# Patient Record
Sex: Female | Born: 1960
Health system: Southern US, Community
[De-identification: ages and names within clinical notes are randomized; demographics above are authoritative.]

## PROBLEM LIST (undated history)

## (undated) DIAGNOSIS — J40 Bronchitis, not specified as acute or chronic: Secondary | ICD-10-CM

## (undated) DIAGNOSIS — K219 Gastro-esophageal reflux disease without esophagitis: Secondary | ICD-10-CM

## (undated) DIAGNOSIS — E739 Lactose intolerance, unspecified: Secondary | ICD-10-CM

## (undated) DIAGNOSIS — K209 Esophagitis, unspecified without bleeding: Secondary | ICD-10-CM

## (undated) DIAGNOSIS — I10 Essential (primary) hypertension: Secondary | ICD-10-CM

## (undated) DIAGNOSIS — Z9889 Other specified postprocedural states: Secondary | ICD-10-CM

## (undated) DIAGNOSIS — R7303 Prediabetes: Secondary | ICD-10-CM

## (undated) DIAGNOSIS — K5900 Constipation, unspecified: Secondary | ICD-10-CM

## (undated) DIAGNOSIS — R112 Nausea with vomiting, unspecified: Secondary | ICD-10-CM

## (undated) DIAGNOSIS — E669 Obesity, unspecified: Secondary | ICD-10-CM

## (undated) DIAGNOSIS — F32A Depression, unspecified: Secondary | ICD-10-CM

## (undated) DIAGNOSIS — J45909 Unspecified asthma, uncomplicated: Secondary | ICD-10-CM

## (undated) DIAGNOSIS — M1990 Unspecified osteoarthritis, unspecified site: Secondary | ICD-10-CM

## (undated) DIAGNOSIS — M2550 Pain in unspecified joint: Secondary | ICD-10-CM

## (undated) DIAGNOSIS — R0602 Shortness of breath: Secondary | ICD-10-CM

## (undated) HISTORY — DX: Lactose intolerance, unspecified: E73.9

## (undated) HISTORY — DX: Pain in unspecified joint: M25.50

## (undated) HISTORY — PX: ANKLE ARTHROSCOPY: SUR85

## (undated) HISTORY — DX: Depression, unspecified: F32.A

## (undated) HISTORY — DX: Shortness of breath: R06.02

## (undated) HISTORY — DX: Constipation, unspecified: K59.00

## (undated) HISTORY — PX: BIOPSY BREAST: PRO8

---

## 2001-06-30 ENCOUNTER — Other Ambulatory Visit: Admission: RE | Admit: 2001-06-30 | Discharge: 2001-06-30 | Payer: Self-pay | Admitting: Obstetrics and Gynecology

## 2001-10-12 ENCOUNTER — Other Ambulatory Visit: Admission: RE | Admit: 2001-10-12 | Discharge: 2001-10-12 | Payer: Self-pay | Admitting: Obstetrics and Gynecology

## 2003-04-18 ENCOUNTER — Other Ambulatory Visit: Admission: RE | Admit: 2003-04-18 | Discharge: 2003-04-18 | Payer: Self-pay | Admitting: Obstetrics and Gynecology

## 2005-05-31 ENCOUNTER — Emergency Department (HOSPITAL_COMMUNITY): Admission: EM | Admit: 2005-05-31 | Discharge: 2005-05-31 | Payer: Self-pay | Admitting: Emergency Medicine

## 2015-01-06 ENCOUNTER — Other Ambulatory Visit (HOSPITAL_COMMUNITY): Payer: Self-pay | Admitting: Orthopedic Surgery

## 2015-01-09 ENCOUNTER — Other Ambulatory Visit (HOSPITAL_COMMUNITY): Payer: Self-pay | Admitting: Orthopedic Surgery

## 2015-01-09 ENCOUNTER — Encounter (HOSPITAL_COMMUNITY)
Admission: RE | Admit: 2015-01-09 | Discharge: 2015-01-09 | Disposition: A | Discharge status: Home / Self Care | Payer: 59 | Source: Ambulatory Visit | Attending: Orthopedic Surgery | Admitting: Orthopedic Surgery

## 2015-01-09 ENCOUNTER — Encounter (HOSPITAL_COMMUNITY): Payer: Self-pay

## 2015-01-09 DIAGNOSIS — M12571 Traumatic arthropathy, right ankle and foot: Secondary | ICD-10-CM | POA: Diagnosis not present

## 2015-01-09 DIAGNOSIS — E669 Obesity, unspecified: Secondary | ICD-10-CM | POA: Diagnosis not present

## 2015-01-09 DIAGNOSIS — M24171 Other articular cartilage disorders, right ankle: Secondary | ICD-10-CM | POA: Diagnosis not present

## 2015-01-09 DIAGNOSIS — I10 Essential (primary) hypertension: Secondary | ICD-10-CM | POA: Diagnosis not present

## 2015-01-09 DIAGNOSIS — M65871 Other synovitis and tenosynovitis, right ankle and foot: Secondary | ICD-10-CM | POA: Diagnosis not present

## 2015-01-09 DIAGNOSIS — M25571 Pain in right ankle and joints of right foot: Secondary | ICD-10-CM | POA: Diagnosis present

## 2015-01-09 DIAGNOSIS — M7751 Other enthesopathy of right foot: Secondary | ICD-10-CM | POA: Diagnosis not present

## 2015-01-09 DIAGNOSIS — J45909 Unspecified asthma, uncomplicated: Secondary | ICD-10-CM | POA: Diagnosis not present

## 2015-01-09 DIAGNOSIS — M25871 Other specified joint disorders, right ankle and foot: Secondary | ICD-10-CM | POA: Diagnosis not present

## 2015-01-09 DIAGNOSIS — Z87891 Personal history of nicotine dependence: Secondary | ICD-10-CM | POA: Diagnosis not present

## 2015-01-09 DIAGNOSIS — Z6841 Body Mass Index (BMI) 40.0 and over, adult: Secondary | ICD-10-CM | POA: Diagnosis not present

## 2015-01-09 HISTORY — DX: Esophagitis, unspecified without bleeding: K20.90

## 2015-01-09 HISTORY — DX: Other specified postprocedural states: R11.2

## 2015-01-09 HISTORY — DX: Bronchitis, not specified as acute or chronic: J40

## 2015-01-09 HISTORY — DX: Obesity, unspecified: E66.9

## 2015-01-09 HISTORY — DX: Other specified postprocedural states: Z98.890

## 2015-01-09 HISTORY — DX: Esophagitis, unspecified: K20.9

## 2015-01-09 HISTORY — DX: Essential (primary) hypertension: I10

## 2015-01-09 HISTORY — DX: Unspecified osteoarthritis, unspecified site: M19.90

## 2015-01-09 HISTORY — DX: Unspecified asthma, uncomplicated: J45.909

## 2015-01-09 LAB — COMPREHENSIVE METABOLIC PANEL
ALT: 26 U/L (ref 0–35)
AST: 23 U/L (ref 0–37)
Albumin: 3.5 g/dL (ref 3.5–5.2)
Alkaline Phosphatase: 76 U/L (ref 39–117)
Anion gap: 14 (ref 5–15)
BUN: 19 mg/dL (ref 6–23)
CO2: 24 mmol/L (ref 19–32)
Calcium: 9.6 mg/dL (ref 8.4–10.5)
Chloride: 104 mmol/L (ref 96–112)
Creatinine, Ser: 0.95 mg/dL (ref 0.50–1.10)
GFR calc Af Amer: 77 mL/min — ABNORMAL LOW (ref >90)
GFR calc non Af Amer: 67 mL/min — ABNORMAL LOW (ref >90)
Glucose, Bld: 96 mg/dL (ref 70–99)
Potassium: 4.1 mmol/L (ref 3.5–5.1)
Sodium: 142 mmol/L (ref 135–145)
Total Bilirubin: 0.4 mg/dL (ref 0.3–1.2)
Total Protein: 7 g/dL (ref 6.0–8.3)

## 2015-01-09 LAB — CBC
HCT: 40.7 % (ref 36.0–46.0)
Hemoglobin: 13.3 g/dL (ref 12.0–15.0)
MCH: 28 pg (ref 26.0–34.0)
MCHC: 32.7 g/dL (ref 30.0–36.0)
MCV: 85.7 fL (ref 78.0–100.0)
Platelets: 340 10*3/uL (ref 150–400)
RBC: 4.75 MIL/uL (ref 3.87–5.11)
RDW: 14.4 % (ref 11.5–15.5)
WBC: 11.1 10*3/uL — ABNORMAL HIGH (ref 4.0–10.5)

## 2015-01-09 LAB — APTT: aPTT: 32 seconds (ref 24–37)

## 2015-01-09 LAB — PROTIME-INR
INR: 1 (ref 0.00–1.49)
Prothrombin Time: 13.3 seconds (ref 11.6–15.2)

## 2015-01-09 NOTE — Progress Notes (Signed)
   01/09/15 1425  OBSTRUCTIVE SLEEP APNEA  Have you ever been diagnosed with sleep apnea through a sleep study? No  Do you snore loudly (loud enough to be heard through closed doors)?  0  Do you often feel tired, fatigued, or sleepy during the daytime? 0  Has anyone observed you stop breathing during your sleep? 0  Do you have, or are you being treated for high blood pressure? 1  BMI more than 35 kg/m2? 1  Age over 55 years old? 1  Neck circumference greater than 40 cm/16 inches? 1  Gender: 0

## 2015-01-09 NOTE — Pre-Procedure Instructions (Signed)
Sarah Dodson  01/09/2015   Your procedure is scheduled on:  Wednesday, April 6th   Report to [facility] Admitting at 8:30 AM.  Call this number if you have problems the morning of surgery: [phone]   Remember:   Do not eat food or drink liquids after midnight Tuesday.    Take these medicines the morning of surgery with A SIP OF WATER:    Do not wear jewelry, make-up or nail polish.  Do not wear lotions, powders, or perfumes. You may NOT wear deodorant the day of surgery.  Do not shave underarms & legs 48 hours prior to surgery.    Do not bring valuables to the hospital.  [facility] is not responsible for any belongings or valuables.               Contacts, dentures or bridgework may not be worn into surgery.  Leave suitcase in the car. After surgery it may be brought to your room.                 Patients discharged the day of surgery will not be allowed to drive home.   Name and phone number of your driver:    Special Instructions: "Preparing for Surgery" instruction sheet.   Please read over the following fact sheets that you were given: Pain Booklet, Coughing and Deep Breathing, MRSA Information and Surgical Site Infection Prevention

## 2015-01-10 MED ORDER — CEFAZOLIN SODIUM-DEXTROSE 2-3 GM-% IV SOLR
2.0000 g | INTRAVENOUS | Status: AC
  Administered 2015-01-11: 2 g via INTRAVENOUS
  Filled 2015-01-10: qty 50

## 2015-01-11 ENCOUNTER — Ambulatory Visit (HOSPITAL_COMMUNITY): Payer: 59 | Admitting: Anesthesiology

## 2015-01-11 ENCOUNTER — Encounter (HOSPITAL_COMMUNITY): Payer: Self-pay | Admitting: *Deleted

## 2015-01-11 ENCOUNTER — Ambulatory Visit (HOSPITAL_COMMUNITY)
Admission: RE | Admit: 2015-01-11 | Discharge: 2015-01-11 | Disposition: A | Discharge status: Home / Self Care | Payer: 59 | Source: Ambulatory Visit | Attending: Orthopedic Surgery | Admitting: Orthopedic Surgery

## 2015-01-11 ENCOUNTER — Encounter (HOSPITAL_COMMUNITY)
Admission: RE | Disposition: A | Discharge status: Home / Self Care | Payer: Self-pay | Source: Ambulatory Visit | Attending: Orthopedic Surgery

## 2015-01-11 DIAGNOSIS — M12571 Traumatic arthropathy, right ankle and foot: Secondary | ICD-10-CM | POA: Insufficient documentation

## 2015-01-11 DIAGNOSIS — I10 Essential (primary) hypertension: Secondary | ICD-10-CM | POA: Insufficient documentation

## 2015-01-11 DIAGNOSIS — M7751 Other enthesopathy of right foot: Secondary | ICD-10-CM | POA: Insufficient documentation

## 2015-01-11 DIAGNOSIS — M19071 Primary osteoarthritis, right ankle and foot: Secondary | ICD-10-CM

## 2015-01-11 DIAGNOSIS — Z6841 Body Mass Index (BMI) 40.0 and over, adult: Secondary | ICD-10-CM | POA: Insufficient documentation

## 2015-01-11 DIAGNOSIS — M24171 Other articular cartilage disorders, right ankle: Secondary | ICD-10-CM | POA: Insufficient documentation

## 2015-01-11 DIAGNOSIS — M25871 Other specified joint disorders, right ankle and foot: Secondary | ICD-10-CM | POA: Insufficient documentation

## 2015-01-11 DIAGNOSIS — Z87891 Personal history of nicotine dependence: Secondary | ICD-10-CM | POA: Insufficient documentation

## 2015-01-11 DIAGNOSIS — J45909 Unspecified asthma, uncomplicated: Secondary | ICD-10-CM | POA: Insufficient documentation

## 2015-01-11 DIAGNOSIS — M65871 Other synovitis and tenosynovitis, right ankle and foot: Secondary | ICD-10-CM | POA: Insufficient documentation

## 2015-01-11 DIAGNOSIS — E669 Obesity, unspecified: Secondary | ICD-10-CM | POA: Insufficient documentation

## 2015-01-11 HISTORY — PX: ANKLE ARTHROSCOPY: SHX545

## 2015-01-11 LAB — SURGICAL PCR SCREEN
MRSA, PCR: NEGATIVE
Staphylococcus aureus: NEGATIVE

## 2015-01-11 SURGERY — ARTHROSCOPY, ANKLE
Anesthesia: General | Laterality: Right

## 2015-01-11 MED ORDER — PROPOFOL 10 MG/ML IV BOLUS
INTRAVENOUS | Status: AC
  Filled 2015-01-11: qty 20

## 2015-01-11 MED ORDER — FENTANYL CITRATE 0.05 MG/ML IJ SOLN
INTRAMUSCULAR | Status: DC | PRN
  Administered 2015-01-11 (×2): 50 ug via INTRAVENOUS
  Administered 2015-01-11: 100 ug via INTRAVENOUS

## 2015-01-11 MED ORDER — ONDANSETRON HCL 4 MG/2ML IJ SOLN
INTRAMUSCULAR | Status: AC
  Filled 2015-01-11: qty 2

## 2015-01-11 MED ORDER — ARTIFICIAL TEARS OP OINT
TOPICAL_OINTMENT | OPHTHALMIC | Status: AC
  Filled 2015-01-11: qty 3.5

## 2015-01-11 MED ORDER — MIDAZOLAM HCL 2 MG/2ML IJ SOLN
INTRAMUSCULAR | Status: AC
  Administered 2015-01-11: 2 mg
  Filled 2015-01-11: qty 2

## 2015-01-11 MED ORDER — FENTANYL CITRATE 0.05 MG/ML IJ SOLN: 25.0000 ug | INTRAMUSCULAR | Status: DC | PRN

## 2015-01-11 MED ORDER — BUPIVACAINE HCL (PF) 0.25 % IJ SOLN
INTRAMUSCULAR | Status: AC
  Filled 2015-01-11: qty 30

## 2015-01-11 MED ORDER — SODIUM CHLORIDE 0.9 % IJ SOLN
INTRAMUSCULAR | Status: AC
  Filled 2015-01-11: qty 10

## 2015-01-11 MED ORDER — LIDOCAINE HCL (CARDIAC) 20 MG/ML IV SOLN
INTRAVENOUS | Status: DC | PRN
  Administered 2015-01-11: 50 mg via INTRAVENOUS

## 2015-01-11 MED ORDER — KETOROLAC TROMETHAMINE 30 MG/ML IJ SOLN
30.0000 mg | Freq: Once | INTRAMUSCULAR | Status: AC | PRN
  Administered 2015-01-11: 30 mg via INTRAVENOUS

## 2015-01-11 MED ORDER — DEXAMETHASONE SODIUM PHOSPHATE 10 MG/ML IJ SOLN
INTRAMUSCULAR | Status: DC | PRN
  Administered 2015-01-11: 10 mg via INTRAVENOUS

## 2015-01-11 MED ORDER — SUCCINYLCHOLINE CHLORIDE 20 MG/ML IJ SOLN
INTRAMUSCULAR | Status: AC
  Filled 2015-01-11: qty 1

## 2015-01-11 MED ORDER — SODIUM CHLORIDE 0.9 % IR SOLN
Status: DC | PRN
  Administered 2015-01-11 (×2): 3000 mL

## 2015-01-11 MED ORDER — LACTATED RINGERS IV SOLN
INTRAVENOUS | Status: DC
  Administered 2015-01-11: 10:00:00 via INTRAVENOUS

## 2015-01-11 MED ORDER — KETOROLAC TROMETHAMINE 30 MG/ML IJ SOLN
INTRAMUSCULAR | Status: AC
  Filled 2015-01-11: qty 1

## 2015-01-11 MED ORDER — SUCCINYLCHOLINE CHLORIDE 20 MG/ML IJ SOLN
INTRAMUSCULAR | Status: DC | PRN
  Administered 2015-01-11: 120 mg via INTRAVENOUS

## 2015-01-11 MED ORDER — MIDAZOLAM HCL 2 MG/2ML IJ SOLN
INTRAMUSCULAR | Status: AC
  Filled 2015-01-11: qty 2

## 2015-01-11 MED ORDER — ONDANSETRON HCL 4 MG/2ML IJ SOLN: 4.0000 mg | Freq: Once | INTRAMUSCULAR | Status: DC | PRN

## 2015-01-11 MED ORDER — OXYCODONE HCL 5 MG/5ML PO SOLN: 5.0000 mg | Freq: Once | ORAL | Status: DC | PRN

## 2015-01-11 MED ORDER — PROPOFOL 10 MG/ML IV BOLUS
INTRAVENOUS | Status: DC | PRN
  Administered 2015-01-11: 200 mg via INTRAVENOUS
  Administered 2015-01-11: 100 mg via INTRAVENOUS

## 2015-01-11 MED ORDER — EPHEDRINE SULFATE 50 MG/ML IJ SOLN
INTRAMUSCULAR | Status: AC
  Filled 2015-01-11: qty 1

## 2015-01-11 MED ORDER — ONDANSETRON HCL 4 MG/2ML IJ SOLN
INTRAMUSCULAR | Status: DC | PRN
  Administered 2015-01-11: 4 mg via INTRAVENOUS

## 2015-01-11 MED ORDER — MUPIROCIN 2 % EX OINT
1.0000 "application " | TOPICAL_OINTMENT | Freq: Once | CUTANEOUS | Status: AC
  Administered 2015-01-11: 1 via TOPICAL
  Filled 2015-01-11: qty 22

## 2015-01-11 MED ORDER — LACTATED RINGERS IV SOLN
INTRAVENOUS | Status: DC | PRN
  Administered 2015-01-11 (×2): via INTRAVENOUS

## 2015-01-11 MED ORDER — OXYCODONE HCL 5 MG PO TABS: 5.0000 mg | ORAL_TABLET | Freq: Once | ORAL | Status: DC | PRN

## 2015-01-11 MED ORDER — FENTANYL CITRATE 0.05 MG/ML IJ SOLN
INTRAMUSCULAR | Status: AC
  Administered 2015-01-11: 100 ug
  Filled 2015-01-11: qty 2

## 2015-01-11 MED ORDER — FENTANYL CITRATE 0.05 MG/ML IJ SOLN
INTRAMUSCULAR | Status: AC
  Filled 2015-01-11: qty 2

## 2015-01-11 MED ORDER — LIDOCAINE HCL (CARDIAC) 20 MG/ML IV SOLN
INTRAVENOUS | Status: AC
  Filled 2015-01-11: qty 5

## 2015-01-11 MED ORDER — FENTANYL CITRATE 0.05 MG/ML IJ SOLN
INTRAMUSCULAR | Status: AC
  Filled 2015-01-11: qty 5

## 2015-01-11 SURGICAL SUPPLY — 44 items
BLADE CUDA 5.5 (BLADE) IMPLANT
BLADE GREAT WHITE 4.2 (BLADE) IMPLANT
BLADE INCISOR PLUS 5.5 (BLADE) IMPLANT
BNDG COHESIVE 6X5 TAN STRL LF (GAUZE/BANDAGES/DRESSINGS) ×2 IMPLANT
BNDG GAUZE ELAST 4 BULKY (GAUZE/BANDAGES/DRESSINGS) ×2 IMPLANT
BUR OVAL 6.0 (BURR) IMPLANT
DRAPE ARTHROSCOPY W/POUCH 114 (DRAPES) ×2 IMPLANT
DRAPE U-SHAPE 47X51 STRL (DRAPES) ×2 IMPLANT
DRSG ADAPTIC 3X8 NADH LF (GAUZE/BANDAGES/DRESSINGS) ×2 IMPLANT
DRSG EMULSION OIL 3X3 NADH (GAUZE/BANDAGES/DRESSINGS) ×2 IMPLANT
DRSG PAD ABDOMINAL 8X10 ST (GAUZE/BANDAGES/DRESSINGS) ×2 IMPLANT
DURAPREP 26ML APPLICATOR (WOUND CARE) ×2 IMPLANT
GAUZE SPONGE 4X4 12PLY STRL (GAUZE/BANDAGES/DRESSINGS) ×2 IMPLANT
GLOVE BIOGEL PI IND STRL 6.5 (GLOVE) ×1 IMPLANT
GLOVE BIOGEL PI IND STRL 9 (GLOVE) ×1 IMPLANT
GLOVE BIOGEL PI INDICATOR 6.5 (GLOVE) ×1
GLOVE BIOGEL PI INDICATOR 9 (GLOVE) ×1
GLOVE SURG ORTHO 9.0 STRL STRW (GLOVE) ×2 IMPLANT
GLOVE SURG SS PI 6.5 STRL IVOR (GLOVE) ×2 IMPLANT
GOWN STRL REUS W/ TWL LRG LVL3 (GOWN DISPOSABLE) ×2 IMPLANT
GOWN STRL REUS W/ TWL XL LVL3 (GOWN DISPOSABLE) ×3 IMPLANT
GOWN STRL REUS W/TWL LRG LVL3 (GOWN DISPOSABLE) ×4
GOWN STRL REUS W/TWL XL LVL3 (GOWN DISPOSABLE) ×6
KIT BASIN OR (CUSTOM PROCEDURE TRAY) ×2 IMPLANT
KIT ROOM TURNOVER OR (KITS) ×2 IMPLANT
MANIFOLD NEPTUNE II (INSTRUMENTS) ×2 IMPLANT
NEEDLE 18GX1X1/2 (RX/OR ONLY) (NEEDLE) ×2 IMPLANT
PACK ARTHROSCOPY DSU (CUSTOM PROCEDURE TRAY) ×2 IMPLANT
PAD ARMBOARD 7.5X6 YLW CONV (MISCELLANEOUS) ×4 IMPLANT
PADDING CAST COTTON 6X4 STRL (CAST SUPPLIES) ×2 IMPLANT
SET ARTHROSCOPY TUBING (MISCELLANEOUS) ×2
SET ARTHROSCOPY TUBING LN (MISCELLANEOUS) ×1 IMPLANT
SPONGE LAP 4X18 X RAY DECT (DISPOSABLE) ×2 IMPLANT
SUT ETHILON 4 0 PS 2 18 (SUTURE) ×2 IMPLANT
SUT MNCRL AB 3-0 PS2 18 (SUTURE) IMPLANT
SUT VIC AB 2-0 CT1 27 (SUTURE)
SUT VIC AB 2-0 CT1 TAPERPNT 27 (SUTURE) IMPLANT
SYR 20CC LL (SYRINGE) ×2 IMPLANT
TAPE STRIPS DRAPE STRL (GAUZE/BANDAGES/DRESSINGS) IMPLANT
TOWEL OR 17X24 6PK STRL BLUE (TOWEL DISPOSABLE) ×2 IMPLANT
TOWEL OR 17X26 10 PK STRL BLUE (TOWEL DISPOSABLE) ×2 IMPLANT
WAND 30 DEG SABER W/CORD (SURGICAL WAND) IMPLANT
WATER STERILE IRR 1000ML POUR (IV SOLUTION) ×2 IMPLANT
WRAP KNEE MAXI GEL POST OP (GAUZE/BANDAGES/DRESSINGS) ×2 IMPLANT

## 2015-01-11 NOTE — Anesthesia Postprocedure Evaluation (Signed)
  Anesthesia Post-op Note  Patient: Sarah Dodson  Procedure(s) Performed: Procedure(s): RIGHT ANKLE ARTHROSCOPY (Right)  Patient Location: PACU  Anesthesia Type:General and GA combined with regional for post-op pain  Level of Consciousness: awake, alert  and oriented  Airway and Oxygen Therapy: Patient Spontanous Breathing and Patient connected to nasal cannula oxygen  Post-op Pain: none  Post-op Assessment: Post-op Vital signs reviewed, Patient's Cardiovascular Status Stable, Respiratory Function Stable, Patent Airway and Pain level controlled  Post-op Vital Signs: stable  Last Vitals:  Filed Vitals:   01/11/15 1145  BP: 143/65  Pulse: 93  Temp:   Resp: 20    Complications: No apparent anesthesia complications

## 2015-01-11 NOTE — H&P (Signed)
Sarah Dodson is an 54 y.o. female.   Chief Complaint: Traumatic arthritic pain right ankle HPI: Patient is a 54 year old woman with traumatic arthritic pain of her right ankle she has failed conservative therapy and presents at this time for arthroscopic intervention.  Past Medical History  Diagnosis Date  . PONV (postoperative nausea and vomiting)     nausea with the LEFT ankle surgery  . Asthma   . Esophagitis   . Arthritis   . Hypertension   . Obesity   . Bronchitis     Past Surgical History  Procedure Laterality Date  . Ankle arthroscopy      2014    No family history on file. Social History:  reports that she quit smoking about 6 years ago. She does not have any smokeless tobacco history on file. She reports that she does not drink alcohol or use illicit drugs.  Allergies:  Allergies  Allergen Reactions  . Latex Itching    No prescriptions prior to admission    Results for orders placed or performed during the hospital encounter of 01/09/15 (from the past 48 hour(s))  APTT     Status: None   Collection Time: 01/09/15  2:53 PM  Result Value Ref Range   aPTT 32 24 - 37 seconds  CBC     Status: Abnormal   Collection Time: 01/09/15  2:53 PM  Result Value Ref Range   WBC 11.1 (H) 4.0 - 10.5 K/uL   RBC 4.75 3.87 - 5.11 MIL/uL   Hemoglobin 13.3 12.0 - 15.0 g/dL   HCT 40.7 36.0 - 46.0 %   MCV 85.7 78.0 - 100.0 fL   MCH 28.0 26.0 - 34.0 pg   MCHC 32.7 30.0 - 36.0 g/dL   RDW 14.4 11.5 - 15.5 %   Platelets 340 150 - 400 K/uL  Comprehensive metabolic panel     Status: Abnormal   Collection Time: 01/09/15  2:53 PM  Result Value Ref Range   Sodium 142 135 - 145 mmol/L   Potassium 4.1 3.5 - 5.1 mmol/L   Chloride 104 96 - 112 mmol/L   CO2 24 19 - 32 mmol/L   Glucose, Bld 96 70 - 99 mg/dL   BUN 19 6 - 23 mg/dL   Creatinine, Ser 0.95 0.50 - 1.10 mg/dL   Calcium 9.6 8.4 - 10.5 mg/dL   Total Protein 7.0 6.0 - 8.3 g/dL   Albumin 3.5 3.5 - 5.2 g/dL   AST 23 0 - 37 U/L    ALT 26 0 - 35 U/L   Alkaline Phosphatase 76 39 - 117 U/L   Total Bilirubin 0.4 0.3 - 1.2 mg/dL   GFR calc non Af Amer 67 (L) >90 mL/min   GFR calc Af Amer 77 (L) >90 mL/min    Comment: (NOTE) The eGFR has been calculated using the CKD EPI equation. This calculation has not been validated in all clinical situations. eGFR's persistently <90 mL/min signify possible Chronic Kidney Disease.    Anion gap 14 5 - 15  Protime-INR     Status: None   Collection Time: 01/09/15  2:53 PM  Result Value Ref Range   Prothrombin Time 13.3 11.6 - 15.2 seconds   INR 1.00 0.00 - 1.49   No results found.  Review of Systems  All other systems reviewed and are negative.   There were no vitals taken for this visit. Physical Exam  On examination patient has a palpable pulse. She has pain to palpation anteriorly  over the ankle. Maximum plantarflexion and dorsiflexion of the ankle reproduces pain. Assessment/Plan Assessment: Traumatic arthritis right ankle.  Plan: We will plan for right ankle arthroscopy. Risks and benefits were discussed including infection neurovascular injury persistent pain and need for additional surgery. Patient states she understands and wishes to proceed at this time.  Sarah Dodson V 01/11/2015, 6:49 AM

## 2015-01-11 NOTE — Anesthesia Preprocedure Evaluation (Addendum)
Anesthesia Evaluation  Patient identified by MRN, date of birth, ID band Patient awake    Reviewed: Allergy & Precautions, H&P , NPO status , Patient's Chart, lab work & pertinent test results  History of Anesthesia Complications (+) PONV  Airway Mallampati: II  TM Distance: >3 FB Neck ROM: full    Dental  (+) Teeth Intact, Dental Advidsory Given   Pulmonary asthma , former smoker,          Cardiovascular hypertension, Rhythm:Regular Rate:Normal     Neuro/Psych    GI/Hepatic   Endo/Other    Renal/GU      Musculoskeletal   Abdominal (+) + obese,   Peds  Hematology   Anesthesia Other Findings   Reproductive/Obstetrics                            Anesthesia Physical Anesthesia Plan  ASA: III  Anesthesia Plan: General   Post-op Pain Management: MAC Combined w/ Regional for Post-op pain   Induction: Intravenous  Airway Management Planned: Oral ETT  Additional Equipment:   Intra-op Plan:   Post-operative Plan:   Informed Consent: I have reviewed the patients History and Physical, chart, labs and discussed the procedure including the risks, benefits and alternatives for the proposed anesthesia with the patient or authorized representative who has indicated his/her understanding and acceptance.   Dental Advisory Given  Plan Discussed with: Anesthesiologist and Surgeon  Anesthesia Plan Comments:        Anesthesia Quick Evaluation

## 2015-01-11 NOTE — Op Note (Signed)
01/11/2015  10:55 AM  PATIENT:  Sarah Dodson    PRE-OPERATIVE DIAGNOSIS:  Impingement Right Ankle  POST-OPERATIVE DIAGNOSIS:  Same  PROCEDURE:  RIGHT ANKLE ARTHROSCOPY  SURGEON:  Newt Minion, MD  PHYSICIAN ASSISTANT:None ANESTHESIA:   General  PREOPERATIVE INDICATIONS:  Sarah Dodson is a  54 y.o. female with a diagnosis of Impingement Right Ankle who failed conservative measures and elected for surgical management.    The risks benefits and alternatives were discussed with the patient preoperatively including but not limited to the risks of infection, bleeding, nerve injury, cardiopulmonary complications, the need for revision surgery, among others, and the patient was willing to proceed.  OPERATIVE IMPLANTS: None  OPERATIVE FINDINGS: Traumatic arthritis with large osteophytic bone spurs  OPERATIVE PROCEDURE: Patient is a 54 year old woman with traumatic arthritis right ankle with impingement syndrome she has failed conservative care and presents at this time for arthroscopic intervention. Risks and benefits were discussed including infection neurovascular injury persistent pain and need for additional surgery. Patient states she understands and wishes to proceed at this time. Patient was brought to the operating room after undergoing a popliteal block she then underwent a general anesthetic. After adequate levels of anesthesia were obtained patient's right lower extremity was prepped using DuraPrep draped into a sterile field. A timeout was called. A arthroscopic portal was made anterior medially and a working portal was made anterior laterally. Blunt dissection was carried down to the capsule and a blunt trocar was used to insert into the joint. Arthroscopic findings showed large osteophytic bone spurs anteriorly with significant synovitis. Patient underwent synovectomy with both the shaver and the vapor wand. The bony spurs were resected with the shaver. Patient did have osteochondral  changes of the tibial talar joint. Survey of all compartments was performed and there are no other abnormalities. The incidence remove the portals were closed using 3-0 nylon. A sterile compressive dressing was applied. Patient was extubated taken to the PACU in stable condition. Plan for discharge to home follow-up in the office in 2 weeks

## 2015-01-11 NOTE — Transfer of Care (Signed)
Immediate Anesthesia Transfer of Care Note  Patient: Sarah Dodson  Procedure(s) Performed: Procedure(s): RIGHT ANKLE ARTHROSCOPY (Right)  Patient Location: PACU  Anesthesia Type:General  Level of Consciousness: awake, alert  and oriented  Airway & Oxygen Therapy: Patient Spontanous Breathing and Patient connected to face mask oxygen  Post-op Assessment: Report given to RN, Post -op Vital signs reviewed and stable and Patient moving all extremities X 4  Post vital signs: Reviewed and stable  Last Vitals:  Filed Vitals:   01/11/15 0918  BP: 139/68  Pulse: 85  Temp: 36.9 C  Resp: 18    Complications: No apparent anesthesia complications

## 2015-01-11 NOTE — Progress Notes (Signed)
Orthopedic Tech Progress Note Patient Details:  Sarah Dodson 1961/04/23 [id number]  Ortho Devices Type of Ortho Device: Postop shoe/boot Ortho Device/Splint Location: rle Ortho Device/Splint Interventions: Application  Viewed order from doctor's order list Hildred Priest 01/11/2015, 11:53 AM

## 2015-01-11 NOTE — Anesthesia Procedure Notes (Addendum)
Procedure Name: Intubation Date/Time: 01/11/2015 10:11 AM Performed by: Neldon Newport Pre-anesthesia Checklist: Timeout performed, Patient being monitored, Suction available, Emergency Drugs available and Patient identified Patient Re-evaluated:Patient Re-evaluated prior to inductionOxygen Delivery Method: Circle system utilized Preoxygenation: Pre-oxygenation with 100% oxygen Intubation Type: IV induction and Rapid sequence Ventilation: Mask ventilation without difficulty Laryngoscope Size: Mac and 3 Grade View: Grade I Tube type: Oral Tube size: 7.0 mm Number of attempts: 1 Secured at: 21 cm Tube secured with: Tape Dental Injury: Teeth and Oropharynx as per pre-operative assessment    Anesthesia Regional Block:  Popliteal block  Pre-Anesthetic Checklist: ,, timeout performed, Correct Patient, Correct Site, Correct Laterality, Correct Procedure, Correct Position, site marked, Risks and benefits discussed,  Surgical consent,  Pre-op evaluation,  At surgeon's request and post-op pain management  Laterality: Right  Prep: chloraprep       Needles:  Injection technique: Single-shot  Needle Type: Echogenic Stimulator Needle     Needle Length: 9cm 9 cm Needle Gauge: 22 and 22 G    Additional Needles:  Procedures: ultrasound guided (picture in chart) and nerve stimulator Popliteal block Narrative:  Start time: 01/11/2015 9:45 AM End time: 01/11/2015 9:50 AM Injection made incrementally with aspirations every 5 mL.  Performed by: Personally   Additional Notes: 30 cc 0.5% Bupivacaine with 1:200 epinephrine injected easily

## 2015-01-12 ENCOUNTER — Encounter (HOSPITAL_COMMUNITY): Payer: Self-pay | Admitting: Orthopedic Surgery

## 2016-07-22 ENCOUNTER — Ambulatory Visit (INDEPENDENT_AMBULATORY_CARE_PROVIDER_SITE_OTHER): Payer: 59 | Admitting: Orthopedic Surgery

## 2016-07-22 DIAGNOSIS — M19071 Primary osteoarthritis, right ankle and foot: Secondary | ICD-10-CM

## 2016-07-22 DIAGNOSIS — M7541 Impingement syndrome of right shoulder: Secondary | ICD-10-CM

## 2016-07-22 DIAGNOSIS — M1711 Unilateral primary osteoarthritis, right knee: Secondary | ICD-10-CM | POA: Diagnosis not present

## 2016-07-30 ENCOUNTER — Other Ambulatory Visit (INDEPENDENT_AMBULATORY_CARE_PROVIDER_SITE_OTHER): Payer: Self-pay | Admitting: Orthopedic Surgery

## 2016-08-07 ENCOUNTER — Other Ambulatory Visit: Payer: Self-pay | Admitting: Obstetrics and Gynecology

## 2016-08-07 DIAGNOSIS — R928 Other abnormal and inconclusive findings on diagnostic imaging of breast: Secondary | ICD-10-CM

## 2016-08-09 ENCOUNTER — Ambulatory Visit
Admission: RE | Admit: 2016-08-09 | Discharge: 2016-08-09 | Disposition: A | Discharge status: Home / Self Care | Payer: 59 | Source: Ambulatory Visit | Attending: Obstetrics and Gynecology | Admitting: Obstetrics and Gynecology

## 2016-08-09 ENCOUNTER — Other Ambulatory Visit: Payer: Self-pay | Admitting: Obstetrics and Gynecology

## 2016-08-09 DIAGNOSIS — R928 Other abnormal and inconclusive findings on diagnostic imaging of breast: Secondary | ICD-10-CM

## 2016-08-09 DIAGNOSIS — N6310 Unspecified lump in the right breast, unspecified quadrant: Secondary | ICD-10-CM

## 2016-08-13 NOTE — H&P (Signed)
Sarah Dodson  DICTATION # [id number] CSN# [id number]   Margarette Asal, MD 08/13/2016 11:26 AM

## 2016-08-14 NOTE — H&P (Signed)
Sarah Dodson, Sarah Dodson               ACCOUNT NO.:  [id number]  MEDICAL RECORD NO.:  [id number]  LOCATION:                                 FACILITY:  PHYSICIAN:  Ralene Bathe. Matthew Saras, M.D.DATE OF BIRTH:  [birth year]  DATE OF ADMISSION: DATE OF DISCHARGE:                             HISTORY & PHYSICAL   CHIEF COMPLAINT:  Postmenopausal bleeding.  HISTORY OF PRESENT ILLNESS:  A 55 year old, postmenopausal, G2, P0, who was seen in October of this year for her annual exam thinking that she was having some rectal bleeding from hemorrhoid, small amount of blood was noted at the cervical os.  So, further evaluation was carried out in the form of sonohysterogram.  Visualization was difficult somewhat due to her weight of 335 pounds.  It did, however, looked like there was some buildup of the endometrium, possibly a small polyp noted.  She presents at this time for outpatient D and C, hysteroscopy.  This procedure including specific risks related to bleeding, infection, adjacent organ injury, the possible need for open additional surgery reviewed with her.  PAST MEDICAL HISTORY:  ALLERGIES:  Latex.  CURRENT MEDICATIONS:  Triamterene/HCTZ, diclofenac daily, Advair daily.  SURGICAL HISTORY:  She has had arthroscopy on both ankles.  REVIEW OF SYSTEMS:  Significant for history of chlamydia treated in the past.  She has a history of positive titers for HSV 1 and 2.  FAMILY HISTORY:  Significant for asthma, arthritis, hypertension.  SOCIAL HISTORY:  She is divorced.  Denies alcohol, tobacco, or drug use.  Sarah Dodson is her PCP.  Last Pap dated October 17 was negative except for possible BV.  PHYSICAL EXAMINATION:  VITAL SIGNS:  Temp 98.2, blood pressure 130/80. Weight is 335. HEENT:  Unremarkable. NECK:  Supple.  No masses. LUNGS:  Clear. CARDIOVASCULAR:  Regular rate and rhythm without murmurs, rubs, or gallops noted. BREASTS:  Without masses. ABDOMEN:  Soft, flat,  nontender. PELVIC:  Vulva, vagina, cervix normal except for small amount of blood as noted previously.  Exam difficult due to her weight, but no obvious masses. EXTREMITIES:  Unremarkable. NEUROLOGIC:  Unremarkable.  IMPRESSION:  Postmenopausal bleeding, endometrial buildup noted on SHG.  PLAN:  D and C, hysteroscopy.  Procedure and risks reviewed as above.     Sarah Dodson M. Matthew Saras, M.D.   ______________________________ Ralene Bathe. Matthew Saras, M.D.    [facility]/MEDQ  D:  08/13/2016  T:  08/14/2016  Job:  [id number]

## 2016-08-15 ENCOUNTER — Ambulatory Visit
Admission: RE | Admit: 2016-08-15 | Discharge: 2016-08-15 | Disposition: A | Discharge status: Home / Self Care | Payer: 59 | Source: Ambulatory Visit | Attending: Obstetrics and Gynecology | Admitting: Obstetrics and Gynecology

## 2016-08-15 ENCOUNTER — Other Ambulatory Visit: Payer: Self-pay | Admitting: Obstetrics and Gynecology

## 2016-08-15 DIAGNOSIS — N6310 Unspecified lump in the right breast, unspecified quadrant: Secondary | ICD-10-CM

## 2016-08-19 ENCOUNTER — Ambulatory Visit (INDEPENDENT_AMBULATORY_CARE_PROVIDER_SITE_OTHER): Payer: 59 | Admitting: Orthopedic Surgery

## 2016-08-28 ENCOUNTER — Encounter (HOSPITAL_COMMUNITY)
Admission: RE | Admit: 2016-08-28 | Discharge: 2016-08-28 | Disposition: A | Discharge status: Home / Self Care | Payer: 59 | Source: Ambulatory Visit | Attending: Obstetrics and Gynecology | Admitting: Obstetrics and Gynecology

## 2016-08-28 ENCOUNTER — Encounter (HOSPITAL_COMMUNITY): Payer: Self-pay

## 2016-08-28 ENCOUNTER — Other Ambulatory Visit: Payer: Self-pay

## 2016-08-28 DIAGNOSIS — Z01812 Encounter for preprocedural laboratory examination: Secondary | ICD-10-CM | POA: Insufficient documentation

## 2016-08-28 DIAGNOSIS — K649 Unspecified hemorrhoids: Secondary | ICD-10-CM | POA: Insufficient documentation

## 2016-08-28 DIAGNOSIS — Z79899 Other long term (current) drug therapy: Secondary | ICD-10-CM | POA: Insufficient documentation

## 2016-08-28 DIAGNOSIS — Z888 Allergy status to other drugs, medicaments and biological substances status: Secondary | ICD-10-CM | POA: Diagnosis not present

## 2016-08-28 DIAGNOSIS — Z01810 Encounter for preprocedural cardiovascular examination: Secondary | ICD-10-CM | POA: Insufficient documentation

## 2016-08-28 DIAGNOSIS — N950 Postmenopausal bleeding: Secondary | ICD-10-CM | POA: Insufficient documentation

## 2016-08-28 LAB — CBC
HCT: 40.1 % (ref 36.0–46.0)
Hemoglobin: 13.3 g/dL (ref 12.0–15.0)
MCH: 28.7 pg (ref 26.0–34.0)
MCHC: 33.2 g/dL (ref 30.0–36.0)
MCV: 86.6 fL (ref 78.0–100.0)
Platelets: 465 10*3/uL — ABNORMAL HIGH (ref 150–400)
RBC: 4.63 MIL/uL (ref 3.87–5.11)
RDW: 14.8 % (ref 11.5–15.5)
WBC: 15.4 10*3/uL — ABNORMAL HIGH (ref 4.0–10.5)

## 2016-08-28 LAB — COMPREHENSIVE METABOLIC PANEL
ALT: 27 U/L (ref 14–54)
AST: 25 U/L (ref 15–41)
Albumin: 3.7 g/dL (ref 3.5–5.0)
Alkaline Phosphatase: 89 U/L (ref 38–126)
Anion gap: 10 (ref 5–15)
BUN: 16 mg/dL (ref 6–20)
CO2: 27 mmol/L (ref 22–32)
Calcium: 9.2 mg/dL (ref 8.9–10.3)
Chloride: 99 mmol/L — ABNORMAL LOW (ref 101–111)
Creatinine, Ser: 0.86 mg/dL (ref 0.44–1.00)
GFR calc Af Amer: >60 mL/min (ref >60)
GFR calc non Af Amer: >60 mL/min (ref >60)
Glucose, Bld: 141 mg/dL — ABNORMAL HIGH (ref 65–99)
Potassium: 4.1 mmol/L (ref 3.5–5.1)
Sodium: 136 mmol/L (ref 135–145)
Total Bilirubin: 0.5 mg/dL (ref 0.3–1.2)
Total Protein: 7.3 g/dL (ref 6.5–8.1)

## 2016-08-28 LAB — TYPE AND SCREEN
ABO/RH(D): O POS
Antibody Screen: NEGATIVE

## 2016-08-28 LAB — ABO/RH: ABO/RH(D): O POS

## 2016-08-28 NOTE — Patient Instructions (Addendum)
Your procedure is scheduled on: Thursday September 05, 2016  Enter through the [employer] [facility] at: 11:30  Pick up the phone at the desk and dial [phone].  Call this number if you have problems the morning of surgery: [phone].  Remember: Do NOT eat food: After midnight  on Wednesday November 29 Do NOT drink clear liquids (including water) after: 9AM ON Thursday November 30  Take these medicines the morning of surgery with a SIP OF WATER: Dyazide (Triamterene)  BRING ALBUTEROL INHALER WITH YOU DAY OF SURGERY  Do NOT wear jewelry (body piercing), metal hair clips/bobby pins, make-up, or nail polish. Do NOT wear lotions, powders, or perfumes.  You may wear deoderant. Do NOT shave for 48 hours prior to surgery. Do NOT bring valuables to the hospital. Contacts, dentures, or bridgework may not be worn into surgery.  Have a responsible adult drive you home and stay with you for 24 hours after your procedure

## 2016-08-30 ENCOUNTER — Other Ambulatory Visit (HOSPITAL_COMMUNITY): Payer: 59

## 2016-08-30 ENCOUNTER — Inpatient Hospital Stay (HOSPITAL_COMMUNITY): Admission: RE | Admit: 2016-08-30 | Payer: 59 | Source: Ambulatory Visit

## 2016-09-05 ENCOUNTER — Ambulatory Visit (HOSPITAL_COMMUNITY)
Admission: RE | Admit: 2016-09-05 | Discharge: 2016-09-05 | Disposition: A | Discharge status: Home / Self Care | Payer: 59 | Source: Ambulatory Visit | Attending: Obstetrics and Gynecology | Admitting: Obstetrics and Gynecology

## 2016-09-05 ENCOUNTER — Ambulatory Visit (HOSPITAL_COMMUNITY): Payer: 59 | Admitting: Anesthesiology

## 2016-09-05 ENCOUNTER — Encounter (HOSPITAL_COMMUNITY)
Admission: RE | Disposition: A | Discharge status: Home / Self Care | Payer: Self-pay | Source: Ambulatory Visit | Attending: Obstetrics and Gynecology

## 2016-09-05 ENCOUNTER — Encounter (HOSPITAL_COMMUNITY): Payer: Self-pay

## 2016-09-05 DIAGNOSIS — Z87891 Personal history of nicotine dependence: Secondary | ICD-10-CM | POA: Diagnosis not present

## 2016-09-05 DIAGNOSIS — J45909 Unspecified asthma, uncomplicated: Secondary | ICD-10-CM | POA: Diagnosis not present

## 2016-09-05 DIAGNOSIS — N939 Abnormal uterine and vaginal bleeding, unspecified: Secondary | ICD-10-CM | POA: Diagnosis present

## 2016-09-05 DIAGNOSIS — I10 Essential (primary) hypertension: Secondary | ICD-10-CM | POA: Diagnosis not present

## 2016-09-05 DIAGNOSIS — Z79899 Other long term (current) drug therapy: Secondary | ICD-10-CM | POA: Insufficient documentation

## 2016-09-05 HISTORY — PX: HYSTEROSCOPY WITH D & C: SHX1775

## 2016-09-05 SURGERY — DILATATION AND CURETTAGE /HYSTEROSCOPY
Anesthesia: General | Site: Vagina

## 2016-09-05 MED ORDER — MIDAZOLAM HCL 2 MG/2ML IJ SOLN
INTRAMUSCULAR | Status: DC | PRN
  Administered 2016-09-05: 1 mg via INTRAVENOUS

## 2016-09-05 MED ORDER — LACTATED RINGERS IV SOLN
INTRAVENOUS | Status: DC
  Administered 2016-09-05: 125 mL/h via INTRAVENOUS

## 2016-09-05 MED ORDER — LIDOCAINE HCL 1 % IJ SOLN
INTRAMUSCULAR | Status: AC
  Filled 2016-09-05: qty 20

## 2016-09-05 MED ORDER — SCOPOLAMINE 1 MG/3DAYS TD PT72
MEDICATED_PATCH | TRANSDERMAL | Status: AC
  Filled 2016-09-05: qty 1

## 2016-09-05 MED ORDER — PROPOFOL 10 MG/ML IV BOLUS
INTRAVENOUS | Status: DC | PRN
  Administered 2016-09-05: 50 mg via INTRAVENOUS
  Administered 2016-09-05: 200 mg via INTRAVENOUS
  Administered 2016-09-05: 100 mg via INTRAVENOUS
  Administered 2016-09-05: 50 mg via INTRAVENOUS

## 2016-09-05 MED ORDER — SUCCINYLCHOLINE CHLORIDE 20 MG/ML IJ SOLN
INTRAMUSCULAR | Status: DC | PRN
  Administered 2016-09-05: 140 mg via INTRAVENOUS

## 2016-09-05 MED ORDER — SODIUM CHLORIDE 0.9 % IR SOLN
Status: DC | PRN
  Administered 2016-09-05: 3000 mL

## 2016-09-05 MED ORDER — PROPOFOL 10 MG/ML IV BOLUS
INTRAVENOUS | Status: AC
  Filled 2016-09-05: qty 20

## 2016-09-05 MED ORDER — FENTANYL CITRATE (PF) 100 MCG/2ML IJ SOLN
INTRAMUSCULAR | Status: AC
  Filled 2016-09-05: qty 2

## 2016-09-05 MED ORDER — FENTANYL CITRATE (PF) 100 MCG/2ML IJ SOLN
INTRAMUSCULAR | Status: DC | PRN
  Administered 2016-09-05 (×2): 50 ug via INTRAVENOUS

## 2016-09-05 MED ORDER — DEXAMETHASONE SODIUM PHOSPHATE 4 MG/ML IJ SOLN
INTRAMUSCULAR | Status: AC
  Filled 2016-09-05: qty 1

## 2016-09-05 MED ORDER — SCOPOLAMINE 1 MG/3DAYS TD PT72
1.0000 | MEDICATED_PATCH | Freq: Once | TRANSDERMAL | Status: DC
Start: 2016-09-05 — End: 2016-09-05
  Administered 2016-09-05: 1.5 mg via TRANSDERMAL

## 2016-09-05 MED ORDER — MIDAZOLAM HCL 2 MG/2ML IJ SOLN
INTRAMUSCULAR | Status: AC
  Filled 2016-09-05: qty 2

## 2016-09-05 MED ORDER — ALBUTEROL SULFATE HFA 108 (90 BASE) MCG/ACT IN AERS
INHALATION_SPRAY | RESPIRATORY_TRACT | Status: DC | PRN
  Administered 2016-09-05 (×2): 2 via RESPIRATORY_TRACT

## 2016-09-05 MED ORDER — ONDANSETRON HCL 4 MG/2ML IJ SOLN
INTRAMUSCULAR | Status: DC | PRN
  Administered 2016-09-05: 4 mg via INTRAVENOUS

## 2016-09-05 MED ORDER — KETOROLAC TROMETHAMINE 30 MG/ML IJ SOLN
INTRAMUSCULAR | Status: AC
  Filled 2016-09-05: qty 1

## 2016-09-05 MED ORDER — OXYCODONE-ACETAMINOPHEN 2.5-325 MG PO TABS: 1.0000 | ORAL_TABLET | ORAL | 0 refills | Status: DC | PRN

## 2016-09-05 MED ORDER — ALBUTEROL SULFATE HFA 108 (90 BASE) MCG/ACT IN AERS
INHALATION_SPRAY | RESPIRATORY_TRACT | Status: AC
  Filled 2016-09-05: qty 6.7

## 2016-09-05 MED ORDER — LIDOCAINE HCL 1 % IJ SOLN
INTRAMUSCULAR | Status: DC | PRN
  Administered 2016-09-05: 3 mL

## 2016-09-05 MED ORDER — LIDOCAINE HCL (CARDIAC) 20 MG/ML IV SOLN
INTRAVENOUS | Status: DC | PRN
  Administered 2016-09-05: 30 mg via INTRAVENOUS

## 2016-09-05 MED ORDER — LIDOCAINE HCL (CARDIAC) 20 MG/ML IV SOLN
INTRAVENOUS | Status: AC
  Filled 2016-09-05: qty 5

## 2016-09-05 MED ORDER — ONDANSETRON HCL 4 MG/2ML IJ SOLN
INTRAMUSCULAR | Status: AC
  Filled 2016-09-05: qty 2

## 2016-09-05 MED ORDER — DEXAMETHASONE SODIUM PHOSPHATE 10 MG/ML IJ SOLN
INTRAMUSCULAR | Status: DC | PRN
  Administered 2016-09-05: 4 mg via INTRAVENOUS

## 2016-09-05 SURGICAL SUPPLY — 20 items
BIPOLAR CUTTING LOOP 21FR (ELECTRODE)
CANISTER SUCT 3000ML (MISCELLANEOUS) ×2 IMPLANT
CATH ROBINSON RED A/P 16FR (CATHETERS) ×2 IMPLANT
CLOTH BEACON ORANGE TIMEOUT ST (SAFETY) ×2 IMPLANT
CONTAINER PREFILL 10% NBF 60ML (FORM) ×2 IMPLANT
ELECT COAG BIPOL BALL 21FR (ELECTRODE) IMPLANT
ELECT REM PT RETURN 9FT ADLT (ELECTROSURGICAL)
ELECTRODE REM PT RTRN 9FT ADLT (ELECTROSURGICAL) IMPLANT
GLOVE BIO SURGEON STRL SZ7 (GLOVE) ×2 IMPLANT
GLOVE BIOGEL PI IND STRL 7.0 (GLOVE) ×1 IMPLANT
GLOVE BIOGEL PI INDICATOR 7.0 (GLOVE) ×1
GOWN STRL REUS W/TWL LRG LVL3 (GOWN DISPOSABLE) ×4 IMPLANT
LOOP CUTTING BIPOLAR 21FR (ELECTRODE) IMPLANT
PACK VAGINAL MINOR WOMEN LF (CUSTOM PROCEDURE TRAY) ×2 IMPLANT
PAD OB MATERNITY 4.3X12.25 (PERSONAL CARE ITEMS) ×2 IMPLANT
PIPET BIOPSY ENDOMETRIAL 3MM (SUCTIONS) ×2 IMPLANT
TOWEL OR 17X24 6PK STRL BLUE (TOWEL DISPOSABLE) ×4 IMPLANT
TUBING AQUILEX INFLOW (TUBING) ×2 IMPLANT
TUBING AQUILEX OUTFLOW (TUBING) ×2 IMPLANT
WATER STERILE IRR 1000ML POUR (IV SOLUTION) IMPLANT

## 2016-09-05 NOTE — Op Note (Signed)
Preoperative diagnosis: Abnormal uterine bleeding  Postoperative diagnosis: Same  Procedure: D&C, hysteroscopy  Surgeon: Matthew Saras  Anesthesia: Gen.  Procedure and findings:  Patient taken the operating room after an adequate level of general anesthesia was obtained the patient's legs in stirrups the perineum and vagina were prepped and draped and the bladder was drained. Appropriate timeouts were taken at that point. She had a very anteflexed cervix, with her relaxed, a speculum was positioned cervix grasped with tenaculum visualization was still difficult but could sounded to 9 cm progressively dilated to 26 Pratt, the small continuous flow hysteroscope was inserted revealing some minimal tissue buildup in the endometrium no definite polyps noted. Sharp curettage was carried out followed by Pipelle endometrial biopsy. The scope was reinserted cavity irrigated noted to be clean there was minimal tissue removed she tolerated this well went to recovery room in good condition.  Dictated with RobelineD.

## 2016-09-05 NOTE — Anesthesia Procedure Notes (Signed)
Procedure Name: Intubation Date/Time: 09/05/2016 1:07 PM Performed by: Tobin Chad Pre-anesthesia Checklist: Patient identified, Emergency Drugs available, Suction available, Patient being monitored and Timeout performed Patient Re-evaluated:Patient Re-evaluated prior to inductionOxygen Delivery Method: Circle system utilized and Simple face mask Preoxygenation: Pre-oxygenation with 100% oxygen Intubation Type: IV induction and Inhalational induction Ventilation: Mask ventilation with difficulty LMA: LMA inserted LMA Size: 4.0 Laryngoscope Size: Mac and Miller Grade View: Grade III Tube type: Oral Tube size: 7.0 mm Airway Equipment and Method: Stylet Placement Confirmation: ETT inserted through vocal cords under direct vision,  positive ETCO2 and breath sounds checked- equal and bilateral Secured at: 22 cm Tube secured with: Tape Dental Injury: Teeth and Oropharynx as per pre-operative assessment

## 2016-09-05 NOTE — OR Nursing (Signed)
Type and screen drawn 08/28/2016 is not effect on 09/05/2016. Dr. Matthew Saras notified, stated did not need a Type and Screen today for surgery.

## 2016-09-05 NOTE — Transfer of Care (Signed)
Immediate Anesthesia Transfer of Care Note  Patient: Sarah Dodson  Procedure(s) Performed: Procedure(s): DILATATION AND CURETTAGE /HYSTEROSCOPY (N/A)  Patient Location: PACU  Anesthesia Type:General  Level of Consciousness: awake, alert , oriented and patient cooperative  Airway & Oxygen Therapy: To Pacu on non-rebreather  Post-op Assessment: Report given to RN and Post -op Vital signs reviewed and stable  Post vital signs: Reviewed and stable  Last Vitals:  Vitals:   09/05/16 1151  BP: 137/63  Pulse: 87  Resp: 20  Temp: 36.8 C    Last Pain:  Vitals:   09/05/16 1151  TempSrc: Oral      Patients Stated Pain Goal: 4 ([id number])  Complications: No apparent anesthesia complications

## 2016-09-05 NOTE — Progress Notes (Signed)
The patient was re-examined with no change in statusThe patient was re-examined with no change in status

## 2016-09-05 NOTE — Anesthesia Preprocedure Evaluation (Addendum)
Anesthesia Evaluation  Patient identified by MRN, date of birth, ID band Patient awake    Reviewed: Allergy & Precautions, H&P , NPO status , Patient's Chart, lab work & pertinent test results  History of Anesthesia Complications (+) PONV and history of anesthetic complications  Airway Mallampati: III  TM Distance: >3 FB Neck ROM: full    Dental  (+) Teeth Intact, Dental Advidsory Given   Pulmonary asthma , former smoker,    Pulmonary exam normal        Cardiovascular hypertension,  Rhythm:Regular Rate:Normal     Neuro/Psych negative neurological ROS  negative psych ROS   GI/Hepatic negative GI ROS, Neg liver ROS,   Endo/Other  Morbid obesity  Renal/GU      Musculoskeletal   Abdominal (+) + obese,   Peds  Hematology   Anesthesia Other Findings   Reproductive/Obstetrics                           Anesthesia Physical  Anesthesia Plan  ASA: III  Anesthesia Plan: General   Post-op Pain Management:  Regional for Post-op pain   Induction: Intravenous  Airway Management Planned: Oral ETT and LMA  Additional Equipment:   Intra-op Plan:   Post-operative Plan: Extubation in OR  Informed Consent: I have reviewed the patients History and Physical, chart, labs and discussed the procedure including the risks, benefits and alternatives for the proposed anesthesia with the patient or authorized representative who has indicated his/her understanding and acceptance.   Dental Advisory Given  Plan Discussed with: Anesthesiologist and CRNA  Anesthesia Plan Comments:        Anesthesia Quick Evaluation

## 2016-09-05 NOTE — Discharge Instructions (Signed)

## 2016-09-06 ENCOUNTER — Encounter (HOSPITAL_COMMUNITY): Payer: Self-pay | Admitting: Obstetrics and Gynecology

## 2016-09-06 NOTE — Anesthesia Postprocedure Evaluation (Signed)
Anesthesia Post Note  Patient: Sarah Dodson  Procedure(s) Performed: Procedure(s) (LRB): DILATATION AND CURETTAGE /HYSTEROSCOPY (N/A)  Patient location during evaluation: PACU Anesthesia Type: General Level of consciousness: awake Pain management: pain level controlled Vital Signs Assessment: post-procedure vital signs reviewed and stable Respiratory status: spontaneous breathing Cardiovascular status: stable Postop Assessment: no signs of nausea or vomiting Anesthetic complications: no    Last Vitals:  Vitals:   09/05/16 1438 09/05/16 1540  BP:  134/72  Pulse: 88 86  Resp: 16 18  Temp: 36.9 C 36.8 C    Last Pain:  Vitals:   09/06/16 1013  TempSrc:   PainSc: 0-No pain                 Almas Rake

## 2016-09-26 ENCOUNTER — Ambulatory Visit (INDEPENDENT_AMBULATORY_CARE_PROVIDER_SITE_OTHER): Payer: 59 | Admitting: Orthopedic Surgery

## 2016-09-26 VITALS — Ht 61.0 in | Wt 341.0 lb

## 2016-09-26 DIAGNOSIS — M1711 Unilateral primary osteoarthritis, right knee: Secondary | ICD-10-CM | POA: Insufficient documentation

## 2016-09-26 MED ORDER — METHYLPREDNISOLONE ACETATE 40 MG/ML IJ SUSP
40.0000 mg | INTRAMUSCULAR | Status: AC | PRN
  Administered 2016-09-26: 40 mg via INTRA_ARTICULAR

## 2016-09-26 MED ORDER — LIDOCAINE HCL 1 % IJ SOLN
5.0000 mL | INTRAMUSCULAR | Status: AC | PRN
  Administered 2016-09-26: 5 mL

## 2016-09-26 NOTE — Progress Notes (Signed)
Office Visit Note   Patient: Sarah Dodson           Date of Birth: 03/15/1961           MRN: [id number] Visit Date: 09/26/2016              Requested by: Lavone Orn, MD [address] [place], [place] [address] PCP: Irven Shelling, MD   Assessment & Plan: Visit Diagnoses:  1. Unilateral primary osteoarthritis, right knee     Plan: Right knee injected without complications. Discussed diet and nutrition for weight loss for potential total knee arthroplasty.  Follow-Up Instructions: Return if symptoms worsen or fail to improve.   Orders:  No orders of the defined types were placed in this encounter.  No orders of the defined types were placed in this encounter.     Procedures: Large Joint Inj Date/Time: 09/26/2016 5:03 PM Performed by: Kaleeya Hancock V Authorized by: Newt Minion   Consent Given by:  Patient Site marked: the procedure site was marked   Timeout: prior to procedure the correct patient, procedure, and site was verified   Indications:  Pain and diagnostic evaluation Location:  Knee Site:  R knee Prep: patient was prepped and draped in usual sterile fashion   Needle Size:  22 G Needle Length:  1.5 inches Ultrasound Guidance: No   Fluoroscopic Guidance: No   Arthrogram: No   Medications:  5 mL lidocaine 1 %; 40 mg methylPREDNISolone acetate 40 MG/ML Aspiration Attempted: No   Patient tolerance:  Patient tolerated the procedure well with no immediate complications     Clinical Data: No additional findings.   Subjective: Chief Complaint  Patient presents with  . Right Knee - Pain    S/p injection 07/22/16    Pt is here with right knee pain. S/p injection 07/22/16 and would like another injection today.     Review of Systems   Objective: Vital Signs: Ht 5\' 1"  (1.549 m)   Wt (!) 341 lb (154.7 kg)   BMI 64.43 kg/m   Physical Exam Examination patient is alert oriented no adenopathy well-dressed normal affect normal  respiratory effort she does have an antalgic gait. Examination she is tender to palpation medial lateral joint line collateral cruciate are stable there is no redness no cellulitis no signs of infection. Ortho Exam  Specialty Comments:  No specialty comments available.  Imaging: No results found.   PMFS History: Patient Active Problem List   Diagnosis Date Noted  . Unilateral primary osteoarthritis, right knee 09/26/2016   Past Medical History:  Diagnosis Date  . Arthritis   . Asthma   . Bronchitis   . Esophagitis   . Hypertension   . Obesity   . PONV (postoperative nausea and vomiting)    nausea with the LEFT ankle surgery    No family history on file.  Past Surgical History:  Procedure Laterality Date  . ANKLE ARTHROSCOPY Left    left 2014/ right 2016  . ANKLE ARTHROSCOPY Right 01/11/2015   Procedure: RIGHT ANKLE ARTHROSCOPY;  Surgeon: Newt Minion, MD;  Location: [facility];  Service: Orthopedics;  Laterality: Right;  . HYSTEROSCOPY W/D&C N/A 09/05/2016   Procedure: DILATATION AND CURETTAGE /HYSTEROSCOPY;  Surgeon: Molli Posey, MD;  Location: [facility];  Service: Gynecology;  Laterality: N/A;   Social History   Occupational History  . Not on file.   Social History Main Topics  . Smoking status: Former Smoker    Packs/day: 1.00  Years: 35.00    Quit date: 01/08/2009  . Smokeless tobacco: Never Used  . Alcohol use No  . Drug use: No  . Sexual activity: Not on file

## 2016-10-12 ENCOUNTER — Encounter (HOSPITAL_COMMUNITY): Payer: Self-pay | Admitting: *Deleted

## 2016-10-12 ENCOUNTER — Emergency Department (HOSPITAL_COMMUNITY)
Admission: EM | Admit: 2016-10-12 | Discharge: 2016-10-12 | Disposition: A | Discharge status: Home / Self Care | Payer: 59 | Attending: Emergency Medicine | Admitting: Emergency Medicine

## 2016-10-12 ENCOUNTER — Emergency Department (HOSPITAL_COMMUNITY): Payer: 59

## 2016-10-12 DIAGNOSIS — J45909 Unspecified asthma, uncomplicated: Secondary | ICD-10-CM | POA: Diagnosis not present

## 2016-10-12 DIAGNOSIS — Z91040 Latex allergy status: Secondary | ICD-10-CM | POA: Diagnosis not present

## 2016-10-12 DIAGNOSIS — Z79899 Other long term (current) drug therapy: Secondary | ICD-10-CM | POA: Diagnosis not present

## 2016-10-12 DIAGNOSIS — I10 Essential (primary) hypertension: Secondary | ICD-10-CM | POA: Diagnosis not present

## 2016-10-12 DIAGNOSIS — R079 Chest pain, unspecified: Secondary | ICD-10-CM | POA: Diagnosis not present

## 2016-10-12 DIAGNOSIS — R0789 Other chest pain: Secondary | ICD-10-CM | POA: Insufficient documentation

## 2016-10-12 DIAGNOSIS — R072 Precordial pain: Secondary | ICD-10-CM | POA: Diagnosis not present

## 2016-10-12 DIAGNOSIS — Z87891 Personal history of nicotine dependence: Secondary | ICD-10-CM | POA: Insufficient documentation

## 2016-10-12 LAB — CBC
HCT: 41.3 % (ref 36.0–46.0)
Hemoglobin: 13.8 g/dL (ref 12.0–15.0)
MCH: 28.9 pg (ref 26.0–34.0)
MCHC: 33.4 g/dL (ref 30.0–36.0)
MCV: 86.4 fL (ref 78.0–100.0)
Platelets: 376 10*3/uL (ref 150–400)
RBC: 4.78 MIL/uL (ref 3.87–5.11)
RDW: 14.5 % (ref 11.5–15.5)
WBC: 13 10*3/uL — ABNORMAL HIGH (ref 4.0–10.5)

## 2016-10-12 LAB — BASIC METABOLIC PANEL
Anion gap: 14 (ref 5–15)
BUN: 19 mg/dL (ref 6–20)
CO2: 25 mmol/L (ref 22–32)
Calcium: 10.2 mg/dL (ref 8.9–10.3)
Chloride: 99 mmol/L — ABNORMAL LOW (ref 101–111)
Creatinine, Ser: 0.86 mg/dL (ref 0.44–1.00)
GFR calc Af Amer: >60 mL/min (ref >60)
GFR calc non Af Amer: >60 mL/min (ref >60)
Glucose, Bld: 97 mg/dL (ref 65–99)
Potassium: 3.5 mmol/L (ref 3.5–5.1)
Sodium: 138 mmol/L (ref 135–145)

## 2016-10-12 LAB — I-STAT TROPONIN, ED: Troponin i, poc: 0 ng/mL (ref 0.00–0.08)

## 2016-10-12 MED ORDER — GI COCKTAIL ~~LOC~~
30.0000 mL | Freq: Once | ORAL | Status: AC
  Administered 2016-10-12: 30 mL via ORAL
  Filled 2016-10-12: qty 30

## 2016-10-12 MED ORDER — ACETAMINOPHEN 500 MG PO TABS
1000.0000 mg | ORAL_TABLET | Freq: Once | ORAL | Status: AC
  Administered 2016-10-12: 1000 mg via ORAL
  Filled 2016-10-12: qty 2

## 2016-10-12 MED ORDER — FAMOTIDINE 20 MG PO TABS
20.0000 mg | ORAL_TABLET | Freq: Once | ORAL | Status: AC
  Administered 2016-10-12: 20 mg via ORAL
  Filled 2016-10-12: qty 1

## 2016-10-12 NOTE — Discharge Instructions (Signed)
It was our pleasure to provide your ER care today - we hope that you feel better.  Your lab work looks good/normal.  If reflux symptoms, try taking acid blocker medication such as pepcid or zantac as need. You may also try maalox or gas-x as need for symptom relief.  For chest discomfort, follow up with cardiologist in the next 1-2 weeks - see referral - call office to arrange appointment.  Return to ER if worse, new symptoms, trouble breathing, persistent/recurrent chest pain, other concern.

## 2016-10-12 NOTE — ED Notes (Signed)
The pt has had central chest pain since last Thursday no sob nausea or dizziness

## 2016-10-12 NOTE — ED Notes (Signed)
Feeling better.

## 2016-10-12 NOTE — ED Triage Notes (Signed)
Pt states sternal and R sided chest pain since Thurs am.  Has been taking tums with intermittent relief.

## 2016-10-12 NOTE — ED Provider Notes (Signed)
[facility] DEPT Provider Note   CSN: [id number] Arrival date & time: 10/12/16  1604     History   Chief Complaint Chief Complaint  Patient presents with  . Chest Pain    HPI Sarah Dodson is a 56 y.o. female.  Patient c/o mid chest pain for the past 2 days. Symptoms constant, persistent, moderate. States feels a bit like heartburn, hx same. Denies personal hx cad. No other recent cp or any exertional cp. No unusual doe or fatigue. No associated sob, nv or diaphoresis. No pleuritic pain. Denies abd pain. Pain does not radiate. No flank or back pain. No neck pain. 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 Denies leg pain or swelling. Non smoker. +fam hx cad/father.    The history is provided by the patient.  Chest Pain   Pertinent negatives include no abdominal pain, no back pain, no cough, no fever, no headaches, no shortness of breath and no vomiting.    Past Medical History:  Diagnosis Date  . Arthritis   . Asthma   . Bronchitis   . Esophagitis   . Hypertension   . Obesity   . PONV (postoperative nausea and vomiting)    nausea with the LEFT ankle surgery    Patient Active Problem List   Diagnosis Date Noted  . Unilateral primary osteoarthritis, right knee 09/26/2016    Past Surgical History:  Procedure Laterality Date  . ANKLE ARTHROSCOPY Left    left 2014/ right 2016  . ANKLE ARTHROSCOPY Right 01/11/2015   Procedure: RIGHT ANKLE ARTHROSCOPY;  Surgeon: Newt Minion, MD;  Location: [facility];  Service: Orthopedics;  Laterality: Right;  . HYSTEROSCOPY W/D&C N/A 09/05/2016   Procedure: DILATATION AND CURETTAGE /HYSTEROSCOPY;  Surgeon: Molli Posey, MD;  Location: [facility];  Service: Gynecology;  Laterality: N/A;    OB History    No data available       Home Medications    Prior to Admission medications   Medication Sig Start Date End Date Taking? Authorizing Provider  albuterol (PROVENTIL HFA;VENTOLIN HFA) 108 (90 BASE) MCG/ACT inhaler Inhale 2  puffs into the lungs every 6 (six) hours as needed for wheezing or shortness of breath.    Historical Provider, MD  amoxicillin-clavulanate (AUGMENTIN) 875-125 MG tablet Take 1 tablet by mouth 2 (two) times daily.    Historical Provider, MD  diclofenac (VOLTAREN) 75 MG EC tablet TAKE 1 TABLET TWICE A DAY WITH FOOD 07/31/16   Newt Minion, MD  fluticasone [facility]) 50 MCG/ACT nasal spray Place 1 spray into both nostrils daily as needed for allergies or rhinitis.    Historical Provider, MD  Fluticasone-Salmeterol (ADVAIR) 250-50 MCG/DOSE AEPB Inhale 1 puff into the lungs 2 (two) times daily.    Historical Provider, MD  hydrocortisone (ANUSOL-HC) 25 MG suppository Place 25 mg rectally 2 (two) times daily as needed for hemorrhoids.  08/09/16   Historical Provider, MD  loratadine (ALAVERT) 10 MG tablet Take 10 mg by mouth daily.    Historical Provider, MD  Multiple Vitamins-Minerals (MULTIVITAMIN WITH MINERALS) tablet Take 1 tablet by mouth daily.    Historical Provider, MD  oxycodone-acetaminophen (PERCOCET) 2.5-325 MG tablet Take 1 tablet by mouth every 4 (four) hours as needed for pain. 09/05/16   Molli Posey, MD  predniSONE (STERAPRED UNI-PAK 21 TAB) 10 MG (21) TBPK tablet Take 10 mg by mouth daily.    Historical Provider, MD  triamterene-hydrochlorothiazide (DYAZIDE) 37.5-25 MG per capsule Take 1 capsule by mouth daily.    Historical  Provider, MD    Family History No family history on file.  Social History Social History  Substance Use Topics  . Smoking status: Former Smoker    Packs/day: 1.00    Years: 35.00    Quit date: 01/08/2009  . Smokeless tobacco: Never Used  . Alcohol use No     Allergies   Latex   Review of Systems Review of Systems  Constitutional: Negative for fever.  HENT: Negative for sore throat.   Eyes: Negative for redness.  Respiratory: Negative for cough and shortness of breath.   Cardiovascular: Positive for chest pain. Negative for leg swelling.    Gastrointestinal: Negative for abdominal pain and vomiting.  Genitourinary: Negative for flank pain.  Musculoskeletal: Negative for back pain and neck pain.  Skin: Negative for rash.  Neurological: Negative for headaches.  Hematological: Does not bruise/bleed easily.  Psychiatric/Behavioral: Negative for confusion.     Physical Exam Updated Vital Signs BP 136/84 (BP Location: Right Arm)   Pulse 96   Temp 98.2 F (36.8 C) (Oral)   Resp 22   Ht 5\' 2"  (1.575 m)   Wt (!) 154.7 kg   BMI 62.37 kg/m   Physical Exam  Constitutional: She appears well-developed and well-nourished. No distress.  HENT:  Head: Atraumatic.  Eyes: Conjunctivae are normal. No scleral icterus.  Neck: Neck supple. No tracheal deviation present.  Cardiovascular: Normal rate, regular rhythm, normal heart sounds and intact distal pulses.  Exam reveals no gallop and no friction rub.   No murmur heard. Pulmonary/Chest: Effort normal and breath sounds normal. No respiratory distress. She exhibits no tenderness.  Abdominal: Soft. Normal appearance and bowel sounds are normal. She exhibits no distension. There is no tenderness.  Musculoskeletal: She exhibits no tenderness.  No asymmetric swelling. Pt notes bil, chr bil ankle edema.   Neurological: She is alert.  Skin: Skin is warm and dry. No rash noted. She is not diaphoretic.  Psychiatric: She has a normal mood and affect.  Nursing note and vitals reviewed.    ED Treatments / Results  Labs (all labs ordered are listed, but only abnormal results are displayed) Results for orders placed or performed during the hospital encounter of [id number]  Basic metabolic panel  Result Value Ref Range   Sodium 138 135 - 145 mmol/L   Potassium 3.5 3.5 - 5.1 mmol/L   Chloride 99 (L) 101 - 111 mmol/L   CO2 25 22 - 32 mmol/L   Glucose, Bld 97 65 - 99 mg/dL   BUN 19 6 - 20 mg/dL   Creatinine, Ser 0.86 0.44 - 1.00 mg/dL   Calcium 10.2 8.9 - 10.3 mg/dL   GFR calc non Af  Amer >60 >60 mL/min   GFR calc Af Amer >60 >60 mL/min   Anion gap 14 5 - 15  CBC  Result Value Ref Range   WBC 13.0 (H) 4.0 - 10.5 K/uL   RBC 4.78 3.87 - 5.11 MIL/uL   Hemoglobin 13.8 12.0 - 15.0 g/dL   HCT 41.3 36.0 - 46.0 %   MCV 86.4 78.0 - 100.0 fL   MCH 28.9 26.0 - 34.0 pg   MCHC 33.4 30.0 - 36.0 g/dL   RDW 14.5 11.5 - 15.5 %   Platelets 376 150 - 400 K/uL  I-stat troponin, ED  Result Value Ref Range   Troponin i, poc 0.00 0.00 - 0.08 ng/mL   Comment 3           Dg Chest 2 View  Result Date: 10/12/2016 CLINICAL DATA:  Chest pain. EXAM: CHEST  2 VIEW COMPARISON:  None. FINDINGS: The heart size and mediastinal contours are within normal limits. Both lungs are clear. The visualized skeletal structures are unremarkable. IMPRESSION: No active cardiopulmonary disease. Electronically Signed   By: Dorise Bullion III M.D   On: 10/12/2016 17:03    EKG  EKG Interpretation  Date/Time:  Saturday October 12 2016 16:22:41 EST Ventricular Rate:  96 PR Interval:  144 QRS Duration: 98 QT Interval:  344 QTC Calculation: 434 R Axis:   40 Text Interpretation:  Normal sinus rhythm Normal ECG Confirmed by Ashok Cordia  MD, Lennette Bihari ([id number]) on 10/12/2016 7:59:17 PM       Radiology Dg Chest 2 View  Result Date: 10/12/2016 CLINICAL DATA:  Chest pain. EXAM: CHEST  2 VIEW COMPARISON:  None. FINDINGS: The heart size and mediastinal contours are within normal limits. Both lungs are clear. The visualized skeletal structures are unremarkable. IMPRESSION: No active cardiopulmonary disease. Electronically Signed   By: Dorise Bullion III M.D   On: 10/12/2016 17:03    Procedures Procedures (including critical care time)  Medications Ordered in ED Medications  famotidine (PEPCID) tablet 20 mg (not administered)  gi cocktail (Maalox,Lidocaine,Donnatal) (not administered)  acetaminophen (TYLENOL) tablet 1,000 mg (not administered)     Initial Impression / Assessment and Plan / ED Course  I have reviewed  the triage vital signs and the nursing notes.  Pertinent labs & imaging results that were available during my care of the patient were reviewed by me and considered in my medical decision making (see chart for details).  Clinical Course   labs.   Reviewed nursing notes and prior charts for additional history.   Cxr. Ecg.    After symptoms present, constant for 2 days, at rest, trop 0/normal.  Symptoms do not appear c/w acs.  ecg not acute. cxr neg.  Tylenol, pepcid, and gi cocktail given for symptom relief.  Recheck pt asymptomatic, no cp or sob.  Patient currently appears stable for d/c.    Final Clinical Impressions(s) / ED Diagnoses   Final diagnoses:  None    New Prescriptions New Prescriptions   No medications on file     Lajean Saver, MD 10/12/16 2036

## 2016-11-21 ENCOUNTER — Ambulatory Visit (INDEPENDENT_AMBULATORY_CARE_PROVIDER_SITE_OTHER): Payer: 59 | Admitting: Orthopedic Surgery

## 2016-11-21 ENCOUNTER — Encounter (INDEPENDENT_AMBULATORY_CARE_PROVIDER_SITE_OTHER): Payer: Self-pay | Admitting: Orthopedic Surgery

## 2016-11-21 DIAGNOSIS — M1711 Unilateral primary osteoarthritis, right knee: Secondary | ICD-10-CM | POA: Diagnosis not present

## 2016-11-21 MED ORDER — LIDOCAINE HCL 1 % IJ SOLN
5.0000 mL | INTRAMUSCULAR | Status: AC | PRN
  Administered 2016-11-21: 5 mL

## 2016-11-21 MED ORDER — METHYLPREDNISOLONE ACETATE 40 MG/ML IJ SUSP
40.0000 mg | INTRAMUSCULAR | Status: AC | PRN
  Administered 2016-11-21: 40 mg via INTRA_ARTICULAR

## 2016-11-21 NOTE — Progress Notes (Signed)
Office Visit Note   Patient: Sarah Dodson           Date of Birth: [birth year]           MRN: [id number] Visit Date: 11/21/2016              Requested by: Lavone Orn, MD [address] [place], [place] [address] PCP: Irven Shelling, MD  Chief Complaint  Patient presents with  . Right Knee - Follow-up    HPI: S/p injection right knee 09/26/16.  She states that it is getting harder to do her job at [employer] on the [place]. She states that she has to have her foot placed " just right" of her knee will pop and cause pain to the medial, lateral and at times posterior knee. She states that the injection that she had in December only last about one month and that she believes that her knee is getting worse. Autumn L Forrest, RMA  Patient states that she is trying to lose some weight.  Assessment & Plan: Visit Diagnoses:  1. Unilateral primary osteoarthritis, right knee     Plan: With patient's end-stage arthritis she will need a total knee arthroplasty. Discussed the risks and benefits of surgery including increased risk of infection neurovascular injury need for additional surgery with potential complications from elevated BMI. Discussed since patient lives alone and she would need to go to skilled nursing for about 2 weeks. Patient states she would like to proceed with surgery and about 5-6 months she will call us when she is ready for right total knee arthroplasty.  Follow-Up Instructions: Return if symptoms worsen or fail to improve.   Ortho Exam Examination patient is alert oriented no adenopathy well-dressed normal affect normal respiratory effort she has an antalgic gait she has varus alignment of both knees she has difficulty getting from a sitting to a standing position. Eburnation of right knee there is crepitation with range of motion she has full extension and flexion to 100. Collateral and cruciate are stable. There is no effusion no  cellulitis.  Imaging: No results found.  Orders:  No orders of the defined types were placed in this encounter.  No orders of the defined types were placed in this encounter.    Procedures: Large Joint Inj Date/Time: 11/21/2016 2:33 PM Performed by: Aydin Cavalieri V Authorized by: Newt Minion   Consent Given by:  Patient Site marked: the procedure site was marked   Timeout: prior to procedure the correct patient, procedure, and site was verified   Indications:  Pain and diagnostic evaluation Location:  Knee Site:  R knee Prep: patient was prepped and draped in usual sterile fashion   Needle Size:  22 G Needle Length:  1.5 inches Approach:  Anteromedial Ultrasound Guidance: No   Fluoroscopic Guidance: No   Arthrogram: No   Medications:  5 mL lidocaine 1 %; 40 mg methylPREDNISolone acetate 40 MG/ML Aspiration Attempted: No   Patient tolerance:  Patient tolerated the procedure well with no immediate complications    Clinical Data: No additional findings.  Subjective: Review of Systems  Objective: Vital Signs: There were no vitals taken for this visit.  Specialty Comments:  No specialty comments available.  PMFS History: Patient Active Problem List   Diagnosis Date Noted  . Unilateral primary osteoarthritis, right knee 09/26/2016   Past Medical History:  Diagnosis Date  . Arthritis   . Asthma   . Bronchitis   . Esophagitis   .  Hypertension   . Obesity   . PONV (postoperative nausea and vomiting)    nausea with the LEFT ankle surgery    No family history on file.  Past Surgical History:  Procedure Laterality Date  . ANKLE ARTHROSCOPY Left    left 2014/ right 2016  . ANKLE ARTHROSCOPY Right 01/11/2015   Procedure: RIGHT ANKLE ARTHROSCOPY;  Surgeon: Newt Minion, MD;  Location: [facility];  Service: Orthopedics;  Laterality: Right;  . HYSTEROSCOPY W/D&C N/A 09/05/2016   Procedure: DILATATION AND CURETTAGE /HYSTEROSCOPY;  Surgeon: Molli Posey, MD;   Location: [facility];  Service: Gynecology;  Laterality: N/A;   Social History   Occupational History  . Not on file.   Social History Main Topics  . Smoking status: Former Smoker    Packs/day: 1.00    Years: 35.00    Quit date: 01/08/2009  . Smokeless tobacco: Never Used  . Alcohol use No  . Drug use: No  . Sexual activity: Not on file

## 2016-12-25 DIAGNOSIS — J4530 Mild persistent asthma, uncomplicated: Secondary | ICD-10-CM | POA: Diagnosis not present

## 2016-12-25 DIAGNOSIS — I10 Essential (primary) hypertension: Secondary | ICD-10-CM | POA: Diagnosis not present

## 2017-01-21 DIAGNOSIS — I878 Other specified disorders of veins: Secondary | ICD-10-CM | POA: Diagnosis not present

## 2017-01-21 DIAGNOSIS — I10 Essential (primary) hypertension: Secondary | ICD-10-CM | POA: Diagnosis not present

## 2017-01-28 ENCOUNTER — Other Ambulatory Visit (INDEPENDENT_AMBULATORY_CARE_PROVIDER_SITE_OTHER): Payer: Self-pay | Admitting: Orthopedic Surgery

## 2017-02-04 DIAGNOSIS — I10 Essential (primary) hypertension: Secondary | ICD-10-CM | POA: Diagnosis not present

## 2017-02-04 DIAGNOSIS — L209 Atopic dermatitis, unspecified: Secondary | ICD-10-CM | POA: Diagnosis not present

## 2017-02-14 ENCOUNTER — Telehealth (INDEPENDENT_AMBULATORY_CARE_PROVIDER_SITE_OTHER): Payer: Self-pay | Admitting: Orthopedic Surgery

## 2017-02-14 NOTE — Telephone Encounter (Signed)
Ms. Sarah Dodson called to let Dr. Sharol Given know she is ready to schedule TKA.  She would like have this done within the next month.  Will you please fill out a surgery sheet for her and I will call her back to schedule.

## 2017-02-17 NOTE — Telephone Encounter (Signed)
Blue sheet completed for right total knee arthroplasty

## 2017-02-24 NOTE — Telephone Encounter (Signed)
I called Sarah Dodson and left a message for her to return my call to discuss scheduling.

## 2017-03-17 DIAGNOSIS — J0100 Acute maxillary sinusitis, unspecified: Secondary | ICD-10-CM | POA: Diagnosis not present

## 2017-03-17 DIAGNOSIS — R05 Cough: Secondary | ICD-10-CM | POA: Diagnosis not present

## 2017-03-17 DIAGNOSIS — J988 Other specified respiratory disorders: Secondary | ICD-10-CM | POA: Diagnosis not present

## 2017-03-31 ENCOUNTER — Other Ambulatory Visit (INDEPENDENT_AMBULATORY_CARE_PROVIDER_SITE_OTHER): Payer: Self-pay | Admitting: Family

## 2017-04-04 ENCOUNTER — Encounter (HOSPITAL_COMMUNITY): Payer: Self-pay

## 2017-04-04 ENCOUNTER — Encounter (HOSPITAL_COMMUNITY)
Admission: RE | Admit: 2017-04-04 | Discharge: 2017-04-04 | Disposition: A | Discharge status: Home / Self Care | Payer: 59 | Source: Ambulatory Visit | Attending: Orthopedic Surgery | Admitting: Orthopedic Surgery

## 2017-04-04 DIAGNOSIS — Z01812 Encounter for preprocedural laboratory examination: Secondary | ICD-10-CM | POA: Diagnosis not present

## 2017-04-04 DIAGNOSIS — M1711 Unilateral primary osteoarthritis, right knee: Secondary | ICD-10-CM | POA: Diagnosis not present

## 2017-04-04 HISTORY — DX: Gastro-esophageal reflux disease without esophagitis: K21.9

## 2017-04-04 LAB — CBC
HCT: 40.5 % (ref 36.0–46.0)
Hemoglobin: 13.2 g/dL (ref 12.0–15.0)
MCH: 28.6 pg (ref 26.0–34.0)
MCHC: 32.6 g/dL (ref 30.0–36.0)
MCV: 87.9 fL (ref 78.0–100.0)
Platelets: 334 10*3/uL (ref 150–400)
RBC: 4.61 MIL/uL (ref 3.87–5.11)
RDW: 14.9 % (ref 11.5–15.5)
WBC: 8.4 10*3/uL (ref 4.0–10.5)

## 2017-04-04 LAB — BASIC METABOLIC PANEL
Anion gap: 9 (ref 5–15)
BUN: 19 mg/dL (ref 6–20)
CO2: 28 mmol/L (ref 22–32)
Calcium: 10 mg/dL (ref 8.9–10.3)
Chloride: 99 mmol/L — ABNORMAL LOW (ref 101–111)
Creatinine, Ser: 0.96 mg/dL (ref 0.44–1.00)
GFR calc Af Amer: >60 mL/min (ref >60)
GFR calc non Af Amer: >60 mL/min (ref >60)
Glucose, Bld: 112 mg/dL — ABNORMAL HIGH (ref 65–99)
Potassium: 3.9 mmol/L (ref 3.5–5.1)
Sodium: 136 mmol/L (ref 135–145)

## 2017-04-04 LAB — SURGICAL PCR SCREEN
MRSA, PCR: NEGATIVE
Staphylococcus aureus: NEGATIVE

## 2017-04-04 NOTE — Progress Notes (Signed)
PCP - Lavone Orn Pt denies cardiologist or cardiac workup  EKG - 10/13/16 CXR- 10/12/16   Patient denies shortness of breath, fever, cough and chest pain at PAT appointment   Patient verbalized understanding of instructions that were given to them at the PAT appointment. Patient was also instructed that they will need to review over the PAT instructions again at home before surgery.

## 2017-04-04 NOTE — Pre-Procedure Instructions (Signed)
Sarah Dodson  04/04/2017      [id number] [place], [place] - [address] [place] [address] Phone: [phone] Fax: [phone]    Your procedure is scheduled on Wednesday July 11.  Report to [facility] Admitting at 6:30 A.M.  Call this number if you have problems the morning of surgery:  [phone]   Remember:  Do not eat food or drink liquids after midnight.  Take these medicines the morning of surgery with A SIP OF WATER: loratadine (Alavert), flonase if needed, fluticasone-salmeterol (Advair), albuterol if needed, please bring inhaler to hospital with you  7 days prior to surgery STOP taking any diclofenac (voltaren), Aspirin, Aleve, Naproxen, Ibuprofen, Motrin, Advil, Goody's, BC's, all herbal medications, fish oil, and all vitamins    Do not wear jewelry, make-up or nail polish.  Do not wear lotions, powders, or perfumes, or deoderant.  Do not shave 48 hours prior to surgery.  Men may shave face and neck.  Do not bring valuables to the hospital.  [facility] is not responsible for any belongings or valuables.  Contacts, dentures or bridgework may not be worn into surgery.  Leave your suitcase in the car.  After surgery it may be brought to your room.  For patients admitted to the hospital, discharge time will be determined by your treatment team.  Patients discharged the day of surgery will not be allowed to drive home.    Special instructions:    - Preparing For Surgery  Before surgery, you can play an important role. Because skin is not sterile, your skin needs to be as free of germs as possible. You can reduce the number of germs on your skin by washing with CHG (chlorahexidine gluconate) Soap before surgery.  CHG is an antiseptic cleaner which kills germs and bonds with the skin to continue killing germs even after washing.  Please do not use if you have an allergy to  CHG or antibacterial soaps. If your skin becomes reddened/irritated stop using the CHG.  Do not shave (including legs and underarms) for at least 48 hours prior to first CHG shower. It is OK to shave your face.  Please follow these instructions carefully.   1. Shower the NIGHT BEFORE SURGERY and the MORNING OF SURGERY with CHG.   2. If you chose to wash your hair, wash your hair first as usual with your normal shampoo.  3. After you shampoo, rinse your hair and body thoroughly to remove the shampoo.  4. Use CHG as you would any other liquid soap. You can apply CHG directly to the skin and wash gently with a scrungie or a clean washcloth.   5. Apply the CHG Soap to your body ONLY FROM THE NECK DOWN.  Do not use on open wounds or open sores. Avoid contact with your eyes, ears, mouth and genitals (private parts). Wash genitals (private parts) with your normal soap.  6. Wash thoroughly, paying special attention to the area where your surgery will be performed.  7. Thoroughly rinse your body with warm water from the neck down.  8. DO NOT shower/wash with your normal soap after using and rinsing off the CHG Soap.  9. Pat yourself dry with a CLEAN TOWEL.   10. Wear CLEAN PAJAMAS   11. Place CLEAN SHEETS on your bed the night of your first shower and DO NOT SLEEP WITH PETS.    Day  of Surgery: Do not apply any deodorants/lotions. Please wear clean clothes to the hospital/surgery center.      Please read over the following fact sheets that you were given. Total Joint Packet and MRSA Information

## 2017-04-15 MED ORDER — CEFAZOLIN SODIUM 10 G IJ SOLR
3.0000 g | INTRAMUSCULAR | Status: AC
  Administered 2017-04-16: 3 g via INTRAVENOUS
  Filled 2017-04-15: qty 3000

## 2017-04-16 ENCOUNTER — Inpatient Hospital Stay (HOSPITAL_COMMUNITY)
Admission: RE | Admit: 2017-04-16 | Discharge: 2017-04-18 | DRG: 470 | Disposition: A | Discharge status: Skilled Nursing Facility | Payer: 59 | Source: Ambulatory Visit | Attending: Orthopedic Surgery | Admitting: Orthopedic Surgery

## 2017-04-16 ENCOUNTER — Encounter (HOSPITAL_COMMUNITY): Payer: Self-pay | Admitting: General Practice

## 2017-04-16 ENCOUNTER — Inpatient Hospital Stay (HOSPITAL_COMMUNITY): Payer: 59 | Admitting: Certified Registered"

## 2017-04-16 ENCOUNTER — Encounter (HOSPITAL_COMMUNITY)
Admission: RE | Disposition: A | Discharge status: Skilled Nursing Facility | Payer: Self-pay | Source: Ambulatory Visit | Attending: Orthopedic Surgery

## 2017-04-16 DIAGNOSIS — E669 Obesity, unspecified: Secondary | ICD-10-CM | POA: Diagnosis present

## 2017-04-16 DIAGNOSIS — G8918 Other acute postprocedural pain: Secondary | ICD-10-CM | POA: Diagnosis not present

## 2017-04-16 DIAGNOSIS — Z79899 Other long term (current) drug therapy: Secondary | ICD-10-CM

## 2017-04-16 DIAGNOSIS — K5900 Constipation, unspecified: Secondary | ICD-10-CM | POA: Diagnosis not present

## 2017-04-16 DIAGNOSIS — M1711 Unilateral primary osteoarthritis, right knee: Principal | ICD-10-CM

## 2017-04-16 DIAGNOSIS — J45909 Unspecified asthma, uncomplicated: Secondary | ICD-10-CM | POA: Diagnosis not present

## 2017-04-16 DIAGNOSIS — Z87891 Personal history of nicotine dependence: Secondary | ICD-10-CM | POA: Diagnosis not present

## 2017-04-16 DIAGNOSIS — Z96651 Presence of right artificial knee joint: Secondary | ICD-10-CM

## 2017-04-16 DIAGNOSIS — Z471 Aftercare following joint replacement surgery: Secondary | ICD-10-CM | POA: Diagnosis not present

## 2017-04-16 DIAGNOSIS — K219 Gastro-esophageal reflux disease without esophagitis: Secondary | ICD-10-CM | POA: Diagnosis present

## 2017-04-16 DIAGNOSIS — I10 Essential (primary) hypertension: Secondary | ICD-10-CM | POA: Diagnosis present

## 2017-04-16 DIAGNOSIS — Z6841 Body Mass Index (BMI) 40.0 and over, adult: Secondary | ICD-10-CM

## 2017-04-16 DIAGNOSIS — M25561 Pain in right knee: Secondary | ICD-10-CM | POA: Diagnosis not present

## 2017-04-16 HISTORY — PX: TOTAL KNEE ARTHROPLASTY: SHX125

## 2017-04-16 SURGERY — ARTHROPLASTY, KNEE, TOTAL
Anesthesia: General | Site: Knee | Laterality: Right

## 2017-04-16 MED ORDER — OXYCODONE HCL 5 MG PO TABS
5.0000 mg | ORAL_TABLET | ORAL | Status: DC | PRN
  Administered 2017-04-16: 5 mg via ORAL
  Administered 2017-04-16 – 2017-04-18 (×11): 10 mg via ORAL
  Filled 2017-04-16 (×11): qty 2

## 2017-04-16 MED ORDER — PHENOL 1.4 % MT LIQD: 1.0000 | OROMUCOSAL | Status: DC | PRN

## 2017-04-16 MED ORDER — ALBUTEROL SULFATE HFA 108 (90 BASE) MCG/ACT IN AERS
INHALATION_SPRAY | RESPIRATORY_TRACT | Status: AC
  Filled 2017-04-16: qty 6.7

## 2017-04-16 MED ORDER — PROPOFOL 1000 MG/100ML IV EMUL
INTRAVENOUS | Status: AC
  Filled 2017-04-16: qty 100

## 2017-04-16 MED ORDER — ONDANSETRON HCL 4 MG/2ML IJ SOLN
INTRAMUSCULAR | Status: AC
  Filled 2017-04-16: qty 2

## 2017-04-16 MED ORDER — OXYCODONE HCL 5 MG PO TABS: 5.0000 mg | ORAL_TABLET | Freq: Once | ORAL | Status: DC | PRN

## 2017-04-16 MED ORDER — HYDROMORPHONE HCL 1 MG/ML IJ SOLN: 1.0000 mg | INTRAMUSCULAR | Status: DC | PRN

## 2017-04-16 MED ORDER — ROCURONIUM BROMIDE 10 MG/ML (PF) SYRINGE
PREFILLED_SYRINGE | INTRAVENOUS | Status: DC | PRN
  Administered 2017-04-16: 50 mg via INTRAVENOUS

## 2017-04-16 MED ORDER — ACETAMINOPHEN 650 MG RE SUPP: 650.0000 mg | Freq: Four times a day (QID) | RECTAL | Status: DC | PRN

## 2017-04-16 MED ORDER — CHLORHEXIDINE GLUCONATE 4 % EX LIQD: 60.0000 mL | Freq: Once | CUTANEOUS | Status: DC

## 2017-04-16 MED ORDER — FENTANYL CITRATE (PF) 250 MCG/5ML IJ SOLN
INTRAMUSCULAR | Status: DC | PRN
  Administered 2017-04-16 (×2): 50 ug via INTRAVENOUS

## 2017-04-16 MED ORDER — LACTATED RINGERS IV SOLN
INTRAVENOUS | Status: DC | PRN
  Administered 2017-04-16 (×2): via INTRAVENOUS

## 2017-04-16 MED ORDER — SODIUM CHLORIDE 0.9 % IR SOLN
Status: DC | PRN
  Administered 2017-04-16: 3000 mL

## 2017-04-16 MED ORDER — METHOCARBAMOL 500 MG PO TABS
500.0000 mg | ORAL_TABLET | Freq: Four times a day (QID) | ORAL | Status: DC | PRN
  Administered 2017-04-16 – 2017-04-17 (×2): 500 mg via ORAL
  Filled 2017-04-16: qty 1

## 2017-04-16 MED ORDER — ROCURONIUM BROMIDE 50 MG/5ML IV SOLN
INTRAVENOUS | Status: AC
  Filled 2017-04-16: qty 1

## 2017-04-16 MED ORDER — EPHEDRINE 5 MG/ML INJ
INTRAVENOUS | Status: AC
  Filled 2017-04-16: qty 10

## 2017-04-16 MED ORDER — SUGAMMADEX SODIUM 500 MG/5ML IV SOLN
INTRAVENOUS | Status: AC
  Filled 2017-04-16: qty 5

## 2017-04-16 MED ORDER — ALBUTEROL SULFATE (2.5 MG/3ML) 0.083% IN NEBU: 3.0000 mL | INHALATION_SOLUTION | Freq: Four times a day (QID) | RESPIRATORY_TRACT | Status: DC | PRN

## 2017-04-16 MED ORDER — PROPOFOL 10 MG/ML IV BOLUS
INTRAVENOUS | Status: AC
  Filled 2017-04-16: qty 20

## 2017-04-16 MED ORDER — METOCLOPRAMIDE HCL 5 MG/ML IJ SOLN: 5.0000 mg | Freq: Three times a day (TID) | INTRAMUSCULAR | Status: DC | PRN

## 2017-04-16 MED ORDER — TRANEXAMIC ACID 1000 MG/10ML IV SOLN
2000.0000 mg | INTRAVENOUS | Status: AC
  Administered 2017-04-16: 2000 mg via TOPICAL
  Filled 2017-04-16: qty 20

## 2017-04-16 MED ORDER — LIDOCAINE 2% (20 MG/ML) 5 ML SYRINGE
INTRAMUSCULAR | Status: DC | PRN
  Administered 2017-04-16: 60 mg via INTRAVENOUS

## 2017-04-16 MED ORDER — ONDANSETRON HCL 4 MG/2ML IJ SOLN: 4.0000 mg | Freq: Once | INTRAMUSCULAR | Status: DC | PRN

## 2017-04-16 MED ORDER — TRIAMTERENE-HCTZ 37.5-25 MG PO CAPS
1.0000 | ORAL_CAPSULE | Freq: Every day | ORAL | Status: DC
  Administered 2017-04-17 – 2017-04-18 (×2): 1 via ORAL
  Filled 2017-04-16 (×2): qty 1

## 2017-04-16 MED ORDER — METOCLOPRAMIDE HCL 5 MG PO TABS: 5.0000 mg | ORAL_TABLET | Freq: Three times a day (TID) | ORAL | Status: DC | PRN

## 2017-04-16 MED ORDER — ALBUTEROL SULFATE HFA 108 (90 BASE) MCG/ACT IN AERS
INHALATION_SPRAY | RESPIRATORY_TRACT | Status: DC | PRN
  Administered 2017-04-16 (×2): 2 via RESPIRATORY_TRACT

## 2017-04-16 MED ORDER — LORATADINE 10 MG PO TABS
10.0000 mg | ORAL_TABLET | Freq: Every day | ORAL | Status: DC
  Administered 2017-04-17 – 2017-04-18 (×2): 10 mg via ORAL
  Filled 2017-04-16 (×2): qty 1

## 2017-04-16 MED ORDER — OXYCODONE HCL 5 MG/5ML PO SOLN
5.0000 mg | Freq: Once | ORAL | Status: DC | PRN
Start: 2017-04-16 — End: 2017-04-16

## 2017-04-16 MED ORDER — SUGAMMADEX SODIUM 200 MG/2ML IV SOLN
INTRAVENOUS | Status: DC | PRN
  Administered 2017-04-16: 301.2 mg via INTRAVENOUS

## 2017-04-16 MED ORDER — PROPOFOL 10 MG/ML IV BOLUS
INTRAVENOUS | Status: DC | PRN
Start: 2017-04-16 — End: 2017-04-16
  Administered 2017-04-16: 250 mg via INTRAVENOUS

## 2017-04-16 MED ORDER — CEFAZOLIN SODIUM-DEXTROSE 2-4 GM/100ML-% IV SOLN
2.0000 g | Freq: Four times a day (QID) | INTRAVENOUS | Status: AC
  Administered 2017-04-16 (×2): 2 g via INTRAVENOUS
  Filled 2017-04-16 (×2): qty 100

## 2017-04-16 MED ORDER — ONDANSETRON HCL 4 MG PO TABS: 4.0000 mg | ORAL_TABLET | Freq: Four times a day (QID) | ORAL | Status: DC | PRN

## 2017-04-16 MED ORDER — METHOCARBAMOL 500 MG PO TABS
ORAL_TABLET | ORAL | Status: AC
  Filled 2017-04-16: qty 1

## 2017-04-16 MED ORDER — SCOPOLAMINE 1 MG/3DAYS TD PT72
MEDICATED_PATCH | TRANSDERMAL | Status: AC
  Filled 2017-04-16: qty 1

## 2017-04-16 MED ORDER — ACETAMINOPHEN 325 MG PO TABS
650.0000 mg | ORAL_TABLET | Freq: Four times a day (QID) | ORAL | Status: DC | PRN
  Administered 2017-04-18: 650 mg via ORAL
  Filled 2017-04-16: qty 2

## 2017-04-16 MED ORDER — MIDAZOLAM HCL 5 MG/5ML IJ SOLN
INTRAMUSCULAR | Status: DC | PRN
  Administered 2017-04-16: 2 mg via INTRAVENOUS

## 2017-04-16 MED ORDER — FENTANYL CITRATE (PF) 250 MCG/5ML IJ SOLN
INTRAMUSCULAR | Status: AC
  Filled 2017-04-16: qty 5

## 2017-04-16 MED ORDER — KETOROLAC TROMETHAMINE 15 MG/ML IJ SOLN
INTRAMUSCULAR | Status: AC
  Administered 2017-04-16: 15 mg via INTRAVENOUS
  Filled 2017-04-16: qty 1

## 2017-04-16 MED ORDER — TRANEXAMIC ACID 1000 MG/10ML IV SOLN
1000.0000 mg | INTRAVENOUS | Status: AC
  Administered 2017-04-16: 1000 mg via INTRAVENOUS
  Filled 2017-04-16: qty 10

## 2017-04-16 MED ORDER — SODIUM CHLORIDE 0.9 % IV SOLN
INTRAVENOUS | Status: DC
  Administered 2017-04-16: 14:00:00 via INTRAVENOUS

## 2017-04-16 MED ORDER — HYDROMORPHONE HCL 1 MG/ML IJ SOLN
INTRAMUSCULAR | Status: AC
  Filled 2017-04-16: qty 0.5

## 2017-04-16 MED ORDER — PROPOFOL 500 MG/50ML IV EMUL
INTRAVENOUS | Status: DC | PRN
  Administered 2017-04-16: 25 ug/kg/min via INTRAVENOUS

## 2017-04-16 MED ORDER — FLUTICASONE PROPIONATE 50 MCG/ACT NA SUSP
1.0000 | Freq: Every day | NASAL | Status: DC | PRN
  Filled 2017-04-16: qty 16

## 2017-04-16 MED ORDER — SUCCINYLCHOLINE CHLORIDE 200 MG/10ML IV SOSY
PREFILLED_SYRINGE | INTRAVENOUS | Status: DC | PRN
Start: 2017-04-16 — End: 2017-04-16
  Administered 2017-04-16: 160 mg via INTRAVENOUS

## 2017-04-16 MED ORDER — 0.9 % SODIUM CHLORIDE (POUR BTL) OPTIME
TOPICAL | Status: DC | PRN
  Administered 2017-04-16: 1000 mL

## 2017-04-16 MED ORDER — SCOPOLAMINE 1 MG/3DAYS TD PT72
MEDICATED_PATCH | TRANSDERMAL | Status: DC | PRN
  Administered 2017-04-16: 1 via TRANSDERMAL

## 2017-04-16 MED ORDER — DEXTROSE 5 % IV SOLN
500.0000 mg | Freq: Four times a day (QID) | INTRAVENOUS | Status: DC | PRN
  Filled 2017-04-16: qty 5

## 2017-04-16 MED ORDER — ONDANSETRON HCL 4 MG/2ML IJ SOLN: 4.0000 mg | Freq: Four times a day (QID) | INTRAMUSCULAR | Status: DC | PRN

## 2017-04-16 MED ORDER — SODIUM CHLORIDE 0.9 % IV SOLN
1000.0000 mg | Freq: Once | INTRAVENOUS | Status: AC
  Administered 2017-04-16: 1000 mg via INTRAVENOUS
  Filled 2017-04-16: qty 10

## 2017-04-16 MED ORDER — DEXAMETHASONE SODIUM PHOSPHATE 10 MG/ML IJ SOLN
INTRAMUSCULAR | Status: AC
  Filled 2017-04-16: qty 1

## 2017-04-16 MED ORDER — KETOROLAC TROMETHAMINE 15 MG/ML IJ SOLN
15.0000 mg | Freq: Four times a day (QID) | INTRAMUSCULAR | Status: AC
  Administered 2017-04-16 – 2017-04-17 (×4): 15 mg via INTRAVENOUS
  Filled 2017-04-16 (×3): qty 1

## 2017-04-16 MED ORDER — HYDROMORPHONE HCL 1 MG/ML IJ SOLN
0.2500 mg | INTRAMUSCULAR | Status: DC | PRN
  Administered 2017-04-16 (×2): 0.5 mg via INTRAVENOUS

## 2017-04-16 MED ORDER — ASPIRIN EC 325 MG PO TBEC
325.0000 mg | DELAYED_RELEASE_TABLET | Freq: Every day | ORAL | Status: DC
  Administered 2017-04-17 – 2017-04-18 (×2): 325 mg via ORAL
  Filled 2017-04-16 (×3): qty 1

## 2017-04-16 MED ORDER — DEXAMETHASONE SODIUM PHOSPHATE 10 MG/ML IJ SOLN
INTRAMUSCULAR | Status: DC | PRN
  Administered 2017-04-16: 10 mg via INTRAVENOUS

## 2017-04-16 MED ORDER — MOMETASONE FURO-FORMOTEROL FUM 200-5 MCG/ACT IN AERO
2.0000 | INHALATION_SPRAY | Freq: Two times a day (BID) | RESPIRATORY_TRACT | Status: DC
  Administered 2017-04-16 – 2017-04-18 (×4): 2 via RESPIRATORY_TRACT
  Filled 2017-04-16 (×2): qty 8.8

## 2017-04-16 MED ORDER — OXYCODONE HCL 5 MG PO TABS
ORAL_TABLET | ORAL | Status: AC
  Filled 2017-04-16: qty 2

## 2017-04-16 MED ORDER — MENTHOL 3 MG MT LOZG: 1.0000 | LOZENGE | OROMUCOSAL | Status: DC | PRN

## 2017-04-16 MED ORDER — LIDOCAINE HCL (CARDIAC) 20 MG/ML IV SOLN
INTRAVENOUS | Status: AC
  Filled 2017-04-16: qty 5

## 2017-04-16 MED ORDER — MIDAZOLAM HCL 2 MG/2ML IJ SOLN
INTRAMUSCULAR | Status: AC
  Filled 2017-04-16: qty 2

## 2017-04-16 MED ORDER — ONDANSETRON HCL 4 MG/2ML IJ SOLN
INTRAMUSCULAR | Status: DC | PRN
  Administered 2017-04-16: 4 mg via INTRAVENOUS

## 2017-04-16 MED ORDER — HYDROMORPHONE HCL 1 MG/ML IJ SOLN
INTRAMUSCULAR | Status: AC
  Administered 2017-04-16: 0.5 mg via INTRAVENOUS
  Filled 2017-04-16: qty 0.5

## 2017-04-16 SURGICAL SUPPLY — 50 items
BLADE SAGITTAL 25.0X1.19X90 (BLADE) ×2 IMPLANT
BLADE SAW SGTL 13X75X1.27 (BLADE) ×2 IMPLANT
BLADE SURG 21 STRL SS (BLADE) ×4 IMPLANT
BNDG COHESIVE 6X5 TAN STRL LF (GAUZE/BANDAGES/DRESSINGS) ×2 IMPLANT
BNDG GAUZE ELAST 4 BULKY (GAUZE/BANDAGES/DRESSINGS) ×2 IMPLANT
BONE CEMENT PALACOSE (Cement) ×4 IMPLANT
BOWL SMART MIX CTS (DISPOSABLE) ×2 IMPLANT
CAP KNEE TOTAL 3 SIGMA ×2 IMPLANT
CEMENT BONE PALACOSE (Cement) ×2 IMPLANT
COVER SURGICAL LIGHT HANDLE (MISCELLANEOUS) ×4 IMPLANT
CUFF TOURNIQUET SINGLE 34IN LL (TOURNIQUET CUFF) IMPLANT
CUFF TOURNIQUET SINGLE 44IN (TOURNIQUET CUFF) IMPLANT
DRAPE EXTREMITY T 121X128X90 (DRAPE) ×2 IMPLANT
DRAPE HALF SHEET 40X57 (DRAPES) ×4 IMPLANT
DRAPE U-SHAPE 47X51 STRL (DRAPES) ×2 IMPLANT
DRSG ADAPTIC 3X8 NADH LF (GAUZE/BANDAGES/DRESSINGS) ×2 IMPLANT
DRSG PAD ABDOMINAL 8X10 ST (GAUZE/BANDAGES/DRESSINGS) ×2 IMPLANT
DURAPREP 26ML APPLICATOR (WOUND CARE) ×2 IMPLANT
ELECT REM PT RETURN 9FT ADLT (ELECTROSURGICAL) ×2
ELECTRODE REM PT RTRN 9FT ADLT (ELECTROSURGICAL) ×1 IMPLANT
FACESHIELD WRAPAROUND (MASK) ×2 IMPLANT
GAUZE SPONGE 4X4 12PLY STRL (GAUZE/BANDAGES/DRESSINGS) ×2 IMPLANT
GLOVE BIOGEL PI IND STRL 9 (GLOVE) ×1 IMPLANT
GLOVE BIOGEL PI INDICATOR 9 (GLOVE) ×1
GLOVE SURG ORTHO 9.0 STRL STRW (GLOVE) ×2 IMPLANT
GOWN STRL REUS W/ TWL XL LVL3 (GOWN DISPOSABLE) ×2 IMPLANT
GOWN STRL REUS W/TWL XL LVL3 (GOWN DISPOSABLE) ×2
HANDPIECE INTERPULSE COAX TIP (DISPOSABLE) ×1
KIT BASIN OR (CUSTOM PROCEDURE TRAY) ×2 IMPLANT
KIT ROOM TURNOVER OR (KITS) ×2 IMPLANT
MANIFOLD NEPTUNE II (INSTRUMENTS) ×2 IMPLANT
NEEDLE SPNL 18GX3.5 QUINCKE PK (NEEDLE) ×2 IMPLANT
NS IRRIG 1000ML POUR BTL (IV SOLUTION) ×2 IMPLANT
PACK TOTAL JOINT (CUSTOM PROCEDURE TRAY) ×2 IMPLANT
PACK UNIVERSAL I (CUSTOM PROCEDURE TRAY) ×2 IMPLANT
PAD ARMBOARD 7.5X6 YLW CONV (MISCELLANEOUS) ×2 IMPLANT
SET HNDPC FAN SPRY TIP SCT (DISPOSABLE) ×1 IMPLANT
STAPLER VISISTAT 35W (STAPLE) ×2 IMPLANT
SUCTION FRAZIER HANDLE 10FR (MISCELLANEOUS) ×1
SUCTION TUBE FRAZIER 10FR DISP (MISCELLANEOUS) ×1 IMPLANT
SUT VIC AB 0 CT1 27 (SUTURE) ×2
SUT VIC AB 0 CT1 27XBRD ANBCTR (SUTURE) ×1 IMPLANT
SUT VIC AB 1 CTX 36 (SUTURE) ×2
SUT VIC AB 1 CTX36XBRD ANBCTR (SUTURE) ×1 IMPLANT
SYR 50ML LL SCALE MARK (SYRINGE) ×2 IMPLANT
TOWEL OR 17X24 6PK STRL BLUE (TOWEL DISPOSABLE) ×2 IMPLANT
TOWEL OR 17X26 10 PK STRL BLUE (TOWEL DISPOSABLE) ×2 IMPLANT
TRAY CATH 16FR W/PLASTIC CATH (SET/KITS/TRAYS/PACK) IMPLANT
TRAY FOLEY W/METER SILVER 16FR (SET/KITS/TRAYS/PACK) IMPLANT
WRAP KNEE MAXI GEL POST OP (GAUZE/BANDAGES/DRESSINGS) ×2 IMPLANT

## 2017-04-16 NOTE — Anesthesia Procedure Notes (Signed)
Procedure Name: Intubation Date/Time: 04/16/2017 8:41 AM Performed by: Freddie Breech Pre-anesthesia Checklist: Patient identified, Emergency Drugs available, Suction available and Patient being monitored Patient Re-evaluated:Patient Re-evaluated prior to inductionOxygen Delivery Method: Circle System Utilized Preoxygenation: Pre-oxygenation with 100% oxygen Intubation Type: IV induction Ventilation: Mask ventilation without difficulty Laryngoscope Size: Mac and 4 Grade View: Grade II Tube type: Oral Tube size: 7.5 mm Number of attempts: 1 Airway Equipment and Method: Stylet and Oral airway Placement Confirmation: ETT inserted through vocal cords under direct vision,  positive ETCO2 and breath sounds checked- equal and bilateral Secured at: 21 cm Tube secured with: Tape Dental Injury: Teeth and Oropharynx as per pre-operative assessment

## 2017-04-16 NOTE — Anesthesia Procedure Notes (Addendum)
Anesthesia Regional Block: Adductor canal block   Pre-Anesthetic Checklist: ,, timeout performed, Correct Patient, Correct Site, Correct Laterality, Correct Procedure, Correct Position, site marked, Risks and benefits discussed,  Surgical consent,  Pre-op evaluation,  At surgeon's request and post-op pain management  Laterality: Right  Prep: chloraprep       Needles:   Needle Type: Echogenic Stimulator Needle          Additional Needles:   Procedures: ultrasound guided,,,,,,,,  Narrative:  Start time: 04/16/2017 8:20 AM End time: 04/16/2017 8:25 AM Injection made incrementally with aspirations every 5 mL.  Performed by: Personally  Anesthesiologist: Keyara Ent  Additional Notes: 30 cc 0.5% Bupivacaine with 1:200 epi injected easily

## 2017-04-16 NOTE — Op Note (Signed)
DATE OF SURGERY:  04/16/2017  TIME: 10:17 AM  PATIENT NAME:  Sarah Dodson    AGE: 56 y.o.    PRE-OPERATIVE DIAGNOSIS:  Osteoarthritis Right Knee  POST-OPERATIVE DIAGNOSIS:  Osteoarthritis Right Knee  PROCEDURE:  Procedure(s): RIGHT TOTAL KNEE ARTHROPLASTY  SURGEON: Meridee Score  ASSISTANT: April Green  OPERATIVE IMPLANTS: Depuy , Posterior Stabilized.  Femur size 6, Tibia size 6, Patella size 35 3-peg oval button, with a 5 mm polyethylene insert.   PREOPERATIVE INDICATIONS:   Sarah Dodson is a 56 y.o. year old female with end stage degenerative arthritis of the knee who failed conservative treatment and elected for Total Knee Arthroplasty.   The risks, benefits, and alternatives were discussed at length including but not limited to the risks of infection, bleeding, nerve injury, stiffness, blood clots, the need for revision surgery, cardiopulmonary complications, among others, and they were willing to proceed.  OPERATIVE DESCRIPTION:  The patient was brought to the operative room and placed in a supine position.  General anesthesia was administered.  IV antibiotics were given.  The lower extremity was prepped and draped in the usual sterile fashion.  Sarah Dodson was used to cover all exposed skin. Time out was performed.    Anterior quadriceps tendon splitting approach was performed.  The patella was everted and osteophytes were removed.  The anterior horn of the medial and lateral meniscus was removed.   The distal femur was opened with the drill and the intramedullary distal femoral cutting jig was utilized, set at 5 degrees valgus resecting 9 mm off the distal femur.  Care was taken to protect the collateral ligaments.  Then the extramedullary tibial cutting jig was utilized making the appropriate cut using the anterior tibial crest as a reference building in appropriate posterior slope.  Care was taken during the cut to protect the medial and collateral ligaments.  The  proximal tibia was removed along with the posterior horns of the menisci.  The PCL was sacrificed.    The extensor gap was measured and was approximately 45mm.    The distal femoral sizing jig was applied, taking care to avoid notching.  Then the 4-in-1 cutting jig was applied and the anterior and posterior femur was cut, along with the chamfer cuts.  All posterior osteophytes were removed.  The flexion gap was then measured and was symmetric with the extension gap.  The distal femoral preparation using the appropriate jig to prepare the box.  The patella was then measured, and cut with the saw.    The proximal tibia sized and prepared accordingly with the reamer and the punch, and then all components were trialed with the 66mm poly insert.  The knee was found to have stable balance and full motion.  The knee was irrigated with normal saline and the knee was soaked with TXA.  The above named components were then cemented into place and all excess cement was removed.  The final polyethylene component was in place during cementation.  The knee was  taken through a range of motion and the patella tracked well and the knee irrigated copiously and the parapatellar and subcutaneous tissue closed with vicryl, and skin closed with staples..  A sterile dressing was applied and patient  was taken to the PACU in stable  condition.  There were no complications.  Total tourniquet time was 0 minutes.

## 2017-04-16 NOTE — Discharge Instructions (Signed)

## 2017-04-16 NOTE — H&P (Signed)
TOTAL KNEE ADMISSION H&P  Patient is being admitted for right total knee arthroplasty.  Subjective:  Chief Complaint:right knee pain.  HPI: Sarah Dodson, 56 y.o. female, has a history of pain and functional disability in the right knee due to arthritis and has failed non-surgical conservative treatments for greater than 12 weeks to includeNSAID's and/or analgesics, corticosteriod injections, viscosupplementation injections, use of assistive devices and activity modification.  Onset of symptoms was gradual, starting 8 years ago with gradually worsening course since that time. The patient noted no past surgery on the right knee(s).  Patient currently rates pain in the right knee(s) at 8 out of 10 with activity. Patient has night pain, worsening of pain with activity and weight bearing, pain that interferes with activities of daily living, pain with passive range of motion, crepitus and joint swelling.  Patient has evidence of subchondral cysts, subchondral sclerosis, periarticular osteophytes, joint subluxation and joint space narrowing by imaging studies. This patient has had avascular necrosis of the knee. There is no active infection.  Patient Active Problem List   Diagnosis Date Noted  . Unilateral primary osteoarthritis, right knee 09/26/2016   Past Medical History:  Diagnosis Date  . Arthritis   . Asthma   . Bronchitis   . Esophagitis   . GERD (gastroesophageal reflux disease)   . Hypertension   . Obesity   . PONV (postoperative nausea and vomiting)    nausea with the LEFT ankle surgery    Past Surgical History:  Procedure Laterality Date  . ANKLE ARTHROSCOPY Left    left 2014/ right 2016  . ANKLE ARTHROSCOPY Right 01/11/2015   Procedure: RIGHT ANKLE ARTHROSCOPY;  Surgeon: Newt Minion, MD;  Location: [facility];  Service: Orthopedics;  Laterality: Right;  . BIOPSY BREAST Right   . HYSTEROSCOPY W/D&C N/A 09/05/2016   Procedure: DILATATION AND CURETTAGE /HYSTEROSCOPY;  Surgeon:  Molli Posey, MD;  Location: [facility];  Service: Gynecology;  Laterality: N/A;    Prescriptions Prior to Admission  Medication Sig Dispense Refill Last Dose  . albuterol (PROVENTIL HFA;VENTOLIN HFA) 108 (90 BASE) MCG/ACT inhaler Inhale 2 puffs into the lungs every 6 (six) hours as needed for wheezing or shortness of breath.   Taking  . Calcium Carbonate-Vit D-Min (CALTRATE 600+D PLUS MINERALS) 600-800 MG-UNIT CHEW Chew 2 each by mouth daily.     . diclofenac (VOLTAREN) 75 MG EC tablet Take 75 mg by mouth daily. May take a second dose if needed     . fluticasone (FLONASE) 50 MCG/ACT nasal spray Place 1 spray into both nostrils daily as needed for allergies or rhinitis.   Taking  . Fluticasone-Salmeterol (ADVAIR) 250-50 MCG/DOSE AEPB Inhale 1 puff into the lungs 2 (two) times daily.   Taking  . loratadine (ALAVERT) 10 MG tablet Take 10 mg by mouth daily.   Taking  . Multiple Vitamins-Minerals (MULTIVITAMIN WITH MINERALS) tablet Take 1 tablet by mouth daily.   Taking  . triamterene-hydrochlorothiazide (DYAZIDE) 37.5-25 MG per capsule Take 1 capsule by mouth daily.   Taking   Allergies  Allergen Reactions  . Latex Itching    Social History  Substance Use Topics  . Smoking status: Former Smoker    Packs/day: 1.00    Years: 35.00    Quit date: 01/08/2009  . Smokeless tobacco: Never Used  . Alcohol use No    No family history on file.   ROS  Objective:  Physical Exam  Vital signs in last 24 hours:    Labs:  Estimated body mass index is 60.74 kg/m as calculated from the following:   Height as of 04/04/17: 5\' 2"  (1.575 m).   Weight as of 04/04/17: 332 lb 1.6 oz (150.6 kg).   Imaging Review Plain radiographs demonstrate moderate degenerative joint disease of the right knee(s). The overall alignment ismild varus. The bone quality appears to be adequate for age and reported activity level.  Assessment/Plan:  End stage arthritis, right knee   The patient history, physical  examination, clinical judgment of the provider and imaging studies are consistent with end stage degenerative joint disease of the right knee(s) and total knee arthroplasty is deemed medically necessary. The treatment options including medical management, injection therapy arthroscopy and arthroplasty were discussed at length. The risks and benefits of total knee arthroplasty were presented and reviewed. The risks due to aseptic loosening, infection, stiffness, patella tracking problems, thromboembolic complications and other imponderables were discussed. The patient acknowledged the explanation, agreed to proceed with the plan and consent was signed. Patient is being admitted for inpatient treatment for surgery, pain control, PT, OT, prophylactic antibiotics, VTE prophylaxis, progressive ambulation and ADL's and discharge planning. The patient is planning to be discharged home with home health services

## 2017-04-16 NOTE — Anesthesia Preprocedure Evaluation (Signed)
Anesthesia Evaluation  Patient identified by MRN, date of birth, ID band Patient awake    Reviewed: Allergy & Precautions, NPO status , Patient's Chart, lab work & pertinent test results  Airway Mallampati: III  TM Distance: >3 FB Neck ROM: Full    Dental  (+) Teeth Intact, Dental Advisory Given   Pulmonary former smoker,    breath sounds clear to auscultation       Cardiovascular hypertension,  Rhythm:Regular Rate:Normal     Neuro/Psych    GI/Hepatic   Endo/Other    Renal/GU      Musculoskeletal   Abdominal (+) + obese,   Peds  Hematology   Anesthesia Other Findings   Reproductive/Obstetrics                             Anesthesia Physical Anesthesia Plan  ASA: III  Anesthesia Plan: General   Post-op Pain Management:  Regional for Post-op pain   Induction: Intravenous  PONV Risk Score and Plan: Ondansetron and Dexamethasone  Airway Management Planned: Oral ETT  Additional Equipment:   Intra-op Plan:   Post-operative Plan: Extubation in OR  Informed Consent: I have reviewed the patients History and Physical, chart, labs and discussed the procedure including the risks, benefits and alternatives for the proposed anesthesia with the patient or authorized representative who has indicated his/her understanding and acceptance.   Dental advisory given  Plan Discussed with: CRNA and Anesthesiologist  Anesthesia Plan Comments:         Anesthesia Quick Evaluation

## 2017-04-16 NOTE — Anesthesia Postprocedure Evaluation (Signed)
Anesthesia Post Note  Patient: Sarah Dodson  Procedure(s) Performed: Procedure(s) (LRB): RIGHT TOTAL KNEE ARTHROPLASTY (Right)     Patient location during evaluation: PACU Anesthesia Type: General Level of consciousness: awake, awake and alert and oriented Pain management: pain level controlled Vital Signs Assessment: post-procedure vital signs reviewed and stable Respiratory status: spontaneous breathing, nonlabored ventilation and respiratory function stable Cardiovascular status: blood pressure returned to baseline Anesthetic complications: no    Last Vitals:  Vitals:   04/16/17 1300 04/16/17 1315  BP:    Pulse: 95 100  Resp: 14 18  Temp:  (!) 36.1 C    Last Pain:  Vitals:   04/16/17 1230  PainSc: Asleep                 Bana Borgmeyer COKER

## 2017-04-16 NOTE — Evaluation (Signed)
Physical Therapy Evaluation Patient Details Name: Sarah Dodson MRN: [id number] DOB: 01-03-1961 Today's Date: 04/16/2017   History of Present Illness  Patient is a 56 y/o female admitted for R TKA.  PMH positive for HTN, GERD, obesity, asthma and arthritis.  Clinical Impression  Patient presents with decreased independence with mobility due to deficits listed in PT problem list.  She will benefit from skilled PT in the acute setting and likely will need SNF level rehab upon d/c as she lives alone and currently needing mod A for transfers and assist for toilet hygiene.     Follow Up Recommendations SNF;Supervision/Assistance - 24 hour    Equipment Recommendations  Rolling walker with 5" wheels (wide and short walker)    Recommendations for Other Services       Precautions / Restrictions Precautions Precautions: Fall;Knee      Mobility  Bed Mobility Overal bed mobility: Needs Assistance Bed Mobility: Supine to Sit     Supine to sit: HOB elevated;Min assist     General bed mobility comments: assist for R LE, pt used rail, increased time and cues for technique   Transfers Overall transfer level: Needs assistance Equipment used: Rolling walker (2 wheeled) Transfers: Sit to/from [facility] Sit to Stand: Mod assist Stand pivot transfers: Min assist       General transfer comment: lifting assist from EOB and from [facility]  Ambulation/Gait Ambulation/Gait assistance: Min assist Ambulation Distance (Feet): 14 Feet Assistive device: Rolling walker (2 wheeled) Gait Pattern/deviations: Step-to pattern;Decreased stride length;Trunk flexed;Decreased stance time - right;Antalgic     General Gait Details: walked to window in room and back to chair, cues for technique and for turns  Stairs            Wheelchair Mobility    Modified Rankin (Stroke Patients Only)       Balance Overall balance assessment: Needs assistance   Sitting balance-Leahy  Scale: Fair     Standing balance support: Bilateral upper extremity supported Standing balance-Leahy Scale: Poor Standing balance comment: UE support for balance                             Pertinent Vitals/Pain Pain Assessment: Faces Faces Pain Scale: Hurts even more Pain Location: R knee with movement, comfortable at rest Pain Descriptors / Indicators: Aching Pain Intervention(s): Monitored during session;Repositioned    Home Living Family/patient expects to be discharged to:: Skilled nursing facility Living Arrangements: Alone   Type of Home: House Home Access: Stairs to enter Entrance Stairs-Rails: None Entrance Stairs-Number of Steps: 3 Home Layout: One level Home Equipment: None      Prior Function Level of Independence: Independent         Comments: works for [employer]        Extremity/Trunk Assessment        Lower Extremity Assessment Lower Extremity Assessment: RLE deficits/detail RLE Deficits / Details: AROM ankle WFL, knee flexion seated about 50, strength lifts leg with assist 3-/5       Communication   Communication: No difficulties  Cognition Arousal/Alertness: Awake/alert Behavior During Therapy: WFL for tasks assessed/performed Overall Cognitive Status: Within Functional Limits for tasks assessed                                        General Comments General comments (  skin integrity, edema, etc.): assist for hygiene after toileting    Exercises Total Joint Exercises Ankle Circles/Pumps: AROM;Both;5 reps;Supine   Assessment/Plan    PT Assessment Patient needs continued PT services  PT Problem List Decreased strength;Decreased balance;Decreased range of motion;Decreased mobility;Decreased knowledge of precautions;Pain;Decreased knowledge of use of DME       PT Treatment Interventions DME instruction;Gait training;Balance training;Stair training;Functional mobility training;Therapeutic  exercise;Patient/family education;Therapeutic activities    PT Goals (Current goals can be found in the Care Plan section)  Acute Rehab PT Goals Patient Stated Goal: To return to independent PT Goal Formulation: With patient Time For Goal Achievement: 04/23/17 Potential to Achieve Goals: Good    Frequency 7X/week   Barriers to discharge        Co-evaluation               AM-PAC PT "6 Clicks" Daily Activity  Outcome Measure Difficulty turning over in bed (including adjusting bedclothes, sheets and blankets)?: A Little Difficulty moving from lying on back to sitting on the side of the bed? : Total Difficulty sitting down on and standing up from a chair with arms (e.g., wheelchair, bedside commode, etc,.)?: Total Help needed moving to and from a bed to chair (including a wheelchair)?: A Little Help needed walking in hospital room?: A Little Help needed climbing 3-5 steps with a railing? : A Lot 6 Click Score: 13    End of Session Equipment Utilized During Treatment: Gait belt Activity Tolerance: Patient tolerated treatment well Patient left: in chair Nurse Communication: Mobility status PT Visit Diagnosis: Pain;Difficulty in walking, not elsewhere classified (R26.2) Pain - Right/Left: Right Pain - part of body: Knee    Time: 4037-0964 PT Time Calculation (min) (ACUTE ONLY): 27 min   Charges:   PT Evaluation $PT Eval Moderate Complexity: 1 Procedure PT Treatments $Gait Training: 8-22 mins   PT G CodesMagda Kiel, [place] [phone] 04/16/2017   Reginia Naas 04/16/2017, 4:59 PM

## 2017-04-16 NOTE — Transfer of Care (Signed)
Immediate Anesthesia Transfer of Care Note  Patient: Sarah Dodson  Procedure(s) Performed: Procedure(s): RIGHT TOTAL KNEE ARTHROPLASTY (Right)  Patient Location: PACU  Anesthesia Type:General and GA combined with regional for post-op pain  Level of Consciousness: drowsy and patient cooperative  Airway & Oxygen Therapy: Patient Spontanous Breathing and Patient connected to face mask oxygen  Post-op Assessment: Report given to RN and Post -op Vital signs reviewed and stable  Post vital signs: Reviewed and stable  Last Vitals:  Vitals:   04/16/17 1023 04/16/17 1025  BP: (!) 155/68   Pulse: 92   Resp: 18   Temp: 36.9 C 36.9 C    Last Pain: There were no vitals filed for this visit.       Complications: No apparent anesthesia complications

## 2017-04-17 ENCOUNTER — Encounter (HOSPITAL_COMMUNITY): Payer: Self-pay | Admitting: Orthopedic Surgery

## 2017-04-17 MED ORDER — DOCUSATE SODIUM 100 MG PO CAPS
100.0000 mg | ORAL_CAPSULE | Freq: Two times a day (BID) | ORAL | Status: DC
Start: 2017-04-17 — End: 2017-04-18
  Administered 2017-04-17 – 2017-04-18 (×3): 100 mg via ORAL
  Filled 2017-04-17 (×3): qty 1

## 2017-04-17 MED ORDER — MAGNESIUM CITRATE PO SOLN: 1.0000 | Freq: Every evening | ORAL | Status: DC | PRN

## 2017-04-17 MED ORDER — POLYETHYLENE GLYCOL 3350 17 G PO PACK: 17.0000 g | PACK | Freq: Every day | ORAL | Status: DC | PRN

## 2017-04-17 MED ORDER — SENNOSIDES-DOCUSATE SODIUM 8.6-50 MG PO TABS: 1.0000 | ORAL_TABLET | Freq: Every evening | ORAL | Status: DC | PRN

## 2017-04-17 NOTE — Progress Notes (Signed)
Patient ID: Sarah Dodson, female   DOB: 12-Mar-1961, 56 y.o.   MRN: [id number] Postoperative day 1 total knee arthroplasty. Patient has no complaints this morning. Medications ordered for constipation. We will evaluate on Friday whether discharged to home or discharge to skilled nursing.

## 2017-04-17 NOTE — Progress Notes (Signed)
Physical Therapy Treatment Patient Details Name: Sarah Dodson MRN: [id number] DOB: 08-24-1961 Today's Date: 04/17/2017    History of Present Illness Patient is a 56 y/o female admitted for R TKA.  PMH positive for HTN, GERD, obesity, asthma and arthritis.    PT Comments    Patient progressing with ambulation this session, but still needing assist for R LE management and safety.  Continue to feel at this point she is appropriate for SNF level rehab.  PT to follow.    Follow Up Recommendations  SNF;Supervision/Assistance - 24 hour     Equipment Recommendations  Rolling walker with 5" wheels    Recommendations for Other Services       Precautions / Restrictions Precautions Precautions: Fall;Knee Restrictions RLE Weight Bearing: Weight bearing as tolerated    Mobility  Bed Mobility               General bed mobility comments: up in chair  Transfers Overall transfer level: Needs assistance Equipment used: Rolling walker (2 wheeled) Transfers: Sit to/from Stand   Stand pivot transfers: Min guard       General transfer comment: for safety, cues for technique  Ambulation/Gait Ambulation/Gait assistance: Supervision;Min guard Ambulation Distance (Feet): 120 Feet Assistive device: Rolling walker (2 wheeled) Gait Pattern/deviations: Step-through pattern;Decreased stride length;Step-to pattern;Antalgic     General Gait Details: step to initially, then step through pattern, mild antalgia on R   Stairs            Wheelchair Mobility    Modified Rankin (Stroke Patients Only)       Balance Overall balance assessment: Needs assistance   Sitting balance-Leahy Scale: Good       Standing balance-Leahy Scale: Fair Standing balance comment: can stand static without UE support                            Cognition Arousal/Alertness: Awake/alert Behavior During Therapy: WFL for tasks assessed/performed Overall Cognitive Status: Within  Functional Limits for tasks assessed                                        Exercises Total Joint Exercises Ankle Circles/Pumps: AROM;Both;10 reps;Seated Quad Sets: AROM;Right;10 reps;Seated Short Arc Quad: AROM;AAROM;Right;10 reps;Seated Heel Slides: AAROM;Right;10 reps;Seated Hip ABduction/ADduction: AROM;Right;Seated;10 reps Straight Leg Raises: AROM;AAROM;Right;10 reps;Seated Goniometric ROM: 0-50    General Comments        Pertinent Vitals/Pain Pain Score: 5  Pain Location: R knee with movement, comfortable at rest Pain Descriptors / Indicators: Aching;Sore Pain Intervention(s): Monitored during session;RN gave pain meds during session;Repositioned;Ice applied    Home Living                      Prior Function            PT Goals (current goals can now be found in the care plan section) Progress towards PT goals: Progressing toward goals    Frequency    7X/week      PT Plan      Co-evaluation              AM-PAC PT "6 Clicks" Daily Activity  Outcome Measure  Difficulty turning over in bed (including adjusting bedclothes, sheets and blankets)?: A Little Difficulty moving from lying on back to sitting on the side of the bed? : Total Difficulty sitting  down on and standing up from a chair with arms (e.g., wheelchair, bedside commode, etc,.)?: Total Help needed moving to and from a bed to chair (including a wheelchair)?: A Little Help needed walking in hospital room?: A Little Help needed climbing 3-5 steps with a railing? : A Little 6 Click Score: 14    End of Session Equipment Utilized During Treatment: Gait belt Activity Tolerance: Patient tolerated treatment well Patient left: in chair   PT Visit Diagnosis: Pain;Difficulty in walking, not elsewhere classified (R26.2) Pain - Right/Left: Right Pain - part of body: Knee     Time: 0211-1735 PT Time Calculation (min) (ACUTE ONLY): 38 min  Charges:  $Gait Training:  8-22 mins $Therapeutic Exercise: 8-22 mins                    G CodesMagda Kiel, [place] [phone] 04/17/2017    Reginia Naas 04/17/2017, 1:28 PM

## 2017-04-17 NOTE — Clinical Social Work Note (Signed)
Clinical Social Work Assessment  Patient Details  Name: Sarah Dodson MRN: [id number] Date of Birth: [birth year]  Date of referral:  04/17/17               Reason for consult:  Facility Placement                Permission sought to share information with:  Facility Art therapist granted to share information::  Yes, Verbal Permission Granted  Name::        Agency::  SNF  Relationship::     Contact Information:     Housing/Transportation Living arrangements for the past 2 months:  [place] of Information:  Patient Patient Interpreter Needed:  None Criminal Activity/Legal Involvement Pertinent to Current Situation/Hospitalization:  No - Comment as needed Significant Relationships:  Other Family Members, Other(Comment), Pets (Ex husband) Lives with:  Self, Pets Do you feel safe going back to the place where you live?  No Need for family participation in patient care:  No (Coment)  Care giving concerns:  Patient resides at home alone with 2 dogs. She was independent prior to hospitalization. She is not safe to return home at this time.  She indicated that her niece checks on her occasionally and has an ex-husband that can provide some support. Patient is not sure if she wants to go to SNF as she wants to go home.  Social Worker assessment / plan:  CSW met with patient at bedside to discuss clinical teams recommendation for short term rehab at [place]. CSW explained role and SNF options/placement. CSW obtained verbal permission to send out offers.  CSW discussed with patient the needs for insurance auth for placement. CSW will provide offers once received.   FL2 complete. Passr obtained. Offers sent.  Employment status:  Other (Comment) (Unknown) Insurance information:  Other (Comment Required) PT Recommendations:   [facility]) Information / Referral to community resources:  [place]  Patient/Family's Response to care:  Patient  appreciative of CSW assistance. No issues or concerns identified.  Patient/Family's Understanding of and Emotional Response to Diagnosis, Current Treatment, and Prognosis:  Patient has good understanding of diagnosis, current treatment, and prognosis. Pt hopeful she will improve with short term rehab, however she does want to return home. No issues or concerns identified.   Emotional Assessment Appearance:  Appears stated age Attitude/Demeanor/Rapport:   (Cooperative) Affect (typically observed):  Accepting, Appropriate Orientation:  Oriented to Self, Oriented to Place, Oriented to  Time, Oriented to Situation Alcohol / Substance use:  Not Applicable Psych involvement (Current and /or in the community):  No (Comment)  Discharge Needs  Concerns to be addressed:  Care Coordination Readmission within the last 30 days:  No Current discharge risk:  Dependent with Mobility, Physical Impairment Barriers to Discharge:  No Barriers Identified   Normajean Baxter, LCSW 04/17/2017, 4:24 PM

## 2017-04-17 NOTE — Progress Notes (Signed)
Physical Therapy Treatment Patient Details Name: Sarah Dodson MRN: [id number] DOB: Apr 13, 1961 Today's Date: 04/17/2017    History of Present Illness Patient is a 56 y/o female admitted for R TKA.  PMH positive for HTN, GERD, obesity, asthma and arthritis.    PT Comments    Pt still requiring VC's for transfers and gait for safety. Knee exercise packet filled out and given to pt. Plan next Tx stair training, 3-5 stairs w/ RW.  Follow Up Recommendations  SNF;Supervision/Assistance - 24 hour     Equipment Recommendations  Rolling walker with 5" wheels    Recommendations for Other Services       Precautions / Restrictions Precautions Precautions: Knee Precaution Booklet Issued: Yes (comment) Restrictions Weight Bearing Restrictions: Yes RLE Weight Bearing: Weight bearing as tolerated    Mobility  Bed Mobility               General bed mobility comments: up in chair  Transfers Overall transfer level: Needs assistance Equipment used: Rolling walker (2 wheeled) Transfers: Sit to/from Stand Sit to Stand: Mod assist Stand pivot transfers: Min guard       General transfer comment: cues for hand placement, RLE placement  Ambulation/Gait Ambulation/Gait assistance: Min assist Ambulation Distance (Feet): 125 Feet Assistive device: Rolling walker (2 wheeled) Gait Pattern/deviations: Step-to pattern;Decreased stance time - right;Decreased stride length;Trunk flexed Gait velocity: slow   General Gait Details: VC's for posture, stance time on RLE, looking straight ahead, heel strike/toe off for increased knee extension    Stairs            Wheelchair Mobility    Modified Rankin (Stroke Patients Only)       Balance Overall balance assessment: Needs assistance Sitting-balance support: No upper extremity supported;Feet supported Sitting balance-Leahy Scale: Good     Standing balance support: Bilateral upper extremity supported;During functional  activity Standing balance-Leahy Scale: Fair Standing balance comment: can stand static without UE support                            Cognition Arousal/Alertness: Awake/alert Behavior During Therapy: WFL for tasks assessed/performed Overall Cognitive Status: Within Functional Limits for tasks assessed                                        Exercises Total Joint Exercises Ankle Circles/Pumps: AROM;Both;20 reps;Seated Quad Sets: AROM;Right;10 reps;Seated Towel Squeeze: AROM;10 reps;Both;Seated Short Arc Quad: AAROM;Right;10 reps;Seated Heel Slides: AROM;5 reps;Right;Seated Hip ABduction/ADduction: AROM;10 reps;Right;Seated Straight Leg Raises: AROM;AAROM;Right;10 reps;Seated Goniometric ROM: 65 degrees knee flexion    General Comments        Pertinent Vitals/Pain Pain Score: 0-No pain Pain Location: No pain at rest, R knee pain with movement Pain Descriptors / Indicators: Aching;Sore Pain Intervention(s): Monitored during session    Home Living                      Prior Function            PT Goals (current goals can now be found in the care plan section) Acute Rehab PT Goals Patient Stated Goal: To return to independent PT Goal Formulation: With patient Time For Goal Achievement: 04/23/17 Potential to Achieve Goals: Good Progress towards PT goals: Progressing toward goals    Frequency    7X/week      PT Plan Current plan  remains appropriate    Co-evaluation              AM-PAC PT "6 Clicks" Daily Activity  Outcome Measure  Difficulty turning over in bed (including adjusting bedclothes, sheets and blankets)?: A Little Difficulty moving from lying on back to sitting on the side of the bed? : Total Difficulty sitting down on and standing up from a chair with arms (e.g., wheelchair, bedside commode, etc,.)?: A Lot Help needed moving to and from a bed to chair (including a wheelchair)?: A Little Help needed walking  in hospital room?: A Little Help needed climbing 3-5 steps with a railing? : A Little 6 Click Score: 15    End of Session Equipment Utilized During Treatment: Gait belt Activity Tolerance: Patient tolerated treatment well Patient left: in chair;with call bell/phone within reach   PT Visit Diagnosis: Pain;Difficulty in walking, not elsewhere classified (R26.2) Pain - Right/Left: Right Pain - part of body: Knee     Time: 1430-1505 PT Time Calculation (min) (ACUTE ONLY): 35 min  Charges:  $Gait Training: 8-22 mins $Therapeutic Exercise: 8-22 mins                    G Codes:       Lin Glazier, SPTA [id number]    Ustin Cruickshank 04/17/2017, 3:39 PM

## 2017-04-17 NOTE — NC FL2 (Signed)
[place] LEVEL OF CARE SCREENING TOOL     IDENTIFICATION  Patient Name: Sarah Dodson Birthdate: 1961/02/03 Sex: female Admission Date (Current Location): 04/16/2017  [facility] and [place] Number:  Herbalist and Address:  [facility] [place], [place] [address], [place], [place] [address]      Provider Number: [id number]  Attending Physician Name and Address:  Newt Minion, MD  Relative Name and Phone Number:       Current Level of Care: Hospital Recommended Level of Care: [place] Prior Approval Number:    Date Approved/Denied: 04/17/17 PASRR Number: [id number]  Discharge Plan: SNF    Current Diagnoses: Patient Active Problem List   Diagnosis Date Noted  . Total knee replacement status, right 04/16/2017  . Unilateral primary osteoarthritis, right knee 09/26/2016    Orientation RESPIRATION BLADDER Height & Weight     Self, Time, Situation, Place  O2 (Nasal Cannula 2L) Incontinent Weight: (!) 332 lb 14.3 oz (151 kg) Height:  5\' 2"  (157.5 cm)  BEHAVIORAL SYMPTOMS/MOOD NEUROLOGICAL BOWEL NUTRITION STATUS      Continent Diet (See DC Summary)  AMBULATORY STATUS COMMUNICATION OF NEEDS Skin   Supervision Verbally Surgical wounds (Right Knee Closed Incision, Compression Wrap)                       Personal Care Assistance Level of Assistance  Bathing, Feeding, Dressing Bathing Assistance: Limited assistance Feeding assistance: Limited assistance Dressing Assistance: Limited assistance     Functional Limitations Info             SPECIAL CARE FACTORS FREQUENCY  PT (By licensed PT), OT (By licensed OT)     PT Frequency: 7x week OT Frequency: 5x week            Contractures      Additional Factors Info  Code Status, Allergies Code Status Info: Full Code Allergies Info: Latex           Current Medications (04/17/2017):  This is the current hospital active medication list Current  Facility-Administered Medications  Medication Dose Route Frequency Provider Last Rate Last Dose  . 0.9 %  sodium chloride infusion   Intravenous Continuous Newt Minion, MD 10 mL/hr at 04/16/17 1423    . acetaminophen (TYLENOL) tablet 650 mg  650 mg Oral Q6H PRN Newt Minion, MD       Or  . acetaminophen (TYLENOL) suppository 650 mg  650 mg Rectal Q6H PRN Newt Minion, MD      . albuterol (PROVENTIL) (2.5 MG/3ML) 0.083% nebulizer solution 3 mL  3 mL Inhalation Q6H PRN Newt Minion, MD      . aspirin EC tablet 325 mg  325 mg Oral Q breakfast Newt Minion, MD   325 mg at 04/17/17 0818  . docusate sodium (COLACE) capsule 100 mg  100 mg Oral BID Newt Minion, MD   100 mg at 04/17/17 0813  . fluticasone (FLONASE) 50 MCG/ACT nasal spray 1 spray  1 spray Each Nare Daily PRN Newt Minion, MD      . HYDROmorphone (DILAUDID) injection 1 mg  1 mg Intravenous Q2H PRN Newt Minion, MD      . loratadine (CLARITIN) tablet 10 mg  10 mg Oral Daily Newt Minion, MD   10 mg at 04/17/17 1027  . magnesium citrate solution 1 Bottle  1 Bottle Oral QHS PRN Newt Minion, MD      .  menthol-cetylpyridinium (CEPACOL) lozenge 3 mg  1 lozenge Oral PRN Newt Minion, MD       Or  . phenol (CHLORASEPTIC) mouth spray 1 spray  1 spray Mouth/Throat PRN Newt Minion, MD      . methocarbamol (ROBAXIN) tablet 500 mg  500 mg Oral Q6H PRN Newt Minion, MD   500 mg at 04/16/17 1051   Or  . methocarbamol (ROBAXIN) 500 mg in dextrose 5 % 50 mL IVPB  500 mg Intravenous Q6H PRN Newt Minion, MD      . metoCLOPramide (REGLAN) tablet 5-10 mg  5-10 mg Oral Q8H PRN Newt Minion, MD       Or  . metoCLOPramide (REGLAN) injection 5-10 mg  5-10 mg Intravenous Q8H PRN Newt Minion, MD      . mometasone-formoterol [facility]) 200-5 MCG/ACT inhaler 2 puff  2 puff Inhalation BID Newt Minion, MD   2 puff at 04/17/17 0739  . ondansetron (ZOFRAN) tablet 4 mg  4 mg Oral Q6H PRN Newt Minion, MD       Or  . ondansetron  [facility]) injection 4 mg  4 mg Intravenous Q6H PRN Newt Minion, MD      . oxyCODONE (Oxy IR/ROXICODONE) immediate release tablet 5-10 mg  5-10 mg Oral Q3H PRN Newt Minion, MD   10 mg at 04/17/17 1124  . polyethylene glycol (MIRALAX / GLYCOLAX) packet 17 g  17 g Oral Daily PRN Newt Minion, MD      . senna-docusate (Senokot-S) tablet 1 tablet  1 tablet Oral QHS PRN Newt Minion, MD      . triamterene-hydrochlorothiazide (DYAZIDE) 37.5-25 MG per capsule 1 capsule  1 capsule Oral Daily Newt Minion, MD   1 capsule at 04/17/17 0818     Discharge Medications: Please see discharge summary for a list of discharge medications.  Relevant Imaging Results:  Relevant Lab Results:   Additional Information SS:[address] [place], LCSW

## 2017-04-18 DIAGNOSIS — Z471 Aftercare following joint replacement surgery: Secondary | ICD-10-CM | POA: Diagnosis not present

## 2017-04-18 DIAGNOSIS — R2689 Other abnormalities of gait and mobility: Secondary | ICD-10-CM | POA: Diagnosis not present

## 2017-04-18 DIAGNOSIS — K5900 Constipation, unspecified: Secondary | ICD-10-CM | POA: Diagnosis not present

## 2017-04-18 DIAGNOSIS — M1990 Unspecified osteoarthritis, unspecified site: Secondary | ICD-10-CM | POA: Diagnosis not present

## 2017-04-18 DIAGNOSIS — Z96651 Presence of right artificial knee joint: Secondary | ICD-10-CM | POA: Diagnosis not present

## 2017-04-18 DIAGNOSIS — M1711 Unilateral primary osteoarthritis, right knee: Secondary | ICD-10-CM | POA: Diagnosis not present

## 2017-04-18 DIAGNOSIS — M1710 Unilateral primary osteoarthritis, unspecified knee: Secondary | ICD-10-CM | POA: Diagnosis not present

## 2017-04-18 DIAGNOSIS — Z7409 Other reduced mobility: Secondary | ICD-10-CM | POA: Diagnosis not present

## 2017-04-18 DIAGNOSIS — G8918 Other acute postprocedural pain: Secondary | ICD-10-CM | POA: Diagnosis not present

## 2017-04-18 DIAGNOSIS — Z5189 Encounter for other specified aftercare: Secondary | ICD-10-CM | POA: Diagnosis not present

## 2017-04-18 LAB — GLUCOSE, CAPILLARY: Glucose-Capillary: 99 mg/dL (ref 65–99)

## 2017-04-18 MED ORDER — OXYCODONE-ACETAMINOPHEN 5-325 MG PO TABS: 1.0000 | ORAL_TABLET | ORAL | 0 refills | Status: DC | PRN

## 2017-04-18 MED ORDER — ASPIRIN EC 325 MG PO TBEC: 325.0000 mg | DELAYED_RELEASE_TABLET | Freq: Every day | ORAL | 0 refills | Status: DC

## 2017-04-18 NOTE — Progress Notes (Signed)
Patient ID: Sarah Dodson, female   DOB: March 12, 1961, 56 y.o.   MRN: [id number] Postoperative day 2 total knee arthroplasty. Orders written for discharge to skilled nursing for discharge today.

## 2017-04-18 NOTE — Progress Notes (Signed)
Pt ready for d/c to SNF today per MD. Report called to [place] at [facility], all questions answered. Belongings sent with pt at d/c. Pt transferred to facility via [facility].   [place], Jerry Caras

## 2017-04-18 NOTE — Progress Notes (Signed)
Patient will discharge to [facility] Anticipated discharge date: 7/13 Family notified: at bedside Transportation by [facility]- scheduled for 2:30pm Report #: [phone]  CSW signing off.  Jorge Ny, LCSW Clinical Social Worker [phone]

## 2017-04-18 NOTE — Progress Notes (Signed)
Pt chooses [facility] for rehab- they can accept patient today- per MD note pt ready for DC will need DC summary prior to transfer to SNF  Jorge Ny, [place] Social Worker [phone]

## 2017-04-18 NOTE — Discharge Summary (Signed)
Discharge Diagnoses:  Active Problems:   Total knee replacement status, right   Surgeries: Procedure(s): RIGHT TOTAL KNEE ARTHROPLASTY on 04/16/2017    Consultants:   Discharged Condition: Improved  Hospital Course: Sarah Dodson is an 56 y.o. female who was admitted 04/16/2017 with a chief complaint of osteoarthritis right knee , with a final diagnosis of Osteoarthritis Right Knee.  Patient was brought to the operating room on 04/16/2017 and underwent Procedure(s): RIGHT TOTAL KNEE ARTHROPLASTY.    Patient was given perioperative antibiotics: Anti-infectives    Start     Dose/Rate Route Frequency Ordered Stop   04/16/17 1500  ceFAZolin (ANCEF) IVPB 2g/100 mL premix     2 g 200 mL/hr over 30 Minutes Intravenous Every 6 hours 04/16/17 1330 04/16/17 2130   04/16/17 0800  ceFAZolin (ANCEF) 3 g in dextrose 5 % 50 mL IVPB     3 g 130 mL/hr over 30 Minutes Intravenous To [facility] 04/15/17 0855 04/16/17 1531    .  Patient was given sequential compression devices, early ambulation, and aspirin for DVT prophylaxis.  Recent vital signs: Patient Vitals for the past 24 hrs:  BP Temp Temp src Pulse Resp SpO2  04/18/17 0756 - - - - - 95 %  04/18/17 0539 117/66 99.4 F (37.4 C) Oral 88 19 91 %  04/17/17 2036 - - - - - 99 %  04/17/17 2022 134/68 99.3 F (37.4 C) Oral 85 18 98 %  04/17/17 1500 (!) 122/59 98.6 F (37 C) Oral 72 19 97 %  .  Recent laboratory studies: No results found.  Discharge Medications:   Allergies as of 04/18/2017      Reactions   Latex Itching      Medication List    STOP taking these medications   diclofenac 75 MG EC tablet Commonly known as:  VOLTAREN     TAKE these medications   ALAVERT 10 MG tablet Generic drug:  loratadine Take 10 mg by mouth daily.   albuterol 108 (90 Base) MCG/ACT inhaler Commonly known as:  PROVENTIL HFA;VENTOLIN HFA Inhale 2 puffs into the lungs every 6 (six) hours as needed for wheezing or shortness of breath.    aspirin EC 325 MG tablet Take 1 tablet (325 mg total) by mouth daily.   CALTRATE 600+D PLUS MINERALS 600-800 MG-UNIT Chew Chew 2 each by mouth daily.   fluticasone 50 MCG/ACT nasal spray Commonly known as:  FLONASE Place 1 spray into both nostrils daily as needed for allergies or rhinitis.   Fluticasone-Salmeterol 250-50 MCG/DOSE Aepb Commonly known as:  ADVAIR Inhale 1 puff into the lungs 2 (two) times daily.   multivitamin with minerals tablet Take 1 tablet by mouth daily.   oxyCODONE-acetaminophen 5-325 MG tablet Commonly known as:  ROXICET Take 1 tablet by mouth every 4 (four) hours as needed for severe pain.   triamterene-hydrochlorothiazide 37.5-25 MG capsule Commonly known as:  DYAZIDE Take 1 capsule by mouth daily.       Diagnostic Studies: No results found.  Patient benefited maximally from their hospital stay and there were no complications.     Disposition: 01-Home or Self Care Discharge Instructions    Call MD / Call 911    Complete by:  As directed    If you experience chest pain or shortness of breath, CALL 911 and be transported to the hospital emergency room.  If you develope a fever above 101 F, pus (white drainage) or increased drainage or redness at the wound, or  calf pain, call your surgeon's office.   Constipation Prevention    Complete by:  As directed    Drink plenty of fluids.  Prune juice may be helpful.  You may use a stool softener, such as Colace (over the counter) 100 mg twice a day.  Use MiraLax (over the counter) for constipation as needed.   Diet - low sodium heart healthy    Complete by:  As directed    Increase activity slowly as tolerated    Complete by:  As directed    Weight bearing as tolerated    Complete by:  As directed    Laterality:  right   Extremity:  Lower     Follow-up Information    Newt Minion, MD Follow up in 1 week(s).   Specialty:  Orthopedic Surgery Contact information: [place] [address] [phone]            Signed: Newt Minion 04/18/2017, 12:19 PM

## 2017-04-18 NOTE — Plan of Care (Signed)
Problem: Safety: Goal: Ability to remain free from injury will improve Outcome: Progressing No safety issues noted, safety precautions maintained  Problem: Pain Managment: Goal: General experience of comfort will improve Outcome: Progressing Medicated once for pain with full relief  Problem: Physical Regulation: Goal: Will remain free from infection Outcome: Progressing No signs of infection noted  Problem: Skin Integrity: Goal: Risk for impaired skin integrity will decrease Outcome: Progressing No skin issues noted, dressing clean, dry and intact to right knee      Problem: Tissue Perfusion: Goal: Risk factors for ineffective tissue perfusion will decrease Outcome: Progressing No S/S of DVT noted  Problem: Activity: Goal: Risk for activity intolerance will decrease Outcome: Progressing Up to [facility] frequently with assistance, she tolerates well  Problem: Bowel/Gastric: Goal: Will not experience complications related to bowel motility Outcome: Progressing No gastric or bowel issues reported

## 2017-04-18 NOTE — Progress Notes (Signed)
OT  Note  Patient Details Name: Sarah Dodson MRN: [id number] DOB: 1961-05-02   Cancelled Treatment:    Reason Eval/Treat Not Completed: OT screened, defer to snf at this time. Pt with pending d/c to [facility] today.   Vonita Moss   OTR/L Pager: [phone] Office: [phone] .  04/18/2017, 12:01 PM

## 2017-04-18 NOTE — Progress Notes (Signed)
Physical Therapy Treatment Patient Details Name: Sarah Dodson MRN: [id number] DOB: 04-25-1961 Today's Date: 04/18/2017    History of Present Illness Patient is a 56 y/o female admitted for R TKA.  PMH positive for HTN, GERD, obesity, asthma and arthritis.    PT Comments    Pt still requiring VC's and assistance with transfers and gait training. Pt requested to only ambulate prior to D/C to SNF following tx.    Follow Up Recommendations  SNF;Supervision/Assistance - 24 hour     Equipment Recommendations  Rolling walker with 5" wheels    Recommendations for Other Services       Precautions / Restrictions Precautions Precautions: Knee Precaution Booklet Issued: Yes (comment) Restrictions Weight Bearing Restrictions: Yes RLE Weight Bearing: Weight bearing as tolerated    Mobility  Bed Mobility                  Transfers Overall transfer level: Needs assistance Equipment used: Rolling walker (2 wheeled) Transfers: Sit to/from Stand Sit to Stand: Min assist         General transfer comment: cues for hand placement, RLE advancement   Ambulation/Gait Ambulation/Gait assistance: Min assist Ambulation Distance (Feet): 70 Feet Assistive device: Rolling walker (2 wheeled) Gait Pattern/deviations: Step-through pattern;Decreased stance time - right;Decreased step length - right;Decreased step length - left;Decreased weight shift to right;Trunk flexed Gait velocity: slow   General Gait Details: VC's for posture, stance time on RLE, heel strike/toe off for increased knee extension    Stairs            Wheelchair Mobility    Modified Rankin (Stroke Patients Only)       Balance Overall balance assessment: Needs assistance Sitting-balance support: No upper extremity supported;Feet supported Sitting balance-Leahy Scale: Good     Standing balance support: Bilateral upper extremity supported;During functional activity Standing balance-Leahy Scale:  Fair Standing balance comment: static standing with no UE support.                            Cognition Arousal/Alertness: Awake/alert Behavior During Therapy: WFL for tasks assessed/performed Overall Cognitive Status: Within Functional Limits for tasks assessed                                        Exercises    General Comments        Pertinent Vitals/Pain Pain Assessment: 0-10 Pain Score: 0-No pain Pain Location: No pain at rest, R knee pain with movement Pain Descriptors / Indicators: Sore Pain Intervention(s): Monitored during session    Home Living                      Prior Function            PT Goals (current goals can now be found in the care plan section) Acute Rehab PT Goals Patient Stated Goal: to D/C and go to the SNF PT Goal Formulation: With patient Time For Goal Achievement: 04/18/17 Potential to Achieve Goals: Good Progress towards PT goals: Progressing toward goals    Frequency    7X/week      PT Plan Current plan remains appropriate    Co-evaluation              AM-PAC PT "6 Clicks" Daily Activity  Outcome Measure  Difficulty turning over in bed (including adjusting  bedclothes, sheets and blankets)?: A Little Difficulty moving from lying on back to sitting on the side of the bed? : Total Difficulty sitting down on and standing up from a chair with arms (e.g., wheelchair, bedside commode, etc,.)?: A Lot Help needed moving to and from a bed to chair (including a wheelchair)?: A Little Help needed walking in hospital room?: A Little Help needed climbing 3-5 steps with a railing? : A Little 6 Click Score: 15    End of Session Equipment Utilized During Treatment: Gait belt Activity Tolerance: Patient tolerated treatment well Patient left: in chair;with call bell/phone within reach   PT Visit Diagnosis: Pain;Difficulty in walking, not elsewhere classified (R26.2) Pain - Right/Left: Right Pain -  part of body: Knee     Time: 1410-1427 PT Time Calculation (min) (ACUTE ONLY): 17 min  Charges:  $Gait Training: 8-22 mins                   G Codes:       Stamatia Masri, SPTA [id number]    Leeona Mccardle 04/18/2017, 2:51 PM

## 2017-04-18 NOTE — Progress Notes (Signed)
Physical Therapy Treatment Patient Details Name: Sarah Dodson MRN: [id number] DOB: 03-Feb-1961 Today's Date: 04/18/2017    History of Present Illness Patient is a 56 y/o female admitted for R TKA.  PMH positive for HTN, GERD, obesity, asthma and arthritis.    PT Comments    Pt still requiring cues for transfers and gait. Increased distance with gait.  Pt remains to require +1 external assistance with mobility and will continue to benefit for short term placement at SNF to improve functional mobility before return home.   Pt to D/C tday to a SNF per SW.   Follow Up Recommendations  SNF;Supervision/Assistance - 24 hour     Equipment Recommendations  Rolling walker with 5" wheels    Recommendations for Other Services       Precautions / Restrictions Precautions Precautions: Knee Precaution Booklet Issued: Yes (comment) Restrictions Weight Bearing Restrictions: Yes RLE Weight Bearing: Weight bearing as tolerated    Mobility  Bed Mobility                  Transfers Overall transfer level: Needs assistance Equipment used: Rolling walker (2 wheeled) Transfers: Sit to/from Stand Sit to Stand: Min assist         General transfer comment: cues for hand placement, RLE advancement   Ambulation/Gait Ambulation/Gait assistance: Min assist Ambulation Distance (Feet): 150 Feet Assistive device: Rolling walker (2 wheeled) Gait Pattern/deviations: Step-through pattern;Decreased stance time - right;Decreased step length - right;Decreased step length - left;Decreased weight shift to right;Trunk flexed Gait velocity: slow   General Gait Details: VC's for posture, stance time on RLE, looking straight ahead, heel strike/toe off for increased knee extension    Stairs            Wheelchair Mobility    Modified Rankin (Stroke Patients Only)       Balance Overall balance assessment: Needs assistance Sitting-balance support: No upper extremity supported;Feet  supported Sitting balance-Leahy Scale: Good     Standing balance support: Bilateral upper extremity supported;During functional activity Standing balance-Leahy Scale: Fair Standing balance comment: requires UE support from AD                             Cognition Arousal/Alertness: Awake/alert Behavior During Therapy: WFL for tasks assessed/performed Overall Cognitive Status: Within Functional Limits for tasks assessed                                        Exercises Total Joint Exercises Ankle Circles/Pumps: AROM;Both;20 reps;Seated Quad Sets: AROM;Right;10 reps;Seated Towel Squeeze: AROM;10 reps;Both;Seated Short Arc Quad: AAROM;Right;10 reps;Seated Heel Slides: AROM;Right;Seated;10 reps Hip ABduction/ADduction: AROM;10 reps;Right;Seated Goniometric ROM: 10 - 75 degress R knee    General Comments        Pertinent Vitals/Pain Pain Score: 0-No pain Pain Location: No pain at rest, R knee pain with movement Pain Descriptors / Indicators: Sore Pain Intervention(s): Ice applied;Monitored during session    Home Living                      Prior Function            PT Goals (current goals can now be found in the care plan section) Acute Rehab PT Goals Patient Stated Goal: To get better at the rehab facility so she can go home and get back to aquatic therapy.  PT Goal Formulation: With patient Time For Goal Achievement: 04/23/17 Potential to Achieve Goals: Good Progress towards PT goals: Progressing toward goals    Frequency    7X/week      PT Plan Current plan remains appropriate    Co-evaluation              AM-PAC PT "6 Clicks" Daily Activity  Outcome Measure  Difficulty turning over in bed (including adjusting bedclothes, sheets and blankets)?: A Little Difficulty moving from lying on back to sitting on the side of the bed? : Total Difficulty sitting down on and standing up from a chair with arms (e.g.,  wheelchair, bedside commode, etc,.)?: A Lot Help needed moving to and from a bed to chair (including a wheelchair)?: A Little Help needed walking in hospital room?: A Little Help needed climbing 3-5 steps with a railing? : A Little 6 Click Score: 15    End of Session Equipment Utilized During Treatment: Gait belt Activity Tolerance: Patient tolerated treatment well Patient left: in chair;with call bell/phone within reach   PT Visit Diagnosis: Pain;Difficulty in walking, not elsewhere classified (R26.2) Pain - Right/Left: Right Pain - part of body: Knee     Time: 1017-1050 PT Time Calculation (min) (ACUTE ONLY): 33 min  Charges:  $Gait Training: 8-22 mins $Therapeutic Exercise: 8-22 mins                    G Codes:       Majid Mccravy, SPTA [id number]    Tydarius Yawn 04/18/2017, 11:17 AM

## 2017-04-18 NOTE — Clinical Social Work Placement (Signed)
   CLINICAL SOCIAL WORK PLACEMENT  NOTE  Date:  04/18/2017  Patient Details  Name: Sarah Dodson MRN: [id number] Date of Birth: [birth year]  Clinical Social Work is seeking post-discharge placement for this patient at the [place] level of care (*CSW will initial, date and re-position this form in  chart as items are completed):  Yes   Patient/family provided with [facility] Work Department's list of facilities offering this level of care within the geographic area requested by the patient (or if unable, by the patient's family).  Yes   Patient/family informed of their freedom to choose among providers that offer the needed level of care, that participate in Medicare, Medicaid or managed care program needed by the patient, have an available bed and are willing to accept the patient.  Yes   Patient/family informed of [facility]'s ownership interest in [facility] and [facility], as well as of the fact that they are under no obligation to receive care at these facilities.  PASRR submitted to [facility] on 04/17/17     PASRR number received on 04/17/17     Existing PASRR number confirmed on       FL2 transmitted to all facilities in geographic area requested by pt/family on 04/17/17     FL2 transmitted to all facilities within larger geographic area on       Patient informed that his/her managed care company has contracts with or will negotiate with certain facilities, including the following:        Yes   Patient/family informed of bed offers received.  Patient [facility] bed at [facility]     Physician recommends and patient chooses bed at      Patient to be transferred to [facility] on 04/18/17.  Patient to be transferred to facility by ptar     Patient family notified on 04/18/17 of transfer.  Name of family member notified:  pt notified family     PHYSICIAN Please sign FL2     Additional Comment:     _______________________________________________ Jorge Ny, LCSW 04/18/2017, 12:25 PM

## 2017-04-23 DIAGNOSIS — R2689 Other abnormalities of gait and mobility: Secondary | ICD-10-CM | POA: Diagnosis not present

## 2017-04-24 DIAGNOSIS — Z7409 Other reduced mobility: Secondary | ICD-10-CM | POA: Diagnosis not present

## 2017-04-24 DIAGNOSIS — M1990 Unspecified osteoarthritis, unspecified site: Secondary | ICD-10-CM | POA: Diagnosis not present

## 2017-04-24 DIAGNOSIS — Z5189 Encounter for other specified aftercare: Secondary | ICD-10-CM | POA: Diagnosis not present

## 2017-04-25 ENCOUNTER — Ambulatory Visit (INDEPENDENT_AMBULATORY_CARE_PROVIDER_SITE_OTHER): Payer: 59 | Admitting: Family

## 2017-04-25 ENCOUNTER — Ambulatory Visit (INDEPENDENT_AMBULATORY_CARE_PROVIDER_SITE_OTHER): Payer: 59

## 2017-04-25 ENCOUNTER — Encounter (INDEPENDENT_AMBULATORY_CARE_PROVIDER_SITE_OTHER): Payer: Self-pay | Admitting: Family

## 2017-04-25 VITALS — Ht 62.0 in | Wt 332.0 lb

## 2017-04-25 DIAGNOSIS — Z96651 Presence of right artificial knee joint: Secondary | ICD-10-CM | POA: Diagnosis not present

## 2017-04-25 NOTE — Progress Notes (Signed)
   Post-Op Visit Note   Patient: Sarah Dodson           Date of Birth: 1960-12-07           MRN: [id number] Visit Date: 04/25/2017 PCP: Lavone Orn, MD  Chief Complaint:  Chief Complaint  Patient presents with  . Right Knee - Routine Post Op    HPI:  The patient is a 8 56-year-old woman who is seen today one week status post right total knee arthroplasty. She is residing in skilled nursing for her rehabilitation.    Ortho Exam Incision well proximally with staples there is no erythema drainage or gaping no sign of infection. Has full extension does have flexion to about 80.  Visit Diagnoses:  1. Total knee replacement status, right     Plan: He'll follow-up in about a week for staple removal. Encouraged her to continue working with physical therapy. Clean incision daily and apply dry dressing.  Follow-Up Instructions: Return in about 1 week (around 05/02/2017).   Imaging: Xr Knee 1-2 Views Right  Result Date: 04/25/2017 Radiographs of the right knee show stable alignment of arthroplasty. No complicating features.    Orders:  Orders Placed This Encounter  Procedures  . XR Knee 1-2 Views Right   No orders of the defined types were placed in this encounter.    PMFS History: Patient Active Problem List   Diagnosis Date Noted  . Total knee replacement status, right 04/16/2017  . Unilateral primary osteoarthritis, right knee 09/26/2016   Past Medical History:  Diagnosis Date  . Arthritis   . Asthma   . Bronchitis   . Esophagitis   . GERD (gastroesophageal reflux disease)   . Hypertension   . Obesity   . PONV (postoperative nausea and vomiting)    nausea with the LEFT ankle surgery    No family history on file.  Past Surgical History:  Procedure Laterality Date  . ANKLE ARTHROSCOPY Left    left 2014/ right 2016  . ANKLE ARTHROSCOPY Right 01/11/2015   Procedure: RIGHT ANKLE ARTHROSCOPY;  Surgeon: Newt Minion, MD;  Location: [place];  Service:  Orthopedics;  Laterality: Right;  . BIOPSY BREAST Right   . HYSTEROSCOPY W/D&C N/A 09/05/2016   Procedure: DILATATION AND CURETTAGE /HYSTEROSCOPY;  Surgeon: Molli Posey, MD;  Location: [facility];  Service: Gynecology;  Laterality: N/A;  . TOTAL KNEE ARTHROPLASTY Right 04/16/2017  . TOTAL KNEE ARTHROPLASTY Right 04/16/2017   Procedure: RIGHT TOTAL KNEE ARTHROPLASTY;  Surgeon: Newt Minion, MD;  Location: [place];  Service: Orthopedics;  Laterality: Right;   Social History   Occupational History  . Not on file.   Social History Main Topics  . Smoking status: Former Smoker    Packs/day: 1.00    Years: 35.00    Quit date: 01/08/2009  . Smokeless tobacco: Never Used  . Alcohol use No  . Drug use: No  . Sexual activity: Not on file

## 2017-04-28 DIAGNOSIS — R2689 Other abnormalities of gait and mobility: Secondary | ICD-10-CM | POA: Diagnosis not present

## 2017-04-30 ENCOUNTER — Encounter (INDEPENDENT_AMBULATORY_CARE_PROVIDER_SITE_OTHER): Payer: Self-pay | Admitting: Family

## 2017-04-30 ENCOUNTER — Ambulatory Visit (INDEPENDENT_AMBULATORY_CARE_PROVIDER_SITE_OTHER): Payer: 59 | Admitting: Family

## 2017-04-30 VITALS — Ht 62.0 in | Wt 332.0 lb

## 2017-04-30 DIAGNOSIS — Z96651 Presence of right artificial knee joint: Secondary | ICD-10-CM

## 2017-04-30 NOTE — Progress Notes (Signed)
   Post-Op Visit Note   Patient: Sarah Dodson           Date of Birth: [birth year]           MRN: [id number] Visit Date: 04/30/2017 PCP: Lavone Orn, MD  Chief Complaint:  Chief Complaint  Patient presents with  . Right Knee - Routine Post Op    04/16/17 right total knee replacement     HPI:  Patient is a 56 year old woman seen 2 weeks status post right total knee arthroplasty. Has been progressing well with therapy. Residing at [facility] for rehab. Using straight cane today.     Ortho Exam Incision is healing well. Is clean dry and intact. Staples harvested today. No erythema. Full extension. Flexion to 90.  Visit Diagnoses:  1. Total knee replacement status, right     Plan: continue aggressive PT. Follow up in office in 4 weeks with repeat radiographs.  Follow-Up Instructions: Return in about 4 weeks (around 05/28/2017).   Imaging: No results found.  Orders:  No orders of the defined types were placed in this encounter.  No orders of the defined types were placed in this encounter.    PMFS History: Patient Active Problem List   Diagnosis Date Noted  . Total knee replacement status, right 04/16/2017  . Unilateral primary osteoarthritis, right knee 09/26/2016   Past Medical History:  Diagnosis Date  . Arthritis   . Asthma   . Bronchitis   . Esophagitis   . GERD (gastroesophageal reflux disease)   . Hypertension   . Obesity   . PONV (postoperative nausea and vomiting)    nausea with the LEFT ankle surgery    No family history on file.  Past Surgical History:  Procedure Laterality Date  . ANKLE ARTHROSCOPY Left    left 2014/ right 2016  . ANKLE ARTHROSCOPY Right 01/11/2015   Procedure: RIGHT ANKLE ARTHROSCOPY;  Surgeon: Newt Minion, MD;  Location: [place];  Service: Orthopedics;  Laterality: Right;  . BIOPSY BREAST Right   . HYSTEROSCOPY W/D&C N/A 09/05/2016   Procedure: DILATATION AND CURETTAGE /HYSTEROSCOPY;  Surgeon: Molli Posey, MD;   Location: [facility];  Service: Gynecology;  Laterality: N/A;  . TOTAL KNEE ARTHROPLASTY Right 04/16/2017  . TOTAL KNEE ARTHROPLASTY Right 04/16/2017   Procedure: RIGHT TOTAL KNEE ARTHROPLASTY;  Surgeon: Newt Minion, MD;  Location: [facility];  Service: Orthopedics;  Laterality: Right;   Social History   Occupational History  . Not on file.   Social History Main Topics  . Smoking status: Former Smoker    Packs/day: 1.00    Years: 35.00    Quit date: 01/08/2009  . Smokeless tobacco: Never Used  . Alcohol use No  . Drug use: No  . Sexual activity: Not on file

## 2017-05-01 DIAGNOSIS — K5900 Constipation, unspecified: Secondary | ICD-10-CM | POA: Diagnosis not present

## 2017-05-01 DIAGNOSIS — Z96651 Presence of right artificial knee joint: Secondary | ICD-10-CM | POA: Diagnosis not present

## 2017-05-01 DIAGNOSIS — G8918 Other acute postprocedural pain: Secondary | ICD-10-CM | POA: Diagnosis not present

## 2017-05-02 ENCOUNTER — Ambulatory Visit (INDEPENDENT_AMBULATORY_CARE_PROVIDER_SITE_OTHER): Payer: 59 | Admitting: Family

## 2017-05-05 ENCOUNTER — Telehealth (INDEPENDENT_AMBULATORY_CARE_PROVIDER_SITE_OTHER): Payer: Self-pay | Admitting: Orthopedic Surgery

## 2017-05-05 DIAGNOSIS — Z471 Aftercare following joint replacement surgery: Secondary | ICD-10-CM | POA: Diagnosis not present

## 2017-05-05 DIAGNOSIS — M6281 Muscle weakness (generalized): Secondary | ICD-10-CM | POA: Diagnosis not present

## 2017-05-05 DIAGNOSIS — Z96651 Presence of right artificial knee joint: Secondary | ICD-10-CM | POA: Diagnosis not present

## 2017-05-05 NOTE — Telephone Encounter (Signed)
Walk-in Pt is requesting rf on pain meds-percocets

## 2017-05-05 NOTE — Telephone Encounter (Signed)
Pt is s/p a total knee replacement on 04/16/17 percocet given 04/18/17 #60 and 05/02/17 #10 is here now and requesting refill

## 2017-05-06 ENCOUNTER — Other Ambulatory Visit (INDEPENDENT_AMBULATORY_CARE_PROVIDER_SITE_OTHER): Payer: Self-pay | Admitting: Orthopedic Surgery

## 2017-05-06 MED ORDER — OXYCODONE-ACETAMINOPHEN 5-325 MG PO TABS: 1.0000 | ORAL_TABLET | Freq: Three times a day (TID) | ORAL | 0 refills | Status: DC | PRN

## 2017-05-06 NOTE — Telephone Encounter (Signed)
rx written

## 2017-05-06 NOTE — Telephone Encounter (Signed)
Called pt to advise that rx is at front desk for pick up.

## 2017-05-07 ENCOUNTER — Telehealth (INDEPENDENT_AMBULATORY_CARE_PROVIDER_SITE_OTHER): Payer: Self-pay | Admitting: Orthopedic Surgery

## 2017-05-07 DIAGNOSIS — M25519 Pain in unspecified shoulder: Secondary | ICD-10-CM | POA: Diagnosis not present

## 2017-05-07 NOTE — Telephone Encounter (Signed)
Received a request for records from [facility]. However, it could not be processed as we do not have authorization. I faxed back to [facility] advising to fax [place] pts signed authorization so their request can to processed.

## 2017-05-12 ENCOUNTER — Other Ambulatory Visit (INDEPENDENT_AMBULATORY_CARE_PROVIDER_SITE_OTHER): Payer: Self-pay | Admitting: Orthopedic Surgery

## 2017-05-28 ENCOUNTER — Encounter (INDEPENDENT_AMBULATORY_CARE_PROVIDER_SITE_OTHER): Payer: Self-pay | Admitting: Family

## 2017-05-28 ENCOUNTER — Ambulatory Visit (INDEPENDENT_AMBULATORY_CARE_PROVIDER_SITE_OTHER): Payer: 59 | Admitting: Family

## 2017-05-28 ENCOUNTER — Ambulatory Visit (INDEPENDENT_AMBULATORY_CARE_PROVIDER_SITE_OTHER): Payer: 59

## 2017-05-28 VITALS — Ht 62.0 in | Wt 332.0 lb

## 2017-05-28 DIAGNOSIS — G8929 Other chronic pain: Secondary | ICD-10-CM

## 2017-05-28 DIAGNOSIS — M25561 Pain in right knee: Secondary | ICD-10-CM

## 2017-05-28 DIAGNOSIS — Z96651 Presence of right artificial knee joint: Secondary | ICD-10-CM

## 2017-05-28 MED ORDER — OXYCODONE-ACETAMINOPHEN 5-325 MG PO TABS: 1.0000 | ORAL_TABLET | Freq: Two times a day (BID) | ORAL | 0 refills | Status: DC | PRN

## 2017-05-28 NOTE — Progress Notes (Signed)
   Post-Op Visit Note   Patient: Sarah Dodson           Date of Birth: 13-Jun-1961           MRN: [id number] Visit Date: 05/28/2017 PCP: Lavone Orn, MD  Chief Complaint:  Chief Complaint  Patient presents with  . Right Knee - Routine Post Op    04/16/17 right total knee replacement     HPI:  The patient is a 56 year old woman who is about 6 weeks status post right total knee arthroplasty she's been doing well working on home therapy using stretch bands. Today wonders if she may return to water aerobics for therapy. She also wonders if she may resume her diclofenac. States is completed her aspirin course postop.    Ortho Exam Him on examination of her right knee the incision is well-healed there is no erythema or minimal swelling has full extension and flexion to 120.  Visit Diagnoses:  1. Total knee replacement status, right   2. Chronic pain of right knee     Plan: Provided a note for return to work. She may return to work on September 4 without restrictions. Continue working on her physical therapy will follow-up in office as needed.  Follow-Up Instructions: Return if symptoms worsen or fail to improve.   Imaging: No results found.  Orders:  Orders Placed This Encounter  Procedures  . XR Knee 1-2 Views Right   Meds ordered this encounter  Medications  . oxyCODONE-acetaminophen (ROXICET) 5-325 MG tablet    Sig: Take 1 tablet by mouth 2 (two) times daily as needed for severe pain.    Dispense:  30 tablet    Refill:  0     PMFS History: Patient Active Problem List   Diagnosis Date Noted  . Total knee replacement status, right 04/16/2017  . Unilateral primary osteoarthritis, right knee 09/26/2016   Past Medical History:  Diagnosis Date  . Arthritis   . Asthma   . Bronchitis   . Esophagitis   . GERD (gastroesophageal reflux disease)   . Hypertension   . Obesity   . PONV (postoperative nausea and vomiting)    nausea with the LEFT ankle surgery    No  family history on file.  Past Surgical History:  Procedure Laterality Date  . ANKLE ARTHROSCOPY Left    left 2014/ right 2016  . ANKLE ARTHROSCOPY Right 01/11/2015   Procedure: RIGHT ANKLE ARTHROSCOPY;  Surgeon: Newt Minion, MD;  Location: [place];  Service: Orthopedics;  Laterality: Right;  . BIOPSY BREAST Right   . HYSTEROSCOPY W/D&C N/A 09/05/2016   Procedure: DILATATION AND CURETTAGE /HYSTEROSCOPY;  Surgeon: Molli Posey, MD;  Location: [facility];  Service: Gynecology;  Laterality: N/A;  . TOTAL KNEE ARTHROPLASTY Right 04/16/2017  . TOTAL KNEE ARTHROPLASTY Right 04/16/2017   Procedure: RIGHT TOTAL KNEE ARTHROPLASTY;  Surgeon: Newt Minion, MD;  Location: [place];  Service: Orthopedics;  Laterality: Right;   Social History   Occupational History  . Not on file.   Social History Main Topics  . Smoking status: Former Smoker    Packs/day: 1.00    Years: 35.00    Quit date: 01/08/2009  . Smokeless tobacco: Never Used  . Alcohol use No  . Drug use: No  . Sexual activity: Not on file

## 2017-05-29 ENCOUNTER — Other Ambulatory Visit (INDEPENDENT_AMBULATORY_CARE_PROVIDER_SITE_OTHER): Payer: Self-pay | Admitting: Orthopedic Surgery

## 2017-06-16 ENCOUNTER — Telehealth (INDEPENDENT_AMBULATORY_CARE_PROVIDER_SITE_OTHER): Payer: Self-pay | Admitting: Orthopedic Surgery

## 2017-06-16 MED ORDER — CEPHALEXIN 500 MG PO CAPS: ORAL_CAPSULE | ORAL | 2 refills | Status: DC

## 2017-06-16 NOTE — Telephone Encounter (Signed)
Patient called left voicemail message needing an antibiotic when she go to her dental appointment. The number to contact patient is   [phone]

## 2017-06-16 NOTE — Telephone Encounter (Signed)
I called and spoke with patient advised antibiotic not required for dental prophylaxis. But we will definitely send her in one if her dentist office requires. This was sent in just in case. Directions explained to patient over the phone. She will call with any questions.

## 2017-07-28 DIAGNOSIS — M19079 Primary osteoarthritis, unspecified ankle and foot: Secondary | ICD-10-CM | POA: Diagnosis not present

## 2017-07-28 DIAGNOSIS — J4530 Mild persistent asthma, uncomplicated: Secondary | ICD-10-CM | POA: Diagnosis not present

## 2017-07-28 DIAGNOSIS — I10 Essential (primary) hypertension: Secondary | ICD-10-CM | POA: Diagnosis not present

## 2017-08-07 DIAGNOSIS — Z01419 Encounter for gynecological examination (general) (routine) without abnormal findings: Secondary | ICD-10-CM | POA: Diagnosis not present

## 2017-08-23 IMAGING — DX DG CHEST 2V
2 series · 2 of 2 positions shown · non-contrast
Comparison: None.

CLINICAL DATA: Chest pain.

EXAM:
CHEST  2 VIEW

[chest pa]
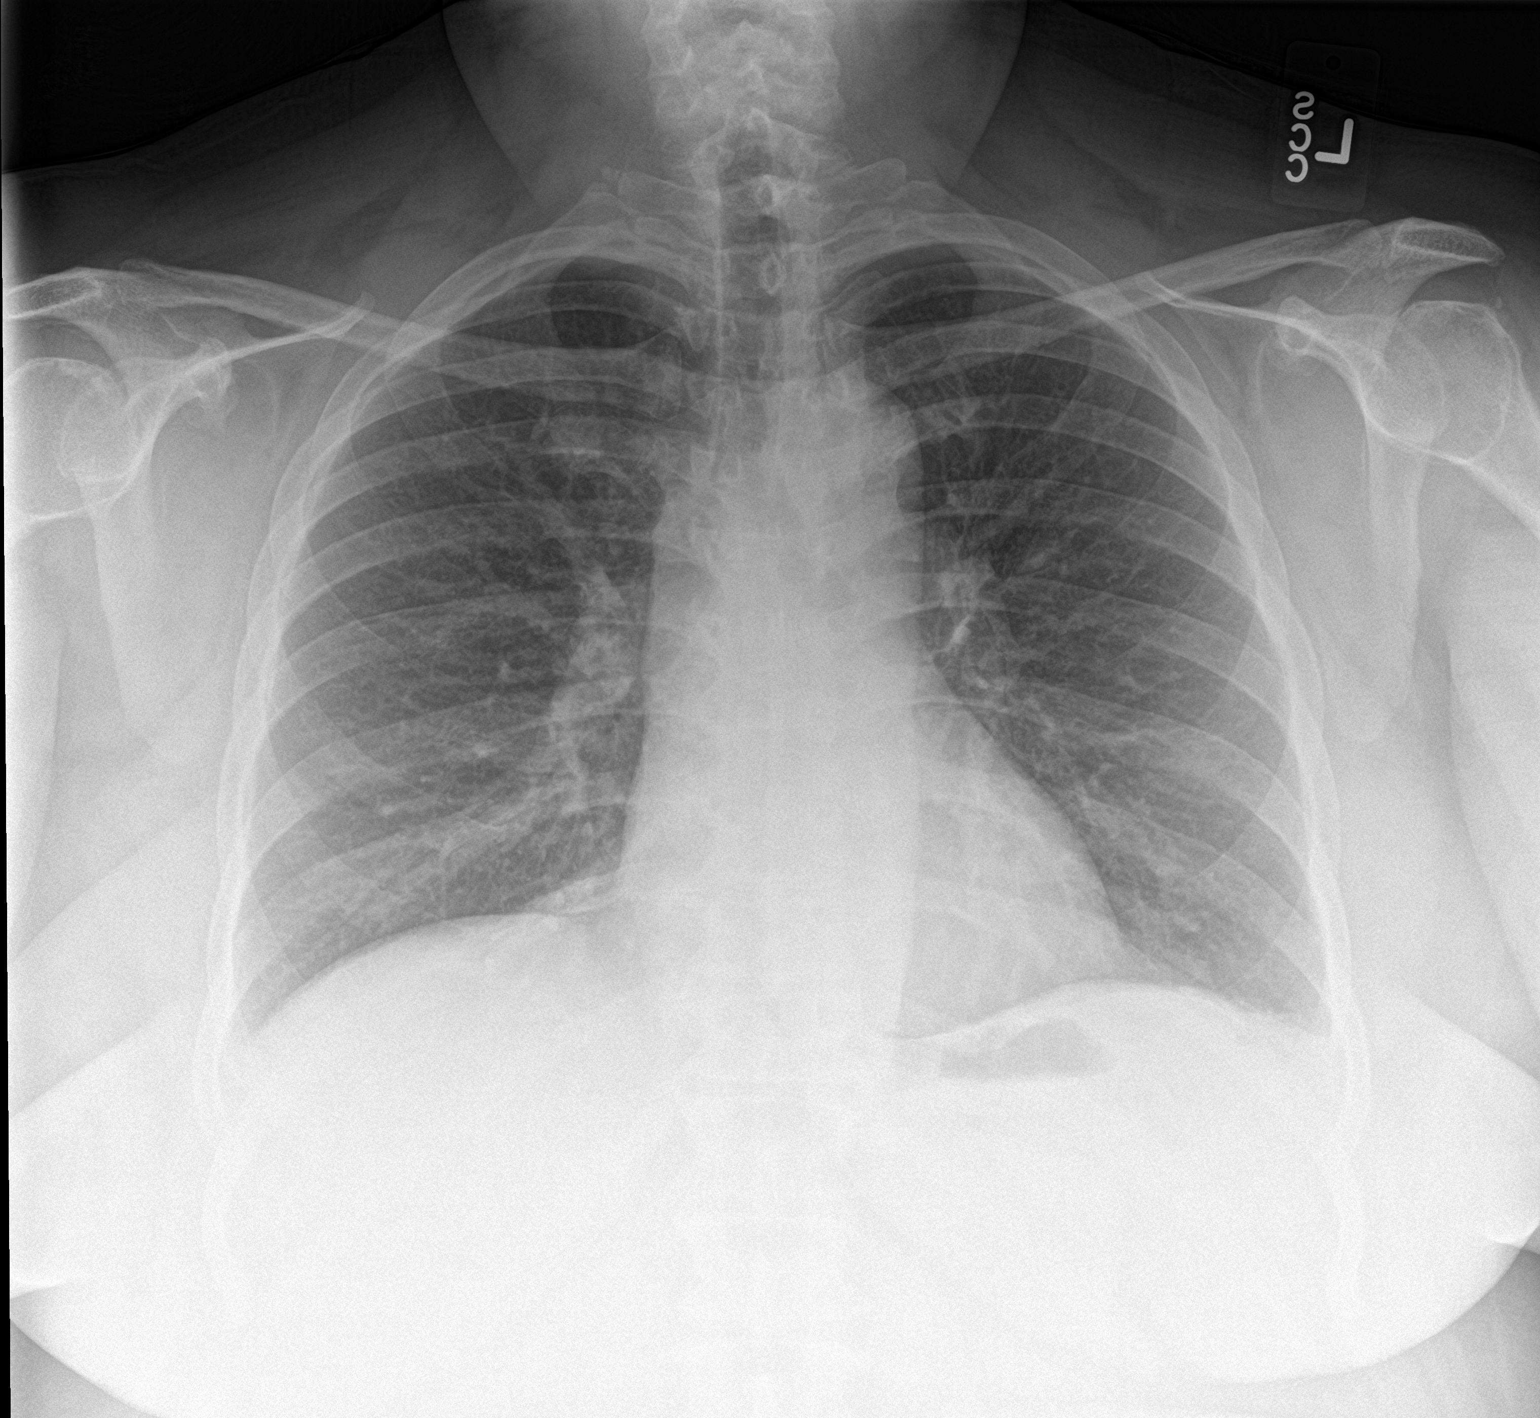

[chest lat]
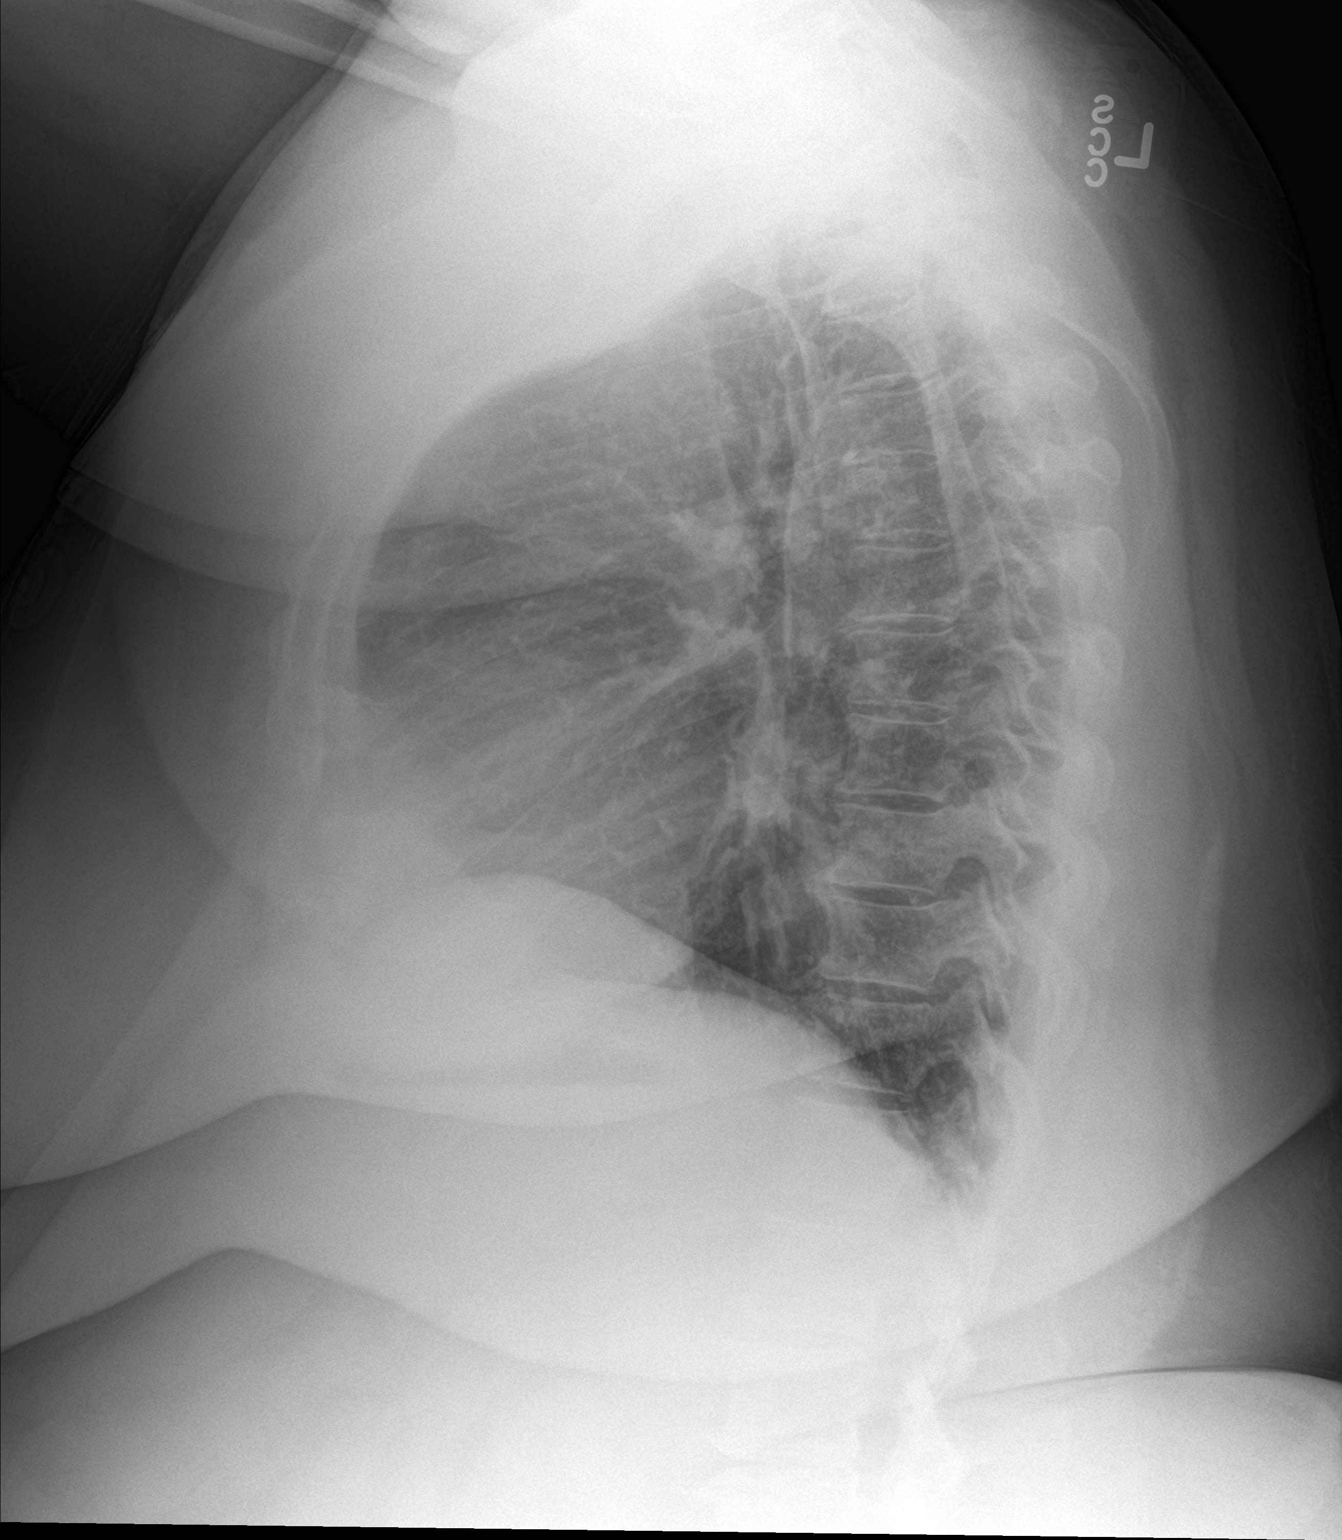

[2 of 2 positions shown; findings below may reference images not displayed]

FINDINGS: The heart size and mediastinal contours are within normal limits.
Both lungs are clear. The visualized skeletal structures are
unremarkable.
IMPRESSION: No active cardiopulmonary disease.

## 2017-09-16 DIAGNOSIS — J019 Acute sinusitis, unspecified: Secondary | ICD-10-CM | POA: Diagnosis not present

## 2017-09-16 DIAGNOSIS — R05 Cough: Secondary | ICD-10-CM | POA: Diagnosis not present

## 2017-12-15 ENCOUNTER — Telehealth (INDEPENDENT_AMBULATORY_CARE_PROVIDER_SITE_OTHER): Payer: Self-pay | Admitting: Orthopedic Surgery

## 2017-12-15 DIAGNOSIS — R05 Cough: Secondary | ICD-10-CM | POA: Diagnosis not present

## 2017-12-15 DIAGNOSIS — J0100 Acute maxillary sinusitis, unspecified: Secondary | ICD-10-CM | POA: Diagnosis not present

## 2017-12-15 DIAGNOSIS — I10 Essential (primary) hypertension: Secondary | ICD-10-CM | POA: Diagnosis not present

## 2017-12-15 NOTE — Telephone Encounter (Signed)
Pt is s/p a total knee from 04/2017. Dr. Sharol Given does not pre treat with ABX prior to dental visit. If there is something that is needed in witting I will be happy to send that to her. To call with any questions.

## 2017-12-15 NOTE — Telephone Encounter (Signed)
Dentist appt next week pt will need antibiotic for her visit   Send prescription  [facility]  [address] [place] [address] [phone]

## 2017-12-22 ENCOUNTER — Ambulatory Visit (INDEPENDENT_AMBULATORY_CARE_PROVIDER_SITE_OTHER): Payer: 59

## 2017-12-22 ENCOUNTER — Encounter (INDEPENDENT_AMBULATORY_CARE_PROVIDER_SITE_OTHER): Payer: Self-pay | Admitting: Orthopedic Surgery

## 2017-12-22 ENCOUNTER — Ambulatory Visit (INDEPENDENT_AMBULATORY_CARE_PROVIDER_SITE_OTHER): Payer: 59 | Admitting: Orthopedic Surgery

## 2017-12-22 VITALS — Ht 62.0 in | Wt 332.0 lb

## 2017-12-22 DIAGNOSIS — M4316 Spondylolisthesis, lumbar region: Secondary | ICD-10-CM | POA: Diagnosis not present

## 2017-12-22 DIAGNOSIS — M545 Low back pain, unspecified: Secondary | ICD-10-CM

## 2017-12-22 NOTE — Progress Notes (Signed)
Office Visit Note   Patient: Sarah Dodson           Date of Birth: 09/03/1961           MRN: [id number] Visit Date: 12/22/2017              Requested by: Lavone Orn, MD [address] [place], [place] [address] PCP: Lavone Orn, MD  Chief Complaint  Patient presents with  . Left Leg - Pain      HPI: Patient is a 57 year old woman who states that she was sitting on the commode when she went to get up she had severe radicular pain down the left side which radiated from her lumbar spine all the way down her leg and her foot.  Patient states the pain was greater than 10/10 and  describes this as a burning pain.  Assessment & Plan: Visit Diagnoses:  1. Lumbar spine pain   2. Spondylolisthesis, lumbar region     Plan: Recommended heat when this flares up recommended resuming her water aerobics.  Recommended that she increase her anti-inflammatory as needed during acute flareups.  Follow-Up Instructions: Return if symptoms worsen or fail to improve.   Ortho Exam  Patient is alert, oriented, no adenopathy, well-dressed, normal affect, normal respiratory effort. Examination patient has difficulty getting from a sitting to a standing position.  She has no pain with range of motion of the hip knee or ankle she has a negative straight leg raise she has no focal motor weakness.  No knee or hip symptoms.  Imaging: Xr Lumbar Spine 2-3 Views  Result Date: 12/22/2017 2 view radiographs of the lumbar spine shows a spondylolisthesis at L4-5 with osteophytic bone spurs and joint space narrowing.  No images are attached to the encounter.  Labs: No results found for: HGBA1C, ESRSEDRATE, CRP, LABURIC, REPTSTATUS, GRAMSTAIN, CULT, LABORGA  @LABSALLVALUES (HGBA1)@  Body mass index is 60.72 kg/m.  Orders:  Orders Placed This Encounter  Procedures  . XR Lumbar Spine 2-3 Views   No orders of the defined types were placed in this encounter.    Procedures: No  procedures performed  Clinical Data: No additional findings.  ROS:  All other systems negative, except as noted in the HPI. Review of Systems  Objective: Vital Signs: Ht 5\' 2"  (1.575 m)   Wt (!) 332 lb (150.6 kg)   BMI 60.72 kg/m   Specialty Comments:  No specialty comments available.  PMFS History: Patient Active Problem List   Diagnosis Date Noted  . Spondylolisthesis, lumbar region 12/22/2017  . Total knee replacement status, right 04/16/2017  . Unilateral primary osteoarthritis, right knee 09/26/2016   Past Medical History:  Diagnosis Date  . Arthritis   . Asthma   . Bronchitis   . Esophagitis   . GERD (gastroesophageal reflux disease)   . Hypertension   . Obesity   . PONV (postoperative nausea and vomiting)    nausea with the LEFT ankle surgery    No family history on file.  Past Surgical History:  Procedure Laterality Date  . ANKLE ARTHROSCOPY Left    left 2014/ right 2016  . ANKLE ARTHROSCOPY Right 01/11/2015   Procedure: RIGHT ANKLE ARTHROSCOPY;  Surgeon: Newt Minion, MD;  Location: [place];  Service: Orthopedics;  Laterality: Right;  . BIOPSY BREAST Right   . HYSTEROSCOPY W/D&C N/A 09/05/2016   Procedure: DILATATION AND CURETTAGE /HYSTEROSCOPY;  Surgeon: Molli Posey, MD;  Location: [facility];  Service: Gynecology;  Laterality: N/A;  .  TOTAL KNEE ARTHROPLASTY Right 04/16/2017  . TOTAL KNEE ARTHROPLASTY Right 04/16/2017   Procedure: RIGHT TOTAL KNEE ARTHROPLASTY;  Surgeon: Newt Minion, MD;  Location: [place];  Service: Orthopedics;  Laterality: Right;   Social History   Occupational History  . Not on file  Tobacco Use  . Smoking status: Former Smoker    Packs/day: 1.00    Years: 35.00    Pack years: 35.00    Last attempt to quit: 01/08/2009    Years since quitting: 8.9  . Smokeless tobacco: Never Used  Substance and Sexual Activity  . Alcohol use: No  . Drug use: No  . Sexual activity: Not on file

## 2018-01-30 DIAGNOSIS — I10 Essential (primary) hypertension: Secondary | ICD-10-CM | POA: Diagnosis not present

## 2018-01-30 DIAGNOSIS — Z0000 Encounter for general adult medical examination without abnormal findings: Secondary | ICD-10-CM | POA: Diagnosis not present

## 2018-01-30 DIAGNOSIS — Z13220 Encounter for screening for lipoid disorders: Secondary | ICD-10-CM | POA: Diagnosis not present

## 2018-01-30 DIAGNOSIS — J302 Other seasonal allergic rhinitis: Secondary | ICD-10-CM | POA: Diagnosis not present

## 2018-01-30 DIAGNOSIS — J4530 Mild persistent asthma, uncomplicated: Secondary | ICD-10-CM | POA: Diagnosis not present

## 2018-01-30 DIAGNOSIS — Z23 Encounter for immunization: Secondary | ICD-10-CM | POA: Diagnosis not present

## 2018-02-17 DIAGNOSIS — Z6841 Body Mass Index (BMI) 40.0 and over, adult: Secondary | ICD-10-CM | POA: Diagnosis not present

## 2018-02-17 DIAGNOSIS — I10 Essential (primary) hypertension: Secondary | ICD-10-CM | POA: Diagnosis not present

## 2018-03-03 ENCOUNTER — Other Ambulatory Visit (HOSPITAL_COMMUNITY): Payer: Self-pay | Admitting: General Surgery

## 2018-03-09 ENCOUNTER — Ambulatory Visit (HOSPITAL_COMMUNITY)
Admission: RE | Admit: 2018-03-09 | Discharge: 2018-03-09 | Disposition: A | Discharge status: Home / Self Care | Payer: 59 | Source: Ambulatory Visit | Attending: General Surgery | Admitting: General Surgery

## 2018-03-09 DIAGNOSIS — Z87891 Personal history of nicotine dependence: Secondary | ICD-10-CM | POA: Diagnosis not present

## 2018-03-09 DIAGNOSIS — K7689 Other specified diseases of liver: Secondary | ICD-10-CM | POA: Diagnosis not present

## 2018-03-09 DIAGNOSIS — K760 Fatty (change of) liver, not elsewhere classified: Secondary | ICD-10-CM | POA: Insufficient documentation

## 2018-03-09 DIAGNOSIS — Z01818 Encounter for other preprocedural examination: Secondary | ICD-10-CM | POA: Diagnosis not present

## 2018-03-25 ENCOUNTER — Encounter: Payer: 59 | Attending: General Surgery | Admitting: Skilled Nursing Facility1

## 2018-03-25 ENCOUNTER — Encounter: Payer: Self-pay | Admitting: Skilled Nursing Facility1

## 2018-03-25 DIAGNOSIS — Z6841 Body Mass Index (BMI) 40.0 and over, adult: Secondary | ICD-10-CM | POA: Insufficient documentation

## 2018-03-25 DIAGNOSIS — E669 Obesity, unspecified: Secondary | ICD-10-CM

## 2018-03-25 DIAGNOSIS — Z713 Dietary counseling and surveillance: Secondary | ICD-10-CM | POA: Insufficient documentation

## 2018-03-25 NOTE — Progress Notes (Signed)
Pre-Op Assessment Visit:  Pre-Operative Sleeve Gastrectomy Surgery  Medical Nutrition Therapy:  Appt start time: 4:24  End time:  5:29  Patient was seen on 03/25/2018 for Pre-Operative Nutrition Assessment. Assessment and letter of approval faxed to [facility] Bariatric Surgery Program coordinator on 03/25/2018   Pt states she is going t have the sleeve because she cannot stop taking the diflunac stating she only takes half of what she is prescribed.  Pts referral states she needs 6 months SWL.  Pt expectation of surgery: for no arthritis pain (dietitian educated the pt on this) and to be 180 pounds  Pt expectation of Dietitian: none  Start weight at [facility]: 335.6 BMI: 64.43  24 hr Dietary Recall: First Meal:  skipped Snack 9am: banana  Second Meal 11:30: sandwich or fried chicken Snack: candy Third Meal:  Snack: chips Beverages: water, unsweet tea  Encouraged to engage in 150 minutes of moderate physical activity including cardiovascular and weight baring weekly  Handouts given during visit include:  . Pre-Op Goals . Bariatric Surgery Protein Shakes During the appointment today the following Pre-Op Goals were reviewed with the patient: . Maintain or lose weight as instructed by your surgeon . Make healthy food choices . Begin to limit portion sizes . Limited concentrated sugars and fried foods . Keep fat/sugar in the single digits per serving on             food labels . Practice CHEWING your food  (aim for 30 chews per bite or until applesauce consistency) . Practice not drinking 15 minutes before, during, and 30 minutes after each meal/snack . Avoid all carbonated beverages  . Avoid/limit caffeinated beverages  . Avoid all sugar-sweetened beverages . Consume 3 meals per day; eat every 3-5 hours . Make a list of non-food related activities . Aim for 64-100 ounces of FLUID daily  . Aim for at least 60-80 grams of PROTEIN daily . Look for a liquid protein  source that contain ?15 g protein and ?5 g carbohydrate  (ex: shakes, drinks, shots)  -Follow diet recommendations listed below   Energy and Macronutrient Recomendations: Calories: 1400 Carbohydrate: 158 Protein: 105 Fat: 39  Demonstrated degree of understanding via:  Teach Back  Teaching Method Utilized:  Visual Auditory Hands on  Barriers to learning/adherence to lifestyle change: none identified  Patient to call the Nutrition and [facility] Education Services to enroll in Pre-Op and Post-Op Nutrition Education when surgery date is scheduled.

## 2018-03-26 DIAGNOSIS — L255 Unspecified contact dermatitis due to plants, except food: Secondary | ICD-10-CM | POA: Diagnosis not present

## 2018-03-29 DIAGNOSIS — L259 Unspecified contact dermatitis, unspecified cause: Secondary | ICD-10-CM | POA: Diagnosis not present

## 2018-04-23 ENCOUNTER — Encounter: Payer: Self-pay | Admitting: Skilled Nursing Facility1

## 2018-04-23 ENCOUNTER — Encounter: Payer: 59 | Attending: General Surgery | Admitting: Skilled Nursing Facility1

## 2018-04-23 DIAGNOSIS — Z713 Dietary counseling and surveillance: Secondary | ICD-10-CM | POA: Insufficient documentation

## 2018-04-23 DIAGNOSIS — E669 Obesity, unspecified: Secondary | ICD-10-CM

## 2018-04-23 DIAGNOSIS — Z6841 Body Mass Index (BMI) 40.0 and over, adult: Secondary | ICD-10-CM | POA: Diagnosis not present

## 2018-04-23 NOTE — Progress Notes (Signed)
Sleeve Assessment:   1st SWL Appointment.  6 months SWL  Pt states she has been practicing chewing well and eating without drinking, not drinking with meals has been difficult. Pt states she has been overeating foods thinking she would not be able to eat then after surgery so she is eating them now.  Pt states she has made some changes at work having them bring in fruit instead of doughnuts. Pt states she is going to start cooking healthier with her indoor grill and eating healthier To work on: eating breakfast and balanced meals and mindfull eating   Start weight at [facility]: 335.6 BMI: 64.43   MEDICATIONS: SEE LIst   DIETARY INTAKE:  24 hr Dietary Recall: First Meal:  skipped Snack 9am: banana  Second Meal 11:30: sandwich or fried chicken Snack: candy Third Meal:  Snack: chips Beverages: water, unsweet tea  Usual physical activity: ADL's  Diet to Follow: 1400 calories 158 g carbohydrates 105 g protein 39 g fat   Nutritional Diagnosis:  [place]-3.3 Overweight/obesity related to past poor dietary habits and physical inactivity as evidenced by patient w/ planned sleeve surgery following dietary guidelines for continued weight loss.    Intervention:  Nutrition counseling for upcoming Bariatric Surgery. Goals: -Encouraged to engage in 150 minutes of moderate physical activity including cardiovascular and weight baring weekly  Teaching Method Utilized:  Visual Auditory Hands on  Barriers to learning/adherence to lifestyle change: none identified   Demonstrated degree of understanding via:  Teach Back   Monitoring/Evaluation:  Dietary intake, exercise,and body weight prn.

## 2018-04-27 ENCOUNTER — Ambulatory Visit: Payer: 59 | Admitting: Skilled Nursing Facility1

## 2018-05-28 ENCOUNTER — Encounter: Payer: Self-pay | Admitting: Skilled Nursing Facility1

## 2018-05-28 ENCOUNTER — Encounter: Payer: 59 | Attending: General Surgery | Admitting: Skilled Nursing Facility1

## 2018-05-28 DIAGNOSIS — E669 Obesity, unspecified: Secondary | ICD-10-CM

## 2018-05-28 DIAGNOSIS — Z6841 Body Mass Index (BMI) 40.0 and over, adult: Secondary | ICD-10-CM | POA: Insufficient documentation

## 2018-05-28 DIAGNOSIS — Z713 Dietary counseling and surveillance: Secondary | ICD-10-CM | POA: Diagnosis present

## 2018-05-28 NOTE — Progress Notes (Signed)
Sleeve Assessment: 2nd SWL Appointment.  6 months SWL  To work on: log everything you eat and drink  Pt states she is off prednisone now. Pt states being on the prednisone caused weight gain. Pt states she has been working on chewing and not drinking meals which has been going well which has helped her slow down in eating. Pt states she has been having more good bowel movements since eating more fruit. Pt states for mindful eating she has been thinking more about what she is going to eat and pre planning.   Start weight at [facility]: 335.6 Weight: 342 BMI: 65.69  MEDICATIONS: SEE LIst   DIETARY INTAKE:  24 hr Dietary Recall: fruit during the day at work First Meal:  skipped Snack 9am: banana  Second Meal 11:30: sandwich or fried chicken or chips or hamburger from home or chicken and rice Snack: fruit Third Meal: 2 hot dog wieners and beans or hamburger and chips or fried fish  Snack: chips Beverages: water, unsweet tea, aloe vera jucie  Usual physical activity: ADL's  Diet to Follow: 1400 calories 158 g carbohydrates 105 g protein 39 g fat   Nutritional Diagnosis:  [place]-3.3 Overweight/obesity related to past poor dietary habits and physical inactivity as evidenced by patient w/ planned sleeve surgery following dietary guidelines for continued weight loss.    Intervention:  Nutrition counseling for upcoming Bariatric Surgery. Goals: -Encouraged to engage in 150 minutes of moderate physical activity including cardiovascular and weight baring weekly  Teaching Method Utilized:  Visual Auditory Hands on  Barriers to learning/adherence to lifestyle change: none identified   Demonstrated degree of understanding via:  Teach Back   Monitoring/Evaluation:  Dietary intake, exercise,and body weight prn.

## 2018-06-25 ENCOUNTER — Encounter: Payer: 59 | Attending: General Surgery | Admitting: Skilled Nursing Facility1

## 2018-06-25 ENCOUNTER — Encounter: Payer: Self-pay | Admitting: Skilled Nursing Facility1

## 2018-06-25 DIAGNOSIS — Z6841 Body Mass Index (BMI) 40.0 and over, adult: Secondary | ICD-10-CM | POA: Insufficient documentation

## 2018-06-25 DIAGNOSIS — Z713 Dietary counseling and surveillance: Secondary | ICD-10-CM | POA: Diagnosis not present

## 2018-06-25 DIAGNOSIS — E669 Obesity, unspecified: Secondary | ICD-10-CM

## 2018-06-25 NOTE — Progress Notes (Signed)
Sleeve Assessment: 3rd SWL Appointment.  6 months SWL  Pt arrives having lost about 3 pounds. Pt states she tries not to eat after 7pm. 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 Pt states she was logging her food for 2 weeks but then lost her book she bought. Pt states she slacked off after 2 weeks of logging. Pt states she want sot log her food every day. Pt states she was honest with herself in writing what she ate. Pt states her surgeon asked her to lose 25 pounds prior to surgery.   Start weight at [facility]: 335.6 Weight: 339 BMI: 65.21  MEDICATIONS: SEE LIst   DIETARY INTAKE:  24 hr Dietary Recall: fruit during the day at work First Meal:  skipped Snack 9am: banana  Second Meal 11:30: sandwich or fried chicken or chips or hamburger from home or chicken and rice or bag of fruity snacks with white chocolate peanut m and m's Snack: fruit Third Meal: 2 hot dog wieners and beans or hamburger and chips or fried fish or pork and beans  Snack: chips Beverages: water, unsweet tea, aloe vera jucie  Usual physical activity: ADL's  Diet to Follow: 1400 calories 158 g carbohydrates 105 g protein 39 g fat   Nutritional Diagnosis:  -3.3 Overweight/obesity related to past poor dietary habits and physical inactivity as evidenced by patient w/ planned sleeve surgery following dietary guidelines for continued weight loss.    Intervention:  Nutrition counseling for upcoming Bariatric Surgery. Goals: -Encouraged to engage in 150 minutes of moderate physical activity including cardiovascular and weight baring weekly -Work on not eating any fast food throughout the week aiming to include non-starchy veggies with each lunch and dinner Teaching Method Utilized:  Visual Auditory Hands on  Barriers to learning/adherence to lifestyle change: none identified   Demonstrated degree of understanding via:  Teach Back   Monitoring/Evaluation:  Dietary intake, exercise,and body weight prn.

## 2018-06-25 NOTE — Patient Instructions (Signed)
-  Work on not eating any fast food throughout the week aiming to include non-starchy veggies with each lunch and dinner

## 2018-07-27 ENCOUNTER — Encounter: Payer: Self-pay | Admitting: Skilled Nursing Facility1

## 2018-07-27 ENCOUNTER — Encounter: Payer: 59 | Attending: General Surgery | Admitting: Skilled Nursing Facility1

## 2018-07-27 DIAGNOSIS — Z713 Dietary counseling and surveillance: Secondary | ICD-10-CM | POA: Insufficient documentation

## 2018-07-27 DIAGNOSIS — Z6841 Body Mass Index (BMI) 40.0 and over, adult: Secondary | ICD-10-CM | POA: Diagnosis not present

## 2018-07-27 DIAGNOSIS — E669 Obesity, unspecified: Secondary | ICD-10-CM

## 2018-07-27 NOTE — Progress Notes (Signed)
Sleeve Assessment: 4th SWL Appointment.  6 months SWL  Pt arrives having lost about 3 pounds. Pt states she tries not to eat after 7pm. 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 Pt states she was logging her food for 2 weeks but then lost her book she bought. Pt states she slacked off after 2 weeks of logging. Pt states she want sot log her food every day. Pt states she was honest with herself in writing what she ate. Pt states her surgeon asked her to lose 25 pounds prior to surgery.   Pt arrives having maintained her weight. Pt states she had been overworked lately. Pt states she is realizing she has to say no. Pt states she has been still working on eating more vegetables every day.    Start weight at [facility]: 335.6 Weight: 340 BMI: 65.31  MEDICATIONS: SEE LIst   DIETARY INTAKE:  24 hr Dietary Recall: fruit during the day at work First Meal:  skipped Snack 9am: banana  Second Meal 11:30: sandwich or fried chicken or chips or hamburger from home or chicken and rice or bag of fruity snacks with white chocolate peanut m and m's Snack: fruit Third Meal: 2 hot dog wieners and beans or hamburger and chips or fried fish or pork and beans  Snack: chips Beverages: water, unsweet tea, aloe vera jucie  Usual physical activity: ADL's  Diet to Follow: 1400 calories 158 g carbohydrates 105 g protein 39 g fat   Nutritional Diagnosis:  [place]-3.3 Overweight/obesity related to past poor dietary habits and physical inactivity as evidenced by patient w/ planned sleeve surgery following dietary guidelines for continued weight loss.    Intervention:  Nutrition counseling for upcoming Bariatric Surgery. Goals: -Encouraged to engage in 150 minutes of moderate physical activity including cardiovascular and weight baring weekly -Use your snack ideas sheet to choose healthier snacks  -Keep working on saying no to give you time to eat and when you do eat it will involve non starchy vegetables   Teaching Method Utilized:  Visual Auditory Hands on  Barriers to learning/adherence to lifestyle change: none identified   Demonstrated degree of understanding via:  Teach Back   Monitoring/Evaluation:  Dietary intake, exercise,and body weight prn.

## 2018-07-31 DIAGNOSIS — I10 Essential (primary) hypertension: Secondary | ICD-10-CM | POA: Diagnosis not present

## 2018-08-10 ENCOUNTER — Other Ambulatory Visit (INDEPENDENT_AMBULATORY_CARE_PROVIDER_SITE_OTHER): Payer: Self-pay | Admitting: Family

## 2018-09-01 ENCOUNTER — Encounter: Payer: Self-pay | Admitting: Skilled Nursing Facility1

## 2018-09-01 ENCOUNTER — Encounter: Payer: 59 | Attending: General Surgery | Admitting: Skilled Nursing Facility1

## 2018-09-01 DIAGNOSIS — Z6841 Body Mass Index (BMI) 40.0 and over, adult: Secondary | ICD-10-CM | POA: Insufficient documentation

## 2018-09-01 DIAGNOSIS — Z713 Dietary counseling and surveillance: Secondary | ICD-10-CM | POA: Diagnosis not present

## 2018-09-01 DIAGNOSIS — E669 Obesity, unspecified: Secondary | ICD-10-CM

## 2018-09-01 NOTE — Progress Notes (Signed)
Sleeve Assessment: 5th SWL Appointment.  6 months SWL  Pt states her surgeon asked her to lose 25 pounds prior to surgery.   Pt arrives having lost about 3 pounds. Pt states she got frustrated with herself and her dietary habits and felt nothing was working so she had to get it in her head she has to eat if she wants to lose weight. Pt states she has joined [employer] which has helped her to track her food and as seen when she eats she loses weight. Pt states she has changed her snacks to be celery and carrots. Pt states she will get dried edamame. Pt states on the weekends she does better with having a more substantial breakfast. Pt states since making some better food choices she has been feeling better. Pt states she got mad at herself which resulted in her being able to make the lifestyle changes. Pt states she wants to do water aerobics but has not had the chance. 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   Start weight at [facility]: 335.6 Weight: 337 BMI: 64.73  MEDICATIONS: SEE LIst   DIETARY INTAKE:  24 hr Dietary Recall: fruit during the day at work First Meal:  Toast and egg or bacon and tater tots  Snack 9am: banana or kiwi Second Meal 11:30: sandwich or spaghetti and meatballs and broccoli  Snack: celery or carrots or apple or banana Third Meal: 2 hot dog wieners and beans or hamburger and chips or fried fish or pork and beans  Snack: chips Beverages: water, unsweet tea, aloe vera jucie  Usual physical activity: ADL's  Diet to Follow: 1400 calories 158 g carbohydrates 105 g protein 39 g fat   Nutritional Diagnosis:  [place]-3.3 Overweight/obesity related to past poor dietary habits and physical inactivity as evidenced by patient w/ planned sleeve surgery following dietary guidelines for continued weight loss.    Intervention:  Nutrition counseling for upcoming Bariatric Surgery. Goals: -Encouraged to engage in 150 minutes of moderate physical activity including cardiovascular  and weight baring weekly -Use your snack ideas sheet to choose healthier snacks  -Keep making great choices for your meals! -Keep working on saying no to give you time to eat and when you do eat it will involve non starchy vegetables  Teaching Method Utilized:  Visual Auditory Hands on  Barriers to learning/adherence to lifestyle change: none identified   Demonstrated degree of understanding via:  Teach Back   Monitoring/Evaluation:  Dietary intake, exercise,and body weight prn.

## 2018-09-07 ENCOUNTER — Telehealth: Payer: Self-pay | Admitting: Skilled Nursing Facility1

## 2018-09-07 NOTE — Telephone Encounter (Signed)
Dietitian called pt to update her on a few questions she had.  Dietitian LVM

## 2018-09-09 ENCOUNTER — Telehealth: Payer: Self-pay | Admitting: Skilled Nursing Facility1

## 2018-09-09 NOTE — Telephone Encounter (Signed)
Pt returned pts call.   Dietitian let pt know the answers the dietitian got for her questions from [place] and her surgeon.

## 2018-09-21 ENCOUNTER — Encounter: Payer: 59 | Attending: General Surgery | Admitting: Skilled Nursing Facility1

## 2018-09-21 ENCOUNTER — Encounter: Payer: Self-pay | Admitting: Skilled Nursing Facility1

## 2018-09-21 DIAGNOSIS — Z713 Dietary counseling and surveillance: Secondary | ICD-10-CM | POA: Diagnosis not present

## 2018-09-21 DIAGNOSIS — E669 Obesity, unspecified: Secondary | ICD-10-CM

## 2018-09-21 DIAGNOSIS — Z6841 Body Mass Index (BMI) 40.0 and over, adult: Secondary | ICD-10-CM | POA: Diagnosis not present

## 2018-09-21 NOTE — Progress Notes (Signed)
Sleeve Assessment: 6th SWL Appointment.  6 months SWL  Pt states her surgeon asked her to lose 25 pounds prior to surgery.   Pt is a Curator.   Pt arrives having maintained weight. Pt states she has been finding out she does better when she eats more rather than skipping meals and getting better at eating and not skipping.  Pt states she wakes up 5:30am eating her first food at 8-9am.    Start weight at [facility]: 335.6 Weight: 337 BMI: 64.73  MEDICATIONS: SEE LIst   DIETARY INTAKE:  24 hr Dietary Recall: fruit during the day at work First Meal 8-9:  Toast with butter ad jelly and bacon and tater tots  Snack 9am: banana or kiwi or bag of chips Second Meal 11:30: chicken and cabbage and cupcake  Third Meal: chicken  Snack: butter popcorn Beverages: water, diet tea  Usual physical activity: ADL's  Diet to Follow: 1400 calories 158 g carbohydrates 105 g protein 39 g fat   Nutritional Diagnosis:  [place]-3.3 Overweight/obesity related to past poor dietary habits and physical inactivity as evidenced by patient w/ planned sleeve surgery following dietary guidelines for continued weight loss.    Intervention:  Nutrition counseling for upcoming Bariatric Surgery. Goals: -Encouraged to engage in 150 minutes of moderate physical activity including cardiovascular and weight baring weekly -Do not eat any simple sugars by not buying it  -Keep working on walking more often at work -Do not just grab and eat; stop an think about what you are about to eat and why you are about to eat it Teaching Method Utilized:  Visual Auditory Hands on  Barriers to learning/adherence to lifestyle change: none identified   Demonstrated degree of understanding via:  Teach Back   Monitoring/Evaluation:  Dietary intake, exercise,and body weight prn.

## 2018-09-24 DIAGNOSIS — Z01419 Encounter for gynecological examination (general) (routine) without abnormal findings: Secondary | ICD-10-CM | POA: Diagnosis not present

## 2018-09-24 DIAGNOSIS — Z6841 Body Mass Index (BMI) 40.0 and over, adult: Secondary | ICD-10-CM | POA: Diagnosis not present

## 2018-10-19 ENCOUNTER — Encounter: Payer: BLUE CROSS/BLUE SHIELD | Attending: General Surgery | Admitting: Skilled Nursing Facility1

## 2018-10-19 ENCOUNTER — Encounter: Payer: Self-pay | Admitting: Skilled Nursing Facility1

## 2018-10-19 DIAGNOSIS — Z6841 Body Mass Index (BMI) 40.0 and over, adult: Secondary | ICD-10-CM | POA: Insufficient documentation

## 2018-10-19 DIAGNOSIS — E669 Obesity, unspecified: Secondary | ICD-10-CM

## 2018-10-19 DIAGNOSIS — Z713 Dietary counseling and surveillance: Secondary | ICD-10-CM | POA: Diagnosis not present

## 2018-10-19 NOTE — Progress Notes (Signed)
Sleeve Assessment: SWL Appointment.   Pt states her surgeon asked her to lose 25 pounds prior to surgery.   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 Pt states she is starting to see where eating more of the right foods results in weight loss. Pt states he has not been eating sweets.  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   Start weight at [facility]: 335.6 Weight: 335 BMI: 64.35  MEDICATIONS: SEE LIst   DIETARY INTAKE:  24 hr Dietary Recall: fruit during the day at work First Meal 8-9:  Toast with butter and jelly and bacon and tater tots and apple Snack 9am: banana or kiwi or bag of chips or nuts  Second Meal 11:30: chicken and cabbage and cupcake or spagetti with cheese and meat sauce Third Meal: fried fish and salad or green beans or lima beans and corn Snack: butter popcorn or apple or pecans  Beverages: water: 90 ounces, diet tea  Usual physical activity: ADL's  Diet to Follow: 1400 calories 158 g carbohydrates 105 g protein 39 g fat   Nutritional Diagnosis:  [place]-3.3 Overweight/obesity related to past poor dietary habits and physical inactivity as evidenced by patient w/ planned sleeve surgery following dietary guidelines for continued weight loss.    Intervention:  Nutrition counseling for upcoming Bariatric Surgery. Goals: -Encouraged to engage in 150 minutes of moderate physical activity including cardiovascular and weight baring weekly -Get set up with water aerobics  -Limit red meat: using ground [place] or ground chicken instead  -Have non starchy vegetables with every lunch and every dinner 7 days a week Teaching Method Utilized:  Visual Auditory Hands on  Barriers to learning/adherence to lifestyle change: none identified   Demonstrated degree of understanding via:  Teach Back   Monitoring/Evaluation:  Dietary intake, exercise,and body weight prn.

## 2018-10-19 NOTE — Patient Instructions (Addendum)
-  Get set up with water aerobics   -Limit red meat: using ground [place] or ground chicken instead   -Have non starchy vegetables with every lunch and every dinner 7 days a week

## 2018-11-09 ENCOUNTER — Telehealth (INDEPENDENT_AMBULATORY_CARE_PROVIDER_SITE_OTHER): Payer: Self-pay | Admitting: Orthopedic Surgery

## 2018-11-09 NOTE — Telephone Encounter (Signed)
I called and lm on vm to advise that we fill out the handicap parking paperwork for new post operative patients or an acute injury. We have not seen the pt in a year. If she has any questions she can call the office.

## 2018-11-09 NOTE — Telephone Encounter (Signed)
Patient called requesting an handicap sticker,  Please call patient to advise.  [phone]

## 2018-11-19 ENCOUNTER — Encounter: Payer: BLUE CROSS/BLUE SHIELD | Attending: General Surgery | Admitting: Skilled Nursing Facility1

## 2018-11-19 ENCOUNTER — Encounter: Payer: Self-pay | Admitting: Skilled Nursing Facility1

## 2018-11-19 DIAGNOSIS — Z713 Dietary counseling and surveillance: Secondary | ICD-10-CM | POA: Diagnosis not present

## 2018-11-19 DIAGNOSIS — Z6841 Body Mass Index (BMI) 40.0 and over, adult: Secondary | ICD-10-CM | POA: Diagnosis not present

## 2018-11-19 DIAGNOSIS — E669 Obesity, unspecified: Secondary | ICD-10-CM

## 2018-11-19 NOTE — Progress Notes (Signed)
Sleeve Assessment: SWL Appointment.   Pt states her surgeon asked her to lose 25 pounds prior to surgery.   Pt is a Curator.   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   Pt arrives having gained about 6 pounds. Pt states she logged her food for 2 weeks but then got side tracked. Pt states the coworker that got surgery with arthritis advised the pt not get it because of her arthritis. Pt states she has not bought any [employer] cookies. Pt states he has gotten back into water aerobics.  Pt states she knows she has to do better with eating healthier and portions.    Start weight at [facility]: 335.6 Weight: 341 BMI: 65.50  MEDICATIONS: SEE LIst   DIETARY INTAKE:  24 hr Dietary Recall: fruit during the day at work First Meal 8-9:  Toast with butter and jelly and bacon and tater tots and apple Snack 9am: banana or kiwi or bag of chips or nuts  Second Meal 11:30: chicken and cabbage and cupcake or spagetti with cheese and meat sauce Third Meal: fried fish and salad or green beans or lima beans and corn Snack: butter popcorn or apple or pecans  Beverages: water: 90 ounces, diet tea  Usual physical activity: water aerobics   Diet to Follow: 1400 calories 158 g carbohydrates 105 g protein 39 g fat   Nutritional Diagnosis:  [place]-3.3 Overweight/obesity related to past poor dietary habits and physical inactivity as evidenced by patient w/ planned sleeve surgery following dietary guidelines for continued weight loss.    Intervention:  Nutrition counseling for upcoming Bariatric Surgery. Goals: -Encouraged to engage in 150 minutes of moderate physical activity including cardiovascular and weight baring weekly -Get set up with water aerobics  -Limit red meat: using ground [place] or ground chicken instead  -Have non starchy vegetables with every lunch and every dinner 7 days a week Teaching Method Utilized:   Visual Auditory Hands on  Barriers to learning/adherence to lifestyle change: none identified   Demonstrated degree of understanding via:  Teach Back   Monitoring/Evaluation:  Dietary intake, exercise,and body weight prn.

## 2018-12-17 ENCOUNTER — Encounter: Payer: BLUE CROSS/BLUE SHIELD | Attending: General Surgery | Admitting: Skilled Nursing Facility1

## 2018-12-17 ENCOUNTER — Other Ambulatory Visit: Payer: Self-pay

## 2018-12-17 DIAGNOSIS — Z6841 Body Mass Index (BMI) 40.0 and over, adult: Secondary | ICD-10-CM | POA: Diagnosis not present

## 2018-12-17 DIAGNOSIS — E669 Obesity, unspecified: Secondary | ICD-10-CM

## 2018-12-17 DIAGNOSIS — Z713 Dietary counseling and surveillance: Secondary | ICD-10-CM | POA: Diagnosis not present

## 2018-12-17 NOTE — Progress Notes (Signed)
Sleeve Assessment: SWL Appointment.   Pt states her surgeon asked her to lose 25 pounds prior to surgery.   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 Pt states she has been frustrated with [facility] due to not contacting her so she will longer work with the dietitian.  Pt states she is currently not eating beef and will work on not eating fried foods. Pt states that currently the only fried food she eats is fried fish. Pt states she is willing to begin working on increasing her fluid intake and wants to continue to go to her water aerobics class. Pt states that she has trouble eating enough vegetables daily and often gets busy throughout the day which prevents her from eating every 3-5 hours. Pt states that she has continued to use a smaller plate to control her portion sizes. Pt states that she will try to limit desserts/goodies to once a week. Pt states that she will begin to bring non-starchy vegetables as snacks. Pt states she will try to continue to use her 32oz water bottle.  Start weight at [facility]: 335.6 Weight: 337 BMI: 64.73  MEDICATIONS: SEE List   Usual physical activity: water aerobics 2-3x week  Diet to Follow: 1400 calories 158 g carbohydrates 105 g protein 39 g fat   Nutritional Diagnosis:  [place]-3.3 Overweight/obesity related to past poor dietary habits and physical inactivity as evidenced by patient w/ planned sleeve surgery following dietary guidelines for continued weight loss.    Intervention:  Nutrition counseling for upcoming Bariatric Surgery.  Goals: -Drink a minimum of 64 fluid ounces a day -Eat non starchy vegetables at least 2 times a day 7 days a week -Choose whole grain/complex carbohydrate when you do eat energy sources such as seed cracker, whole wheat bread, brown rice, any fruit, quinoa, starchy vegetables (potato, corn, peas, beans) -Consistency is important  -Limit dessert/goodies to once a week -Stay with water aerobics 2-3 days a week  aiming for 150 minutes of activity a week (minimum 30 minutes at a time) -Eat every 3-5 hours  -Eat until satisfaction not fullness; using smaller plates is helpful   Teaching Method Utilized:  Visual Auditory Hands on  Barriers to learning/adherence to lifestyle change: busy lifestyle, motivation   Demonstrated degree of understanding via:  Teach Back   Monitoring/Evaluation:  Dietary intake, exercise,and body weight prn.

## 2018-12-17 NOTE — Patient Instructions (Addendum)
-  Drink a minimum of 64 fluid ounces a day  -Eat non starchy vegetables at least 2 times a day 7 days a week  -Choose whole grain/complex carbohydrate when you do eat energy sources such as seed cracker, whole wheat bread, brown rice, any fruit, quinoa, starchy vegetables (potato, corn, peas, beans)  -Consistency is important   -Limit dessert/goodies to once a week  -Stay with water aerobics 2-3 days a week aiming for 150 minutes of activity a week (minimum 30 minutes at a time)  -Eat every 3-5 hours   -Eat until satisfaction not fullness; using smaller plates is helpful   -Identify your barriers: what is going to stop you from eating vegetables 2 times a day?

## 2019-01-25 DIAGNOSIS — L255 Unspecified contact dermatitis due to plants, except food: Secondary | ICD-10-CM | POA: Diagnosis not present

## 2019-02-01 DIAGNOSIS — L255 Unspecified contact dermatitis due to plants, except food: Secondary | ICD-10-CM | POA: Diagnosis not present

## 2019-02-05 DIAGNOSIS — J4530 Mild persistent asthma, uncomplicated: Secondary | ICD-10-CM | POA: Diagnosis not present

## 2019-02-05 DIAGNOSIS — I10 Essential (primary) hypertension: Secondary | ICD-10-CM | POA: Diagnosis not present

## 2019-04-13 ENCOUNTER — Other Ambulatory Visit: Payer: Self-pay

## 2019-04-13 ENCOUNTER — Encounter: Payer: Self-pay | Admitting: Family

## 2019-04-13 ENCOUNTER — Ambulatory Visit: Payer: Self-pay

## 2019-04-13 ENCOUNTER — Ambulatory Visit (INDEPENDENT_AMBULATORY_CARE_PROVIDER_SITE_OTHER): Payer: BC Managed Care – PPO | Admitting: Orthopedic Surgery

## 2019-04-13 VITALS — Ht 60.5 in | Wt 337.0 lb

## 2019-04-13 DIAGNOSIS — M25571 Pain in right ankle and joints of right foot: Secondary | ICD-10-CM | POA: Diagnosis not present

## 2019-04-13 DIAGNOSIS — M79671 Pain in right foot: Secondary | ICD-10-CM

## 2019-04-15 ENCOUNTER — Encounter: Payer: Self-pay | Admitting: Orthopedic Surgery

## 2019-04-15 DIAGNOSIS — M25571 Pain in right ankle and joints of right foot: Secondary | ICD-10-CM

## 2019-04-15 MED ORDER — LIDOCAINE HCL 1 % IJ SOLN
2.0000 mL | INTRAMUSCULAR | Status: AC | PRN
  Administered 2019-04-15: 2 mL

## 2019-04-15 MED ORDER — METHYLPREDNISOLONE ACETATE 40 MG/ML IJ SUSP
40.0000 mg | INTRAMUSCULAR | Status: AC | PRN
  Administered 2019-04-15: 40 mg via INTRA_ARTICULAR

## 2019-04-15 NOTE — Progress Notes (Signed)
Office Visit Note   Patient: Sarah Dodson           Date of Birth: 1961-08-02           MRN: [id number] Visit Date: 04/13/2019              Requested by: Lavone Orn, MD [facility] [employer] [address] [place],   [address] PCP: Lavone Orn, MD  Chief Complaint  Patient presents with  . Right Ankle - Pain      HPI: Patient is a 58 year old woman who presents with 2 to 38-month history of right ankle pain she feels pain with weightbearing she had arthroscopic surgery in 2016 as well as knee replacement.  Patient feels like she has a bone spur with catching in the ankle.  Assessment & Plan: Visit Diagnoses:  1. Right foot pain   2. Pain in right ankle and joints of right foot     Plan: Right ankle was injected she tolerated this well will follow-up if symptoms recur.  Follow-Up Instructions: Return if symptoms worsen or fail to improve.   Ortho Exam  Patient is alert, oriented, no adenopathy, well-dressed, normal affect, normal respiratory effort. Examination patient has a good pulse she has decreased subtalar motion she has good ankle range of motion she is tender to palpation anteriorly over the ankle joint as well as dorsally over the talonavicular joint.  Imaging: No results found. No images are attached to the encounter.  Labs: No results found for: HGBA1C, ESRSEDRATE, CRP, LABURIC, REPTSTATUS, GRAMSTAIN, CULT, LABORGA   Lab Results  Component Value Date   ALBUMIN 3.7 08/28/2016   ALBUMIN 3.5 01/09/2015    No results found for: MG No results found for: VD25OH  No results found for: PREALBUMIN CBC EXTENDED Latest Ref Rng & Units 04/04/2017 10/12/2016 08/28/2016  WBC 4.0 - 10.5 K/uL 8.4 13.0(H) 15.4(H)  RBC 3.87 - 5.11 MIL/uL 4.61 4.78 4.63  HGB 12.0 - 15.0 g/dL 13.2 13.8 13.3  HCT 36.0 - 46.0 % 40.5 41.3 40.1  PLT 150 - 400 K/uL 334 376 465(H)     Body mass index is 64.73 kg/m.  Orders:  Orders Placed This Encounter  Procedures  . XR  Ankle 2 Views Right   No orders of the defined types were placed in this encounter.    Procedures: Medium Joint Inj: R ankle on 04/15/2019 9:43 AM Indications: pain and diagnostic evaluation Details: 22 G 1.5 in needle, anteromedial approach Medications: 2 mL lidocaine 1 %; 40 mg methylPREDNISolone acetate 40 MG/ML Outcome: tolerated well, no immediate complications Procedure, treatment alternatives, risks and benefits explained, specific risks discussed. Consent was given by the patient. Immediately prior to procedure a time out was called to verify the correct patient, procedure, equipment, support staff and site/side marked as required. Patient was prepped and draped in the usual sterile fashion.      Clinical Data: No additional findings.  ROS:  All other systems negative, except as noted in the HPI. Review of Systems  Objective: Vital Signs: Ht 5' 0.5" (1.537 m)   Wt (!) 337 lb (152.9 kg)   BMI 64.73 kg/m   Specialty Comments:  No specialty comments available.  PMFS History: Patient Active Problem List   Diagnosis Date Noted  . Spondylolisthesis, lumbar region 12/22/2017  . Total knee replacement status, right 04/16/2017  . Unilateral primary osteoarthritis, right knee 09/26/2016   Past Medical History:  Diagnosis Date  . Arthritis   . Asthma   .  Bronchitis   . Esophagitis   . GERD (gastroesophageal reflux disease)   . Hypertension   . Obesity   . PONV (postoperative nausea and vomiting)    nausea with the LEFT ankle surgery    History reviewed. No pertinent family history.  Past Surgical History:  Procedure Laterality Date  . ANKLE ARTHROSCOPY Left    left 2014/ right 2016  . ANKLE ARTHROSCOPY Right 01/11/2015   Procedure: RIGHT ANKLE ARTHROSCOPY;  Surgeon: Newt Minion, MD;  Location: [facility];  Service: Orthopedics;  Laterality: Right;  . BIOPSY BREAST Right   . HYSTEROSCOPY W/D&C N/A 09/05/2016   Procedure: DILATATION AND CURETTAGE /HYSTEROSCOPY;   Surgeon: Molli Posey, MD;  Location: [facility];  Service: Gynecology;  Laterality: N/A;  . TOTAL KNEE ARTHROPLASTY Right 04/16/2017  . TOTAL KNEE ARTHROPLASTY Right 04/16/2017   Procedure: RIGHT TOTAL KNEE ARTHROPLASTY;  Surgeon: Newt Minion, MD;  Location: [facility];  Service: Orthopedics;  Laterality: Right;   Social History   Occupational History  . Not on file  Tobacco Use  . Smoking status: Former Smoker    Packs/day: 1.00    Years: 35.00    Pack years: 35.00    Quit date: 01/08/2009    Years since quitting: 10.2  . Smokeless tobacco: Never Used  Substance and Sexual Activity  . Alcohol use: No  . Drug use: No  . Sexual activity: Not on file

## 2019-05-18 DIAGNOSIS — L255 Unspecified contact dermatitis due to plants, except food: Secondary | ICD-10-CM | POA: Diagnosis not present

## 2019-07-15 ENCOUNTER — Other Ambulatory Visit: Payer: Self-pay

## 2019-07-15 ENCOUNTER — Encounter: Payer: Self-pay | Admitting: Orthopedic Surgery

## 2019-07-15 ENCOUNTER — Ambulatory Visit (INDEPENDENT_AMBULATORY_CARE_PROVIDER_SITE_OTHER): Payer: BC Managed Care – PPO | Admitting: Orthopedic Surgery

## 2019-07-15 VITALS — Ht 60.5 in | Wt 337.0 lb

## 2019-07-15 DIAGNOSIS — M25571 Pain in right ankle and joints of right foot: Secondary | ICD-10-CM

## 2019-07-16 ENCOUNTER — Encounter: Payer: Self-pay | Admitting: Orthopedic Surgery

## 2019-07-16 DIAGNOSIS — M25571 Pain in right ankle and joints of right foot: Secondary | ICD-10-CM

## 2019-07-16 MED ORDER — METHYLPREDNISOLONE ACETATE 40 MG/ML IJ SUSP
40.0000 mg | INTRAMUSCULAR | Status: AC | PRN
  Administered 2019-07-16: 40 mg via INTRA_ARTICULAR

## 2019-07-16 MED ORDER — LIDOCAINE HCL 1 % IJ SOLN
2.0000 mL | INTRAMUSCULAR | Status: AC | PRN
  Administered 2019-07-16: 2 mL

## 2019-07-16 NOTE — Progress Notes (Signed)
Office Visit Note   Patient: Sarah Dodson           Date of Birth: [birth year]           MRN: [id number] Visit Date: 07/15/2019              Requested by: Lavone Orn, MD [facility] [address] [place],  [place] [address] PCP: Lavone Orn, MD  Chief Complaint  Patient presents with  . Right Ankle - Follow-up    04/13/2019 last Cortisone inj      HPI: Patient is a 58 year old woman who presents with recurrent impingement symptoms of her right ankle.  Patient also also complains of pronation and valgus of the hindfoot.  Patient states the previous injection did provide good interval relief.  Patient states she is taking diclofenac for her pain.  Patient feels like her ankle is rolling with her ambulation.  Patient states she is still undergoing evaluation for bariatric surgery she will now follow-up with Dr. Lucia Gaskins.  Patient is given a double extra-large sock to help with the venous stasis swelling.  Assessment & Plan: Visit Diagnoses:  1. 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  With progressive pronation and valgus of the hindfoot.  Discussed ankle bracing.  Her ankle was injected.  We will evaluate further as patient proceeds with bariatric surgery to see if she would require a subtalar and talonavicular fusion.  Follow-Up Instructions: Return if symptoms worsen or fail to improve.   Ortho Exam  Patient is alert, oriented, no adenopathy, well-dressed, normal affect, normal respiratory effort. Examination with standing patient has complete pes planus with pronation and valgus to the hindfoot with complete insufficiency of the posterior tibial tendon.  She is tender to palpation anteriorly of the ankle and has impingement laterally.  Patient's ankle was injected without complications this is providing good relief in the past she does have venous stasis swelling with pitting edema but no open ulcers  and she is given a medical compression stocking to help with the swelling.  Imaging: No results found. No images are attached to the encounter.  Labs: No results found for: HGBA1C, ESRSEDRATE, CRP, LABURIC, REPTSTATUS, GRAMSTAIN, CULT, LABORGA   Lab Results  Component Value Date   ALBUMIN 3.7 08/28/2016   ALBUMIN 3.5 01/09/2015    No results found for: MG No results found for: VD25OH  No results found for: PREALBUMIN CBC EXTENDED Latest Ref Rng & Units 04/04/2017 10/12/2016 08/28/2016  WBC 4.0 - 10.5 K/uL 8.4 13.0(H) 15.4(H)  RBC 3.87 - 5.11 MIL/uL 4.61 4.78 4.63  HGB 12.0 - 15.0 g/dL 13.2 13.8 13.3  HCT 36.0 - 46.0 % 40.5 41.3 40.1  PLT 150 - 400 K/uL 334 376 465(H)     Body mass index is 64.73 kg/m.  Orders:  No orders of the defined types were placed in this encounter.  No orders of the defined types were placed in this encounter.    Procedures: Medium Joint Inj: R ankle on 07/16/2019 2:20 PM Indications: pain and diagnostic evaluation Details: 22 G 1.5 in needle, anteromedial approach Medications: 2 mL lidocaine 1 %; 40 mg methylPREDNISolone acetate 40 MG/ML Outcome: tolerated well, no immediate complications Procedure, treatment alternatives, risks and benefits explained, specific risks discussed. Consent was given by the patient. Immediately prior to procedure a time out was called to verify the correct patient, procedure, equipment, support staff and site/side marked  as required. Patient was prepped and draped in the usual sterile fashion.      Clinical Data: No additional findings.  ROS:  All other systems negative, except as noted in the HPI. Review of Systems  Objective: Vital Signs: Ht 5' 0.5" (1.537 m)   Wt (!) 337 lb (152.9 kg)   BMI 64.73 kg/m   Specialty Comments:  No specialty comments available.  PMFS History: Patient Active Problem List   Diagnosis Date Noted  . Spondylolisthesis, lumbar region 12/22/2017  . Total knee replacement  status, right 04/16/2017  . Unilateral primary osteoarthritis, right knee 09/26/2016   Past Medical History:  Diagnosis Date  . Arthritis   . Asthma   . Bronchitis   . Esophagitis   . GERD (gastroesophageal reflux disease)   . Hypertension   . Obesity   . PONV (postoperative nausea and vomiting)    nausea with the LEFT ankle surgery    History reviewed. No pertinent family history.  Past Surgical History:  Procedure Laterality Date  . ANKLE ARTHROSCOPY Left    left 2014/ right 2016  . ANKLE ARTHROSCOPY Right 01/11/2015   Procedure: RIGHT ANKLE ARTHROSCOPY;  Surgeon: Newt Minion, MD;  Location: [facility];  Service: Orthopedics;  Laterality: Right;  . BIOPSY BREAST Right   . HYSTEROSCOPY W/D&C N/A 09/05/2016   Procedure: DILATATION AND CURETTAGE /HYSTEROSCOPY;  Surgeon: Molli Posey, MD;  Location: [facility];  Service: Gynecology;  Laterality: N/A;  . TOTAL KNEE ARTHROPLASTY Right 04/16/2017  . TOTAL KNEE ARTHROPLASTY Right 04/16/2017   Procedure: RIGHT TOTAL KNEE ARTHROPLASTY;  Surgeon: Newt Minion, MD;  Location: [facility];  Service: Orthopedics;  Laterality: Right;   Social History   Occupational History  . Not on file  Tobacco Use  . Smoking status: Former Smoker    Packs/day: 1.00    Years: 35.00    Pack years: 35.00    Quit date: 01/08/2009    Years since quitting: 10.5  . Smokeless tobacco: Never Used  Substance and Sexual Activity  . Alcohol use: No  . Drug use: No  . Sexual activity: Not on file

## 2019-07-23 DIAGNOSIS — M1710 Unilateral primary osteoarthritis, unspecified knee: Secondary | ICD-10-CM | POA: Diagnosis not present

## 2019-07-23 DIAGNOSIS — I10 Essential (primary) hypertension: Secondary | ICD-10-CM | POA: Diagnosis not present

## 2019-07-23 DIAGNOSIS — Z6841 Body Mass Index (BMI) 40.0 and over, adult: Secondary | ICD-10-CM | POA: Diagnosis not present

## 2019-07-26 ENCOUNTER — Other Ambulatory Visit: Payer: Self-pay | Admitting: Surgery

## 2019-07-26 DIAGNOSIS — K219 Gastro-esophageal reflux disease without esophagitis: Secondary | ICD-10-CM

## 2019-08-03 ENCOUNTER — Other Ambulatory Visit: Payer: Self-pay | Admitting: Surgery

## 2019-08-03 ENCOUNTER — Ambulatory Visit
Admission: RE | Admit: 2019-08-03 | Discharge: 2019-08-03 | Disposition: A | Discharge status: Home / Self Care | Payer: BC Managed Care – PPO | Source: Ambulatory Visit | Attending: Surgery | Admitting: Surgery

## 2019-08-03 DIAGNOSIS — K219 Gastro-esophageal reflux disease without esophagitis: Secondary | ICD-10-CM

## 2019-08-03 DIAGNOSIS — Z01818 Encounter for other preprocedural examination: Secondary | ICD-10-CM | POA: Diagnosis not present

## 2019-08-09 DIAGNOSIS — J4530 Mild persistent asthma, uncomplicated: Secondary | ICD-10-CM | POA: Diagnosis not present

## 2019-08-09 DIAGNOSIS — Z0000 Encounter for general adult medical examination without abnormal findings: Secondary | ICD-10-CM | POA: Diagnosis not present

## 2019-08-09 DIAGNOSIS — M19071 Primary osteoarthritis, right ankle and foot: Secondary | ICD-10-CM | POA: Diagnosis not present

## 2019-08-09 DIAGNOSIS — I10 Essential (primary) hypertension: Secondary | ICD-10-CM | POA: Diagnosis not present

## 2019-08-13 ENCOUNTER — Telehealth: Payer: Self-pay | Admitting: Orthopedic Surgery

## 2019-08-13 NOTE — Telephone Encounter (Signed)
Duda patient °

## 2019-08-13 NOTE — Telephone Encounter (Signed)
Patient left a voicemail wanting to know if she needs to pre med before her dentist appointment on November 16.  CB#[phone].  Thank you.

## 2019-08-16 NOTE — Telephone Encounter (Signed)
I called and lm on vm to advise that Dr. Sharol Given does not require premed with ABX prior to any dental procedure. But if the dentist requests this there is a protocol that he has. To call back and let us know if this is the case otherwise no premed prior to dental work.

## 2019-08-18 ENCOUNTER — Other Ambulatory Visit (INDEPENDENT_AMBULATORY_CARE_PROVIDER_SITE_OTHER): Payer: Self-pay | Admitting: Orthopedic Surgery

## 2019-10-14 DIAGNOSIS — Z6841 Body Mass Index (BMI) 40.0 and over, adult: Secondary | ICD-10-CM | POA: Diagnosis not present

## 2019-10-14 DIAGNOSIS — Z01419 Encounter for gynecological examination (general) (routine) without abnormal findings: Secondary | ICD-10-CM | POA: Diagnosis not present

## 2019-10-14 DIAGNOSIS — Z1231 Encounter for screening mammogram for malignant neoplasm of breast: Secondary | ICD-10-CM | POA: Diagnosis not present

## 2019-10-18 ENCOUNTER — Other Ambulatory Visit: Payer: Self-pay | Admitting: Obstetrics and Gynecology

## 2019-10-18 DIAGNOSIS — R928 Other abnormal and inconclusive findings on diagnostic imaging of breast: Secondary | ICD-10-CM

## 2019-10-26 ENCOUNTER — Other Ambulatory Visit: Payer: Self-pay

## 2019-10-26 ENCOUNTER — Ambulatory Visit
Admission: RE | Admit: 2019-10-26 | Discharge: 2019-10-26 | Disposition: A | Discharge status: Home / Self Care | Payer: BC Managed Care – PPO | Source: Ambulatory Visit | Attending: Obstetrics and Gynecology | Admitting: Obstetrics and Gynecology

## 2019-10-26 DIAGNOSIS — R928 Other abnormal and inconclusive findings on diagnostic imaging of breast: Secondary | ICD-10-CM | POA: Diagnosis not present

## 2019-10-26 DIAGNOSIS — N6489 Other specified disorders of breast: Secondary | ICD-10-CM | POA: Diagnosis not present

## 2019-10-28 DIAGNOSIS — M1710 Unilateral primary osteoarthritis, unspecified knee: Secondary | ICD-10-CM | POA: Diagnosis not present

## 2019-10-28 DIAGNOSIS — Z6841 Body Mass Index (BMI) 40.0 and over, adult: Secondary | ICD-10-CM | POA: Diagnosis not present

## 2019-10-28 DIAGNOSIS — I10 Essential (primary) hypertension: Secondary | ICD-10-CM | POA: Diagnosis not present

## 2020-01-05 DIAGNOSIS — I10 Essential (primary) hypertension: Secondary | ICD-10-CM | POA: Diagnosis not present

## 2020-01-05 DIAGNOSIS — Z6841 Body Mass Index (BMI) 40.0 and over, adult: Secondary | ICD-10-CM | POA: Diagnosis not present

## 2020-01-05 DIAGNOSIS — M1710 Unilateral primary osteoarthritis, unspecified knee: Secondary | ICD-10-CM | POA: Diagnosis not present

## 2020-03-03 DIAGNOSIS — I10 Essential (primary) hypertension: Secondary | ICD-10-CM | POA: Diagnosis not present

## 2020-03-03 DIAGNOSIS — J4530 Mild persistent asthma, uncomplicated: Secondary | ICD-10-CM | POA: Diagnosis not present

## 2020-03-21 ENCOUNTER — Encounter: Payer: Self-pay | Admitting: Orthopedic Surgery

## 2020-03-21 ENCOUNTER — Other Ambulatory Visit: Payer: Self-pay

## 2020-03-21 ENCOUNTER — Ambulatory Visit: Payer: BC Managed Care – PPO | Admitting: Physician Assistant

## 2020-03-21 VITALS — Ht 60.5 in | Wt 337.0 lb

## 2020-03-21 DIAGNOSIS — M25571 Pain in right ankle and joints of right foot: Secondary | ICD-10-CM | POA: Diagnosis not present

## 2020-03-21 MED ORDER — LIDOCAINE HCL 1 % IJ SOLN
2.0000 mL | INTRAMUSCULAR | Status: AC | PRN
  Administered 2020-03-21: 2 mL

## 2020-03-21 MED ORDER — METHYLPREDNISOLONE ACETATE 40 MG/ML IJ SUSP
40.0000 mg | INTRAMUSCULAR | Status: AC | PRN
  Administered 2020-03-21: 40 mg via INTRA_ARTICULAR

## 2020-03-21 NOTE — Progress Notes (Signed)
Office Visit Note   Patient: Sarah Dodson           Date of Birth: 16-Nov-1960           MRN: [id number] Visit Date: 03/21/2020              Requested by: Lavone Orn, MD [address] [place],  [place] [address] PCP: Lavone Orn, MD  Chief Complaint  Patient presents with  . Right Ankle - Follow-up    Cortisone injection- right ankle last inj 07/2019      HPI: This is a pleasant 59 year old woman with a history of right ankle impingement status post ankle arthroscopy she has done quite well with steroid injections into her right ankle.  Her last injection was 8 months ago and she recently had the return of pain.  She is requesting another injection  Assessment & Plan: Visit Diagnoses: No diagnosis found.  Plan: Patient will follow up as needed  Follow-Up Instructions: No follow-ups on file.   Ortho Exam  Patient is alert, oriented, no adenopathy, well-dressed, normal affect, normal respiratory effort. Focused examination of her right ankle tender around the joint line.  No cellulitis distal pulses are intact.  She does have some planus collapse previous arthroscopy portals  Imaging: No results found. No images are attached to the encounter.  Labs: No results found for: HGBA1C, ESRSEDRATE, CRP, LABURIC, REPTSTATUS, GRAMSTAIN, CULT, LABORGA   Lab Results  Component Value Date   ALBUMIN 3.7 08/28/2016   ALBUMIN 3.5 01/09/2015    No results found for: MG No results found for: VD25OH  No results found for: PREALBUMIN CBC EXTENDED Latest Ref Rng & Units 04/04/2017 10/12/2016 08/28/2016  WBC 4.0 - 10.5 K/uL 8.4 13.0(H) 15.4(H)  RBC 3.87 - 5.11 MIL/uL 4.61 4.78 4.63  HGB 12.0 - 15.0 g/dL 13.2 13.8 13.3  HCT 36 - 46 % 40.5 41.3 40.1  PLT 150 - 400 K/uL 334 376 465(H)     Body mass index is 64.73 kg/m.  Orders:  No orders of the defined types were placed in this encounter.  No orders of the defined types were placed in this  encounter.    Procedures: Medium Joint Inj on 03/21/2020 1:04 PM Indications: pain and diagnostic evaluation Details: 22 G 1.5 in needle, anteromedial approach Medications: 2 mL lidocaine 1 %; 40 mg methylPREDNISolone acetate 40 MG/ML Outcome: tolerated well, no immediate complications Procedure, treatment alternatives, risks and benefits explained, specific risks discussed. Consent was given by the patient. Immediately prior to procedure a time out was called to verify the correct patient, procedure, equipment, support staff and site/side marked as required. Patient was prepped and draped in the usual sterile fashion.      Clinical Data: No additional findings.  ROS:  All other systems negative, except as noted in the HPI. Review of Systems  Objective: Vital Signs: Ht 5' 0.5" (1.537 m)   Wt (!) 337 lb (152.9 kg)   BMI 64.73 kg/m   Specialty Comments:  No specialty comments available.  PMFS History: Patient Active Problem List   Diagnosis Date Noted  . Spondylolisthesis, lumbar region 12/22/2017  . Total knee replacement status, right 04/16/2017  . Unilateral primary osteoarthritis, right knee 09/26/2016   Past Medical History:  Diagnosis Date  . Arthritis   . Asthma   . Bronchitis   . Esophagitis   . GERD (gastroesophageal reflux disease)   . Hypertension   . Obesity   . PONV (postoperative  nausea and vomiting)    nausea with the LEFT ankle surgery    No family history on file.  Past Surgical History:  Procedure Laterality Date  . ANKLE ARTHROSCOPY Left    left 2014/ right 2016  . ANKLE ARTHROSCOPY Right 01/11/2015   Procedure: RIGHT ANKLE ARTHROSCOPY;  Surgeon: Newt Minion, MD;  Location: [facility];  Service: Orthopedics;  Laterality: Right;  . BIOPSY BREAST Right   . HYSTEROSCOPY WITH D & C N/A 09/05/2016   Procedure: DILATATION AND CURETTAGE /HYSTEROSCOPY;  Surgeon: Molli Posey, MD;  Location: [facility];  Service: Gynecology;  Laterality: N/A;  . TOTAL KNEE  ARTHROPLASTY Right 04/16/2017  . TOTAL KNEE ARTHROPLASTY Right 04/16/2017   Procedure: RIGHT TOTAL KNEE ARTHROPLASTY;  Surgeon: Newt Minion, MD;  Location: [facility];  Service: Orthopedics;  Laterality: Right;   Social History   Occupational History  . Not on file  Tobacco Use  . Smoking status: Former Smoker    Packs/day: 1.00    Years: 35.00    Pack years: 35.00    Quit date: 01/08/2009    Years since quitting: 11.2  . Smokeless tobacco: Never Used  Vaping Use  . Vaping Use: Never used  Substance and Sexual Activity  . Alcohol use: No  . Drug use: No  . Sexual activity: Not on file

## 2020-04-05 DIAGNOSIS — M1710 Unilateral primary osteoarthritis, unspecified knee: Secondary | ICD-10-CM | POA: Diagnosis not present

## 2020-04-05 DIAGNOSIS — Z6841 Body Mass Index (BMI) 40.0 and over, adult: Secondary | ICD-10-CM | POA: Diagnosis not present

## 2020-04-05 DIAGNOSIS — I10 Essential (primary) hypertension: Secondary | ICD-10-CM | POA: Diagnosis not present

## 2020-05-12 DIAGNOSIS — F509 Eating disorder, unspecified: Secondary | ICD-10-CM | POA: Diagnosis not present

## 2020-05-15 ENCOUNTER — Encounter: Payer: BC Managed Care – PPO | Attending: Surgery | Admitting: Skilled Nursing Facility1

## 2020-05-15 ENCOUNTER — Encounter: Payer: Self-pay | Admitting: Skilled Nursing Facility1

## 2020-05-15 ENCOUNTER — Other Ambulatory Visit: Payer: Self-pay

## 2020-05-15 DIAGNOSIS — E669 Obesity, unspecified: Secondary | ICD-10-CM | POA: Diagnosis not present

## 2020-05-15 NOTE — Progress Notes (Signed)
Nutrition Assessment for Bariatric Surgery Medical Nutrition Therapy  Patient was seen on 05/15/2020 for Pre-Operative Nutrition Assessment. Letter of approval faxed to [facility] bariatric surgery program coordinator on 05/15/2020  Referral stated Supervised Weight Loss (SWL) visits needed: 0  Planned surgery: sleeve gastrectomy  Pt expectation of surgery:  Pt expectation of dietitian:     NUTRITION ASSESSMENT   Anthropometrics  Start weight at [facility]: 335.6 lbs (date: 05/28/2018)  Weight today: 327 Height: 60.5 in BMI: 62.81 kg/m2     Clinical  Medical hx:  Medications:  Labs:  Notable signs/symptoms: ankle arthritis  Any previous deficiencies? No  Micronutrient Nutrition Focused Physical Exam: Hair: No issues observed Eyes: No issues observed Mouth: No issues observed Neck: No issues observed Nails: No issues observed Skin: No issues observed  Lifestyle & Dietary Hx  Pt states she plans to continue to see her dietitian elsewhere.  Pt states Gaylan Gerold MS, RD has communicated with her dietitian.  Pt states she is trying to do better with vegetables.  Pt states she has been doing her water aerobics.   24-Hr Dietary Recall First Meal: protein shake Snack:  Second Meal: lunch meat Snack: banana + crackers Third Meal: chicken, beef or pork or fried + cabbage or greens Snack:  Beverages: water, unsweet tea   Estimated Energy Needs Calories: 1500  NUTRITION DIAGNOSIS  Overweight/obesity (Rake-3.3) related to past poor dietary habits and physical inactivity as evidenced by patient w/ planned sleeve gastrectomy surgery following dietary guidelines for continued weight loss.    NUTRITION INTERVENTION  Nutrition counseling (C-1) and education (E-2) to facilitate bariatric surgery goals.   Pre-Op Goals Reviewed with the Patient . Track food and beverage intake (pen and paper, MyFitness Pal, Baritastic app, etc.) . Make healthy food choices while  monitoring portion sizes . Consume 3 meals per day or try to eat every 3-5 hours . Avoid concentrated sugars and fried foods . Keep sugar & fat in the single digits per serving on food labels . Practice CHEWING your food (aim for applesauce consistency) . Practice not drinking 15 minutes before, during, and 30 minutes after each meal and snack . Avoid all carbonated beverages (ex: soda, sparkling beverages)  . Limit caffeinated beverages (ex: coffee, tea, energy drinks) . Avoid all sugar-sweetened beverages (ex: regular soda, sports drinks)  . Avoid alcohol  . Aim for 64-100 ounces of FLUID daily (with at least half of fluid intake being plain water)  . Aim for at least 60-80 grams of PROTEIN daily . Look for a liquid protein source that contains ?15 g protein and ?5 g carbohydrate (ex: shakes, drinks, shots) . Make a list of non-food related activities . Physical activity is an important part of a healthy lifestyle so keep it moving! The goal is to reach 150 minutes of exercise per week, including cardiovascular and weight baring activity.  *Goals that are bolded indicate the pt would like to start working towards these  Handouts Provided Include  . Bariatric Surgery handouts (Nutrition Visits, Pre-Op Goals, Protein Shakes, Vitamins & Minerals)  Learning Style & Readiness for Change Teaching method utilized: Visual & Auditory  Demonstrated degree of understanding via: Teach Back  Barriers to learning/adherence to lifestyle change: none stated     MONITORING & EVALUATION Dietary intake, weekly physical activity, body weight, and pre-op goals reached at next nutrition visit.    Next Steps  Patient is to follow up at [facility] for Pre-Op Class >2 weeks before surgery for further  nutrition education.

## 2020-05-24 DIAGNOSIS — I10 Essential (primary) hypertension: Secondary | ICD-10-CM | POA: Diagnosis not present

## 2020-05-25 ENCOUNTER — Other Ambulatory Visit: Payer: Self-pay | Admitting: Surgery

## 2020-05-25 ENCOUNTER — Other Ambulatory Visit (HOSPITAL_COMMUNITY): Payer: Self-pay | Admitting: Surgery

## 2020-05-26 ENCOUNTER — Other Ambulatory Visit (HOSPITAL_COMMUNITY): Payer: Self-pay | Admitting: Surgery

## 2020-05-26 DIAGNOSIS — Z9884 Bariatric surgery status: Secondary | ICD-10-CM

## 2020-05-29 ENCOUNTER — Other Ambulatory Visit: Payer: Self-pay

## 2020-05-29 ENCOUNTER — Ambulatory Visit (HOSPITAL_COMMUNITY)
Admission: RE | Admit: 2020-05-29 | Discharge: 2020-05-29 | Disposition: A | Discharge status: Home / Self Care | Payer: BC Managed Care – PPO | Source: Ambulatory Visit | Attending: Internal Medicine | Admitting: Internal Medicine

## 2020-05-29 ENCOUNTER — Other Ambulatory Visit (HOSPITAL_COMMUNITY): Payer: BC Managed Care – PPO

## 2020-05-29 ENCOUNTER — Ambulatory Visit (HOSPITAL_COMMUNITY)
Admission: RE | Admit: 2020-05-29 | Discharge: 2020-05-29 | Disposition: A | Discharge status: Home / Self Care | Payer: BC Managed Care – PPO | Source: Ambulatory Visit | Attending: Surgery | Admitting: Surgery

## 2020-05-29 DIAGNOSIS — Z01818 Encounter for other preprocedural examination: Secondary | ICD-10-CM | POA: Diagnosis not present

## 2020-06-13 IMAGING — RF DG UGI W SINGLE CM
12 of 14 series · 14 of 24 positions shown · non-contrast
Comparison: None.

CLINICAL DATA: Preoperative evaluation for bariatric surgery. Acid
reflux.

EXAM:
UPPER GI SERIES WITH KUB
TECHNIQUE: After obtaining a scout radiograph a routine upper GI series was
performed using thin and high density barium.
FLUOROSCOPY TIME:  Fluoroscopy Time:  2 minutes and 48 seconds.
Radiation Exposure Index (if provided by the fluoroscopic device):
579 mGy
Number of Acquired Spot Images:

[Series 1: one shot · 0.14mm/px · 1 of 1 slices shown (1 of 2)]
[im 1/1]
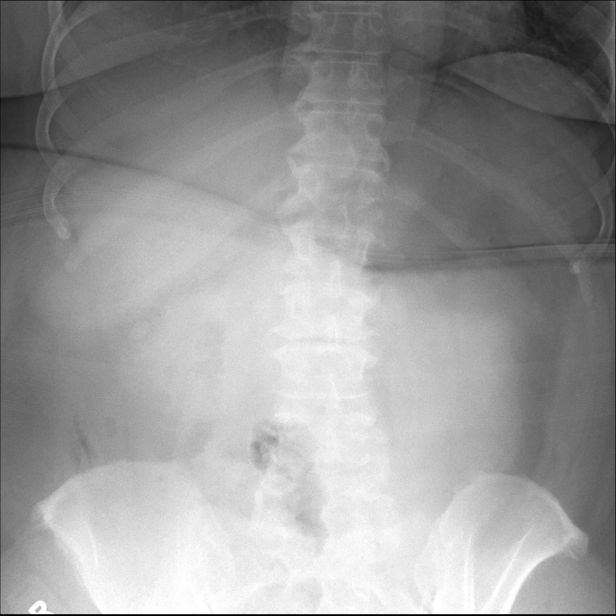

[Series 2: sequence · 0.29mm/px · 1 of 10 frames shown (1 of 10)]
[frame 9/10]
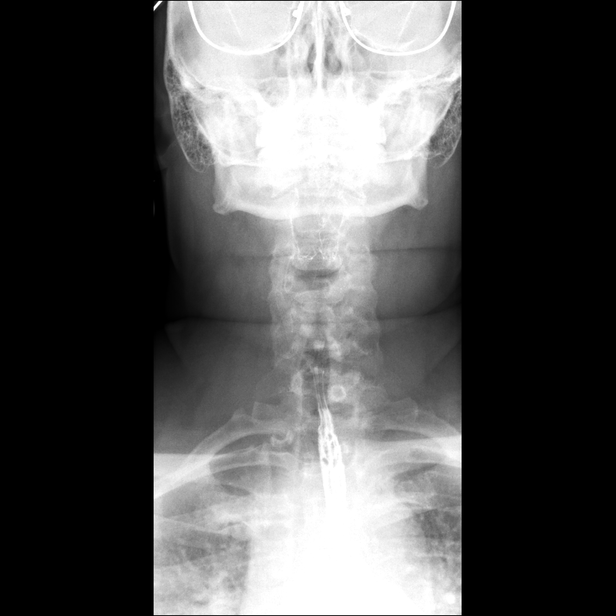

[Series 3: sequence · 0.29mm/px · 1 of 8 frames shown (2 of 10)]
[frame 5/8]
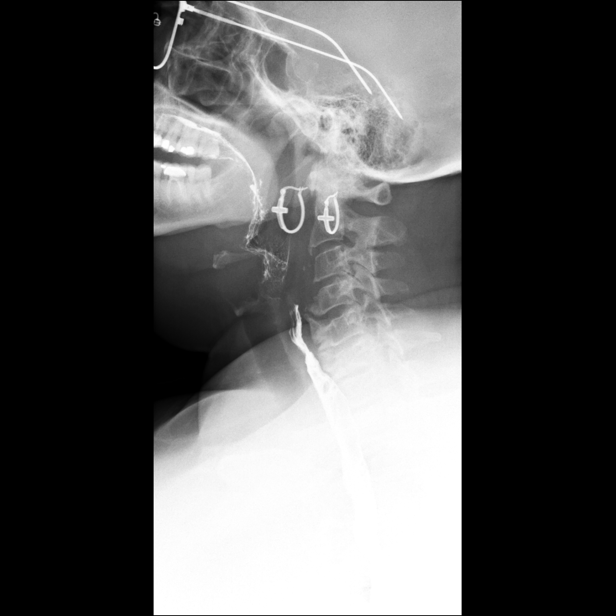

[Series 4: sequence · 0.29mm/px · 2 of 17 frames shown (3 of 10)]
[frame 3/17]
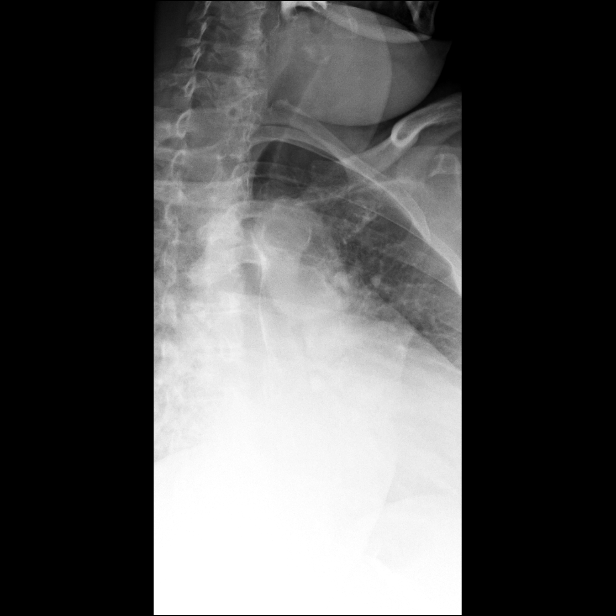
[frame 15/17]
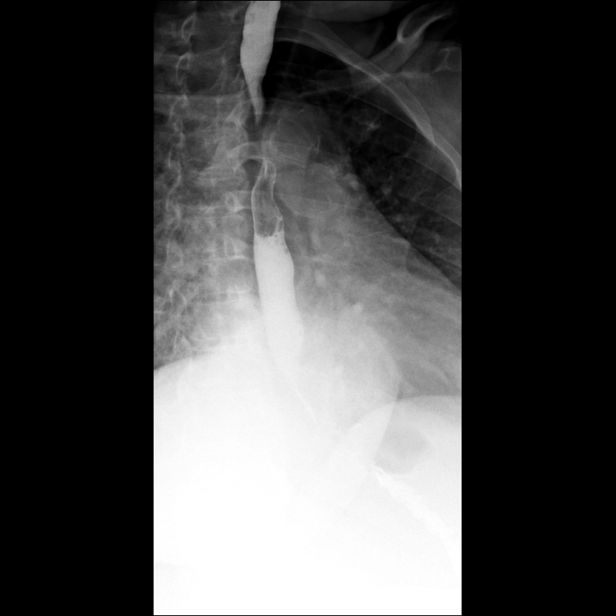

[Series 5: sequence · 0.29mm/px · 1 of 5 frames shown (4 of 10)]
[frame 3/5]
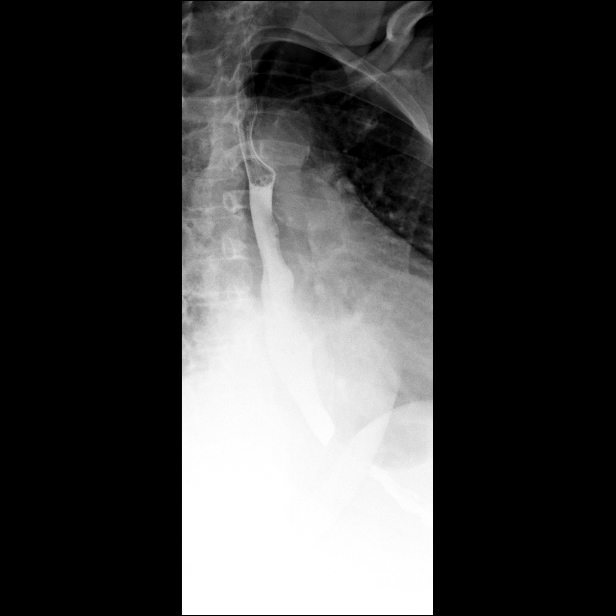

[Series 6: sequence · 0.29mm/px · 1 of 1 slices shown (5 of 10)]
[im 1/1]
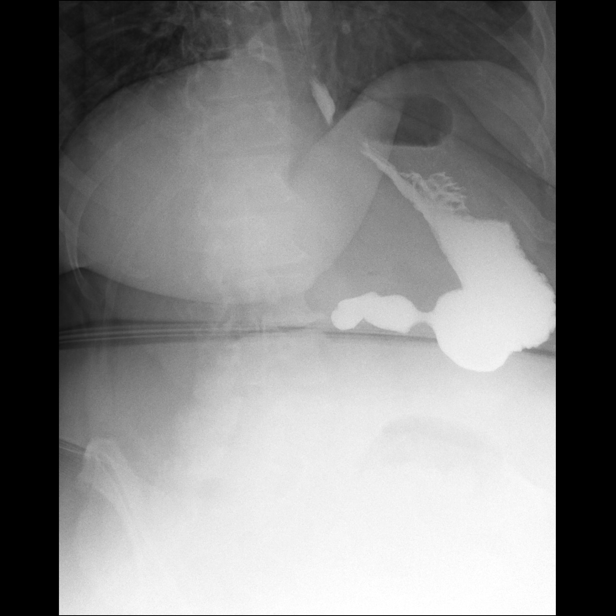

[Series 8: sequence · 0.29mm/px · 1 of 2 frames shown (6 of 10)]
[frame 1/2]
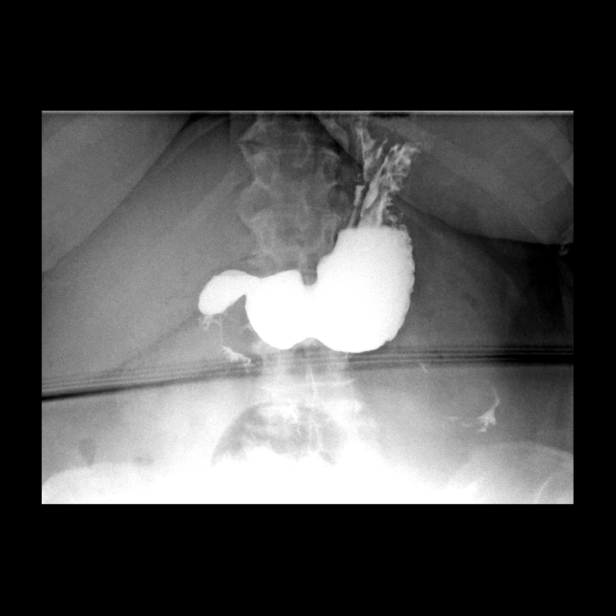

[Series 9: sequence · 0.29mm/px · 1 of 1 slices shown (7 of 10)]
[im 1/1]
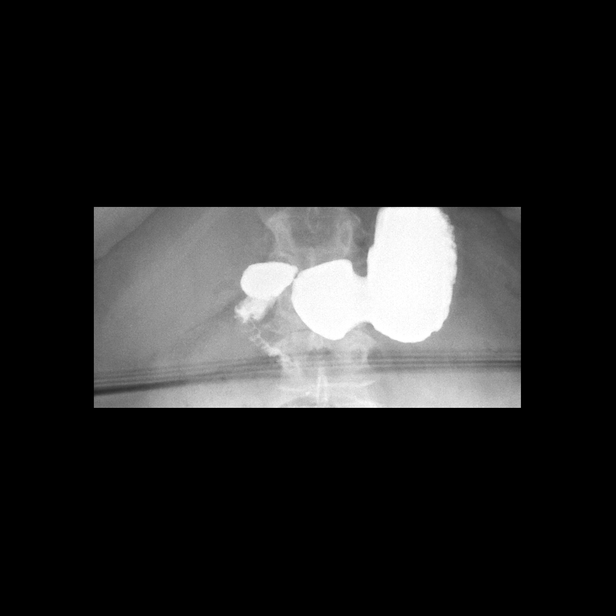

[Series 12: sequence · 0.29mm/px · 1 of 1 slices shown (8 of 10)]
[im 1/1]
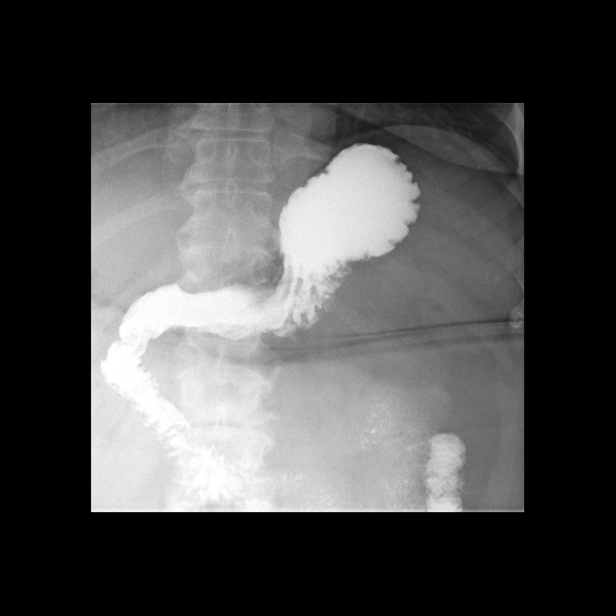

[Series 13: sequence · 2 of 41 frames shown (9 of 10)]
[frame 29/41]
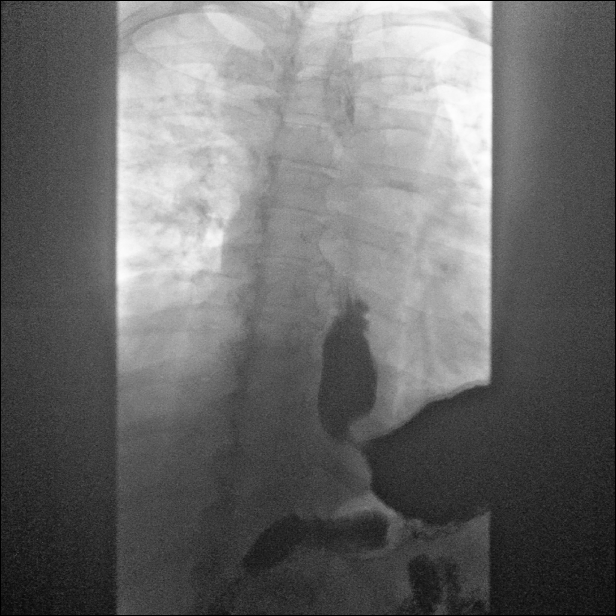
[frame 35/41]
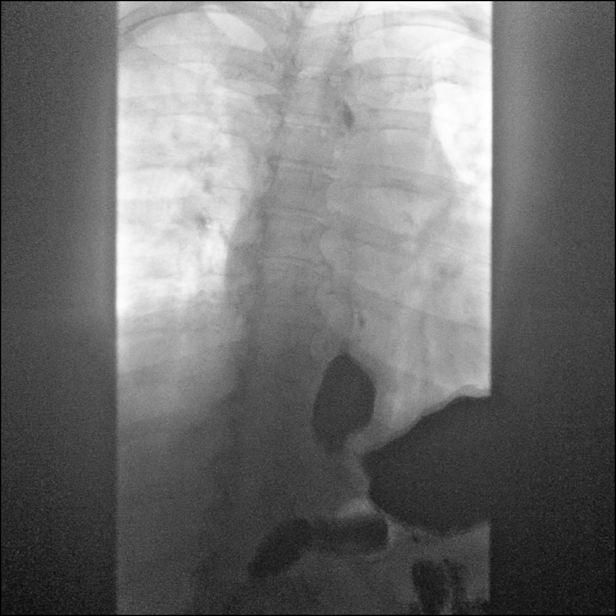

[Series 14: sequence · 1 of 55 frames shown (10 of 10)]
[frame 28/55]
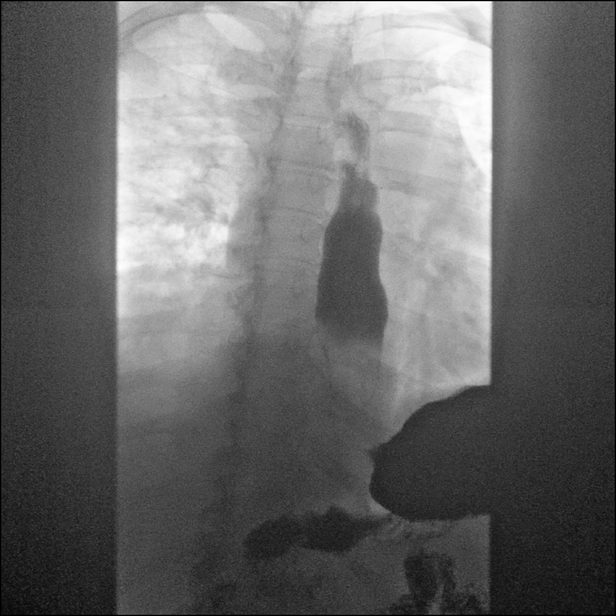

[Series 16: one shot · 1 of 1 slices shown (2 of 2)]
[im 1/1]
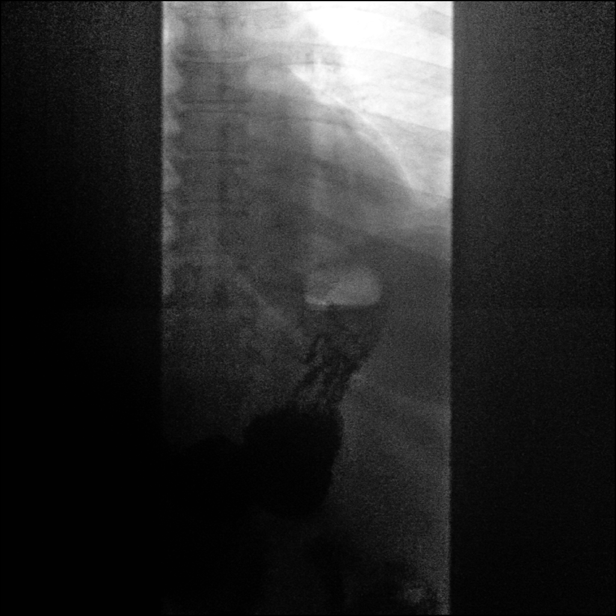

[14 of 24 positions shown; findings below may reference images not displayed]

FINDINGS: Study limited by patient body habitus and table weight limitations.

Pre-procedure KUB shows a normal bowel gas pattern.

Frontal and lateral views of the hypopharynx while swallowing are
unremarkable. Evaluation of esophageal motility shows good
preservation of primary peristalsis without tertiary contractions or
presbyesophagus. 13 mm barium tablet passes readily into the stomach
when taken with water.

Limited single contrast imaging of the stomach shows normal size and
position without evidence of hiatal hernia. Gastric emptying is
prompt. The pylorus is normal. Single contrast imaging of the
duodenal bulb is normal. Duodenum and ligament of Treitz are
normally positioned.
IMPRESSION: 1. Limited study due to body habitus and table weight limitations.
Within this limitation, this single contrast upper GI series is
normal.

## 2020-06-16 DIAGNOSIS — Z23 Encounter for immunization: Secondary | ICD-10-CM | POA: Diagnosis not present

## 2020-07-03 ENCOUNTER — Encounter: Payer: BC Managed Care – PPO | Attending: Surgery | Admitting: Skilled Nursing Facility1

## 2020-07-03 ENCOUNTER — Other Ambulatory Visit: Payer: Self-pay

## 2020-07-03 DIAGNOSIS — E669 Obesity, unspecified: Secondary | ICD-10-CM | POA: Insufficient documentation

## 2020-07-03 NOTE — Progress Notes (Signed)
Pre-Operative Nutrition Class:  Appt start time: 6803   End time:  1830.  Patient was seen on 07/03/2020 for Pre-Operative Bariatric Surgery Education at [facility]    Surgery date: 07/24/2020 Surgery type: Sleeve Gastrectomy  Start weight at [facility]: 335.6 (07/27/2018) Weight today: 321.3  Body Composition Scale 07/03/2020  Current Body Weight 321.3  Total Body Fat % 52.7  Visceral Fat 29  Fat-Free Mass % 47.2   Total Body Water % 38.1  Muscle-Mass lbs 29  BMI 58.7  Body Fat Displacement          Torso  lbs 105.2         Left Leg  lbs 21         Right Leg  lbs 21         Left Arm  lbs 10.5         Right Arm   lbs 10.5    Samples given per MNT protocol. Patient educated on appropriate usage: Bariatric opurity Multivitamin Lot # [id number] Exp: 10/2021  Bariatric fusion Calcium  Lot # [id number] Exp: 07/30/2020   Protein 20  Shake Lot # [id number] Exp: 08/19/21  The following the learning objectives were met by the patient during this course:  Identify Pre-Op Dietary Goals and will begin 2 weeks pre-operatively  Identify appropriate sources of fluids and proteins   State protein recommendations and appropriate sources pre and post-operatively  Identify Post-Operative Dietary Goals and will follow for 2 weeks post-operatively  Identify appropriate multivitamin and calcium sources  Describe the need for physical activity post-operatively and will follow MD recommendations  State when to call healthcare provider regarding medication questions or post-operative complications  Handouts given during class include:  Pre-Op Bariatric Surgery Diet Handout  Protein Shake Handout  Post-Op Bariatric Surgery Nutrition Handout  [facility] Program Information Flyer  Support Group Information Flyer  [facility] Bariatric Supplements Price List  Follow-Up Plan: Patient will follow-up at [facility] 2 weeks post operatively for diet  advancement per MD.

## 2020-07-06 ENCOUNTER — Other Ambulatory Visit: Payer: Self-pay | Admitting: Surgery

## 2020-07-06 DIAGNOSIS — I10 Essential (primary) hypertension: Secondary | ICD-10-CM | POA: Diagnosis not present

## 2020-07-06 DIAGNOSIS — Z6841 Body Mass Index (BMI) 40.0 and over, adult: Secondary | ICD-10-CM | POA: Diagnosis not present

## 2020-07-06 DIAGNOSIS — M1710 Unilateral primary osteoarthritis, unspecified knee: Secondary | ICD-10-CM | POA: Diagnosis not present

## 2020-07-18 ENCOUNTER — Encounter (HOSPITAL_COMMUNITY)
Admission: RE | Admit: 2020-07-18 | Discharge: 2020-07-18 | Disposition: A | Discharge status: Home / Self Care | Payer: BC Managed Care – PPO | Source: Ambulatory Visit | Attending: Surgery | Admitting: Surgery

## 2020-07-18 ENCOUNTER — Other Ambulatory Visit: Payer: Self-pay

## 2020-07-18 ENCOUNTER — Encounter (HOSPITAL_COMMUNITY): Payer: Self-pay

## 2020-07-18 DIAGNOSIS — Z01818 Encounter for other preprocedural examination: Secondary | ICD-10-CM | POA: Insufficient documentation

## 2020-07-18 LAB — CBC WITH DIFFERENTIAL/PLATELET
Abs Immature Granulocytes: 0.04 10*3/uL (ref 0.00–0.07)
Basophils Absolute: 0 10*3/uL (ref 0.0–0.1)
Basophils Relative: 0 %
Eosinophils Absolute: 0.3 10*3/uL (ref 0.0–0.5)
Eosinophils Relative: 2 %
HCT: 42.7 % (ref 36.0–46.0)
Hemoglobin: 13.7 g/dL (ref 12.0–15.0)
Immature Granulocytes: 0 %
Lymphocytes Relative: 23 %
Lymphs Abs: 2.5 10*3/uL (ref 0.7–4.0)
MCH: 27.8 pg (ref 26.0–34.0)
MCHC: 32.1 g/dL (ref 30.0–36.0)
MCV: 86.8 fL (ref 80.0–100.0)
Monocytes Absolute: 0.8 10*3/uL (ref 0.1–1.0)
Monocytes Relative: 7 %
Neutro Abs: 7.1 10*3/uL (ref 1.7–7.7)
Neutrophils Relative %: 68 %
Platelets: 418 10*3/uL — ABNORMAL HIGH (ref 150–400)
RBC: 4.92 MIL/uL (ref 3.87–5.11)
RDW: 13.7 % (ref 11.5–15.5)
WBC: 10.6 10*3/uL — ABNORMAL HIGH (ref 4.0–10.5)
nRBC: 0 % (ref 0.0–0.2)

## 2020-07-18 LAB — COMPREHENSIVE METABOLIC PANEL
ALT: 28 U/L (ref 0–44)
AST: 25 U/L (ref 15–41)
Albumin: 4.1 g/dL (ref 3.5–5.0)
Alkaline Phosphatase: 69 U/L (ref 38–126)
Anion gap: 13 (ref 5–15)
BUN: 22 mg/dL — ABNORMAL HIGH (ref 6–20)
CO2: 26 mmol/L (ref 22–32)
Calcium: 9.7 mg/dL (ref 8.9–10.3)
Chloride: 99 mmol/L (ref 98–111)
Creatinine, Ser: 0.87 mg/dL (ref 0.44–1.00)
GFR, Estimated: >60 mL/min (ref >60)
Glucose, Bld: 84 mg/dL (ref 70–99)
Potassium: 3.4 mmol/L — ABNORMAL LOW (ref 3.5–5.1)
Sodium: 138 mmol/L (ref 135–145)
Total Bilirubin: 0.7 mg/dL (ref 0.3–1.2)
Total Protein: 7.8 g/dL (ref 6.5–8.1)

## 2020-07-18 NOTE — Progress Notes (Signed)
COVID Vaccine Completed: Yes Date COVID Vaccine completed: 01/22/20 COVID vaccine manufacturer: Pfizer      PCP - Dr. Lavone Orn Cardiologist -   Chest x-ray -  EKG - 05/29/20 EPIC Stress Test -  ECHO -  Cardiac Cath -  Pacemaker/ICD device last checked:  Sleep Study -  CPAP -   Fasting Blood Sugar -  Checks Blood Sugar _____ times a day  Blood Thinner Instructions: Aspirin Instructions: Last Dose:  Anesthesia review:   Patient denies shortness of breath, fever, cough and chest pain at PAT appointment   Patient verbalized understanding of instructions that were given to them at the PAT appointment. Patient was also instructed that they will need to review over the PAT instructions again at home before surgery.

## 2020-07-18 NOTE — Patient Instructions (Addendum)
DUE TO COVID-19 ONLY ONE VISITOR IS ALLOWED TO COME WITH YOU AND STAY IN THE WAITING ROOM ONLY DURING PRE OP AND PROCEDURE DAY OF SURGERY. THE 1 VISITOR  MAY VISIT WITH YOU AFTER SURGERY IN YOUR PRIVATE ROOM DURING VISITING HOURS ONLY!  YOU NEED TO HAVE A COVID 19 TEST ON: 07/20/20 @ 2:40 PM , THIS TEST MUST BE DONE BEFORE SURGERY,  COVID TESTING SITE [place] [address], IT IS ON THE RIGHT GOING OUT [address] APPROXIMATELY  2 MINUTES PAST [employer] ON THE RIGHT. ONCE YOUR COVID TEST IS COMPLETED,  PLEASE BEGIN THE QUARANTINE INSTRUCTIONS AS OUTLINED IN YOUR HANDOUT.                Sheela Stack    Your procedure is scheduled on: 07/24/20   Report to [facility] Main  Entrance   Report to admitting at : 8:00 AM     Call this number if you have problems the morning of surgery [phone]    Remember:   MORNING OF SURGERY DRINK:   DRINK 1 G2 drink BEFORE YOU LEAVE HOME, DRINK ALL OF THE  G2 DRINK AT ONE TIME.   NO SOLID FOOD AFTER 600 PM THE NIGHT BEFORE YOUR SURGERY. YOU MAY DRINK CLEAR FLUIDS. THE G2 DRINK YOU DRINK BEFORE YOU LEAVE HOME WILL BE THE LAST FLUIDS YOU DRINK BEFORE SURGERY.DRINK GATORADE AT: 7:00 AM.  PAIN IS EXPECTED AFTER SURGERY AND WILL NOT BE COMPLETELY ELIMINATED. AMBULATION AND TYLENOL WILL HELP REDUCE INCISIONAL AND GAS PAIN. MOVEMENT IS KEY!  YOU ARE EXPECTED TO BE OUT OF BED WITHIN 4 HOURS OF ADMISSION TO YOUR PATIENT ROOM.  SITTING IN THE RECLINER THROUGHOUT THE DAY IS IMPORTANT FOR DRINKING FLUIDS AND MOVING GAS THROUGHOUT THE GI TRACT.  COMPRESSION STOCKINGS SHOULD BE WORN [place] UNLESS YOU ARE WALKING.   INCENTIVE SPIROMETER SHOULD BE USED EVERY HOUR WHILE AWAKE TO DECREASE POST-OPERATIVE COMPLICATIONS SUCH AS PNEUMONIA.  WHEN DISCHARGED HOME, IT IS IMPORTANT TO CONTINUE TO WALK EVERY HOUR AND USE THE INCENTIVE SPIROMETER EVERY HOUR.    CLEAR LIQUID DIET   Foods Allowed                                                                      Foods Excluded  Coffee and tea, regular and decaf                             liquids that you cannot  Plain Jell-O any favor except red or purple                                           see through such as: Fruit ices (not with fruit pulp)                                     milk, soups, orange juice  Iced Popsicles  All solid food Carbonated beverages, regular and diet                                    Cranberry, grape and apple juices Sports drinks like Gatorade Lightly seasoned clear broth or consume(fat free) Sugar, honey syrup  Sample Menu Breakfast                                Lunch                                     Supper Cranberry juice                    Beef broth                            Chicken broth Jell-O                                     Grape juice                           Apple juice Coffee or tea                        Jell-O                                      Popsicle                                                Coffee or tea                        Coffee or tea  _____________________________________________________________________   BRUSH YOUR TEETH MORNING OF SURGERY AND RINSE YOUR MOUTH OUT, NO CHEWING GUM CANDY OR MINTS.    Use inhaler and Flonase as usual                                You may not have any metal on your body including hair pins and              piercings  Do not wear jewelry, make-up, lotions, powders or perfumes, deodorant             Do not wear nail polish on your fingernails.  Do not shave  48 hours prior to surgery.    Do not bring valuables to the hospital. [place].  Contacts, dentures or bridgework may not be worn into surgery.  Leave suitcase in the car. After surgery it may be brought to your room.     Patients discharged the day of surgery will not be allowed to  drive home. IF  YOU ARE HAVING SURGERY AND GOING HOME THE SAME DAY, YOU MUST HAVE AN ADULT TO DRIVE YOU HOME AND BE WITH YOU FOR 24 HOURS. YOU MAY GO HOME BY TAXI OR UBER OR ORTHERWISE, BUT AN ADULT MUST ACCOMPANY YOU HOME AND STAY WITH YOU FOR 24 HOURS.  Name and phone number of your driver:  Special Instructions: N/A              Please read over the following fact sheets you were given: _____________________________________________________________________        [facility] - Preparing for Surgery Before surgery, you can play an important role.  Because skin is not sterile, your skin needs to be as free of germs as possible.  You can reduce the number of germs on your skin by washing with CHG (chlorahexidine gluconate) soap before surgery.  CHG is an antiseptic cleaner which kills germs and bonds with the skin to continue killing germs even after washing. Please DO NOT use if you have an allergy to CHG or antibacterial soaps.  If your skin becomes reddened/irritated stop using the CHG and inform your nurse when you arrive at Short Stay. Do not shave (including legs and underarms) for at least 48 hours prior to the first CHG shower.  You may shave your face/neck. Please follow these instructions carefully:  1.  Shower with CHG Soap the night before surgery and the  morning of Surgery.  2.  If you choose to wash your hair, wash your hair first as usual with your  normal  shampoo.  3.  After you shampoo, rinse your hair and body thoroughly to remove the  shampoo.                           4.  Use CHG as you would any other liquid soap.  You can apply chg directly  to the skin and wash                       Gently with a scrungie or clean washcloth.  5.  Apply the CHG Soap to your body ONLY FROM THE NECK DOWN.   Do not use on face/ open                           Wound or open sores. Avoid contact with eyes, ears mouth and genitals (private parts).                       Wash face,  Genitals (private  parts) with your normal soap.             6.  Wash thoroughly, paying special attention to the area where your surgery  will be performed.  7.  Thoroughly rinse your body with warm water from the neck down.  8.  DO NOT shower/wash with your normal soap after using and rinsing off  the CHG Soap.                9.  Pat yourself dry with a clean towel.            10.  Wear clean pajamas.            11.  Place clean sheets on your bed the night of your first shower and do not  sleep with pets. Day of Surgery : Do not apply any  lotions/deodorants the morning of surgery.  Please wear clean clothes to the hospital/surgery center.  FAILURE TO FOLLOW THESE INSTRUCTIONS MAY RESULT IN THE CANCELLATION OF YOUR SURGERY PATIENT SIGNATURE_________________________________  NURSE SIGNATURE__________________________________  ________________________________________________________________________   Adam Phenix  An incentive spirometer is a tool that can help keep your lungs clear and active. This tool measures how well you are filling your lungs with each breath. Taking long deep breaths may help reverse or decrease the chance of developing breathing (pulmonary) problems (especially infection) following:  A long period of time when you are unable to move or be active. BEFORE THE PROCEDURE   If the spirometer includes an indicator to show your best effort, your nurse or respiratory therapist will set it to a desired goal.  If possible, sit up straight or lean slightly forward. Try not to slouch.  Hold the incentive spirometer in an upright position. INSTRUCTIONS FOR USE  1. Sit on the edge of your bed if possible, or sit up as far as you can in bed or on a chair. 2. Hold the incentive spirometer in an upright position. 3. Breathe out normally. 4. Place the mouthpiece in your mouth and seal your lips tightly around it. 5. Breathe in slowly and as deeply as possible, raising the piston or the  ball toward the top of the column. 6. Hold your breath for 3-5 seconds or for as long as possible. Allow the piston or ball to fall to the bottom of the column. 7. Remove the mouthpiece from your mouth and breathe out normally. 8. Rest for a few seconds and repeat Steps 1 through 7 at least 10 times every 1-2 hours when you are awake. Take your time and take a few normal breaths between deep breaths. 9. The spirometer may include an indicator to show your best effort. Use the indicator as a goal to work toward during each repetition. 10. After each set of 10 deep breaths, practice coughing to be sure your lungs are clear. If you have an incision (the cut made at the time of surgery), support your incision when coughing by placing a pillow or rolled up towels firmly against it. Once you are able to get out of bed, walk around indoors and cough well. You may stop using the incentive spirometer when instructed by your caregiver.  RISKS AND COMPLICATIONS  Take your time so you do not get dizzy or light-headed.  If you are in pain, you may need to take or ask for pain medication before doing incentive spirometry. It is harder to take a deep breath if you are having pain. AFTER USE  Rest and breathe slowly and easily.  It can be helpful to keep track of a log of your progress. Your caregiver can provide you with a simple table to help with this. If you are using the spirometer at home, follow these instructions: [place] IF:   You are having difficultly using the spirometer.  You have trouble using the spirometer as often as instructed.  Your pain medication is not giving enough relief while using the spirometer.  You develop fever of 100.5 F (38.1 C) or higher. SEEK IMMEDIATE MEDICAL CARE IF:   You cough up bloody sputum that had not been present before.  You develop fever of 102 F (38.9 C) or greater.  You develop worsening pain at or near the incision site. MAKE SURE YOU:    Understand these instructions.  Will watch your condition.  Will get  help right away if you are not doing well or get worse. Document Released: 02/03/2007 Document Revised: 12/16/2011 Document Reviewed: 04/06/2007 [facility] Patient Information 2014 [place], [place].   ________________________________________________________________________

## 2020-07-20 ENCOUNTER — Other Ambulatory Visit (HOSPITAL_COMMUNITY)
Admission: RE | Admit: 2020-07-20 | Discharge: 2020-07-20 | Disposition: A | Discharge status: Home / Self Care | Payer: BC Managed Care – PPO | Source: Ambulatory Visit | Attending: Surgery | Admitting: Surgery

## 2020-07-20 DIAGNOSIS — Z20822 Contact with and (suspected) exposure to covid-19: Secondary | ICD-10-CM | POA: Insufficient documentation

## 2020-07-20 DIAGNOSIS — Z01812 Encounter for preprocedural laboratory examination: Secondary | ICD-10-CM | POA: Diagnosis not present

## 2020-07-20 LAB — SARS CORONAVIRUS 2 (TAT 6-24 HRS): SARS Coronavirus 2: NEGATIVE

## 2020-07-20 NOTE — Progress Notes (Signed)
Lab. Results: potassium: 3.4

## 2020-07-23 MED ORDER — BUPIVACAINE LIPOSOME 1.3 % IJ SUSP
20.0000 mL | Freq: Once | INTRAMUSCULAR | Status: DC
  Filled 2020-07-23: qty 20

## 2020-07-23 NOTE — H&P (Signed)
Sarah Dodson  Location: [facility] Surgery Patient #: [id number] DOB: 07/17/1961 Divorced / Language: English / Race: Black or African American Female  History of Present Illness   The patient is a 59 year old female who presents with obesity.  The PCP is Dr. Clydell Hakim is Dr. Ocie Cornfield  She come by herself.  [The Covid-19 virus has disrupted normal medical care in [place] and across the nation. We have sometimes had to alter normal surgical/medical care to limit this epidemic and we have explained these changes to the patient.]  Her weight loss has stalled. She is now scheduled for surgery. She has completed seen every body for her preop eval. I again stressed any weight loss that she can do before surgery is to her benefit. She has seen the nutritionist/Bernadette and is working on her pre op diet. She has a latex allergy and asked about skin closure. She asked about diclofenac cream.  History of morbid obesity: The goal is for her to lose down to a weight of 315. The nutritionist she found is Tonie Griffith, who used to work for [employer]. She thinks she does not food was eating last week at [facility]. She had diarrhea, vomiting, headaches. Dr. Laurann Montana called in some medicine for her. She is better but still has some epigastric pain. She has done a good job losing weight. 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  She got a cortisone shot in her right ankle recently.   07/23/2019 Weight - 342, BMI - 64.6 10/28/2019 Weight - 338, BMI - 61.8 01/05/2020 Weight - 337, BMI - 61.7 04/05/2020 Weight - 318, BMI - 58.2 07/06/2020 Weight - 322, BMI - 58.9  UGI - 08/03/2019 - Normal (within the limits of her weight) Psych - [place] - 05/11/2020 Nutrition - she saw Ubaldo Glassing - 07/03/2020  Plan: 1. she is for a lap sleeve on 07/24/2020  History of  weight issues (2020.10) She was originally a Dr. Excell Seltzer patient. Patient with progressive morbid obesity unresponsive to multiple efforts at medical management who presents with a BMI of 60.5 and comorbidities of hypertension and degenerative joint disease. She also has mild reflux not on prescription medications. I believe there would be very significant medical benefit from surgical weight loss. We discussed laparoscopic Roux-en-Y gastric bypass vs the sleeve gastrectomy in detail including the nature of the procedure, expected hospitalization and recovery, and major risks of anesthetic complications, bleeding, blood clots and pulmonary emboli, leakage and infection and long-term risks of stricture, ulceration, bowel obstruction, nutritional deficiencies, and failure to lose weight or weight regain. We discussed these problems could lead to death. I gave her one of our booklets on bariatric surgery (I had given this to her before) She is leaning towards a sleeve gastrectomy, in part because of her need for NSAIDs and the risks of ulceration/perforation are higher with the gastric bypass in this setting. Because of her size, I think that a sleeve gastrectomy is the safest.  Review of Systems as stated in this history (HPI) or in the review of systems. Otherwise all other 12 point ROS are negative  Past Medical History: 1. Morbid obesity 2. HTN 3. Osteoarthritis of knees and ankles Right knee replacement - 2018 On NSAIDs for this She has seen Dr. Eather Colas - chronic impingement symptoms of right ankle 4. Psych Ardath Sax - 05/11/2020 5. She had both Covid shots in April  Social History: Divorced. No children. Her niece is a  part time child. Has 2 dogs. 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  Allergies Darden Palmer, [place]; 07/06/2020 8:41 AM) Latex  Allergies Reconciled   Medication History Darden Palmer, [place]; 07/06/2020 8:42 AM) Hydrocortisone Acetate (25MG  Suppository, 1 (one) Rectal two times daily, Taken starting 01/26/2020) Active. Advair Diskus (250-50MCG/DOSE Aero Pow Br Act, Inhalation) Active. Claritin (Oral) Specific strength unknown - Active. Centrum (Oral) Specific strength unknown - Active. Diclofenac Sodium (75MG  Tablet DR, Oral) Active. Triamterene-HCTZ (37.5-25MG  Capsule, Oral) Active. Albuterol (90MCG/ACT Aerosol Soln, Inhalation) Active. Medications Reconciled  Vitals Lattie Haw [place] RMA; 07/06/2020 8:42 AM) 07/06/2020 8:42 AM Weight: 322.25 lb Height: 62in Body Surface Area: 2.34 m Body Mass Index: 58.94 kg/m  Temp.: 98.85F  Pulse: 97 (Regular)  P.OX: 96% (Room air) BP: 124/82(Sitting, Left Arm, Standard)   Physical Exam  General: Very obese AA F who is alert and generally healthy appearing. She is wearing a mask. HEENT: Normal. Pupils equal.  Neck: Supple. No mass. No thyroid mass.  Lymph Nodes: No supraclavicular or cervical nodes.  Lungs: Clear to auscultation and symmetric breath sounds. Heart: RRR. No murmur or rub.  Abdomen: Soft. No mass. No tenderness. No hernia. Normal bowel sounds. No abdominal scars.  She has a diastasis recti  She is much more apple that pear - but she has a lot of weight in her thighs, but she has a very large abdomen. She is almost an apple and a pear. I am not sure that I can tell much difference in her abdomen - but she can tell a difference in clothing she is wearing  Extremities: Good strength and ROM in upper and lower extremities. 1+ lower extremity edema. She has both her shoes loosened so she can wear them, with the right ankle more swollen. Well-healed incision right knee. Moderate deformity both ankles.  Assessment & Plan  1.  MORBID OBESITY WITH BMI OF 50.0-59.9, ADULT   Plan:  1. She is for a lap sleeve gastrectomy on 07/24/2020  2.  HYPERTENSION, BENIGN  (I10) 3.  OSTEOARTHRITIS, KNEE (M17.10)  Right knee replacement - 2018 On NSAIDs for this She has seen Dr. Eather Colas - chronic impingement symptoms of right ankle   Alphonsa Overall, MD, [facility] Surgery Office phone:  [phone]

## 2020-07-24 ENCOUNTER — Encounter (HOSPITAL_COMMUNITY): Payer: Self-pay | Admitting: Surgery

## 2020-07-24 ENCOUNTER — Inpatient Hospital Stay (HOSPITAL_COMMUNITY): Payer: BC Managed Care – PPO | Admitting: Anesthesiology

## 2020-07-24 ENCOUNTER — Encounter (HOSPITAL_COMMUNITY)
Admission: RE | Disposition: A | Discharge status: Home / Self Care | Payer: Self-pay | Source: Ambulatory Visit | Attending: Surgery

## 2020-07-24 ENCOUNTER — Inpatient Hospital Stay (HOSPITAL_COMMUNITY)
Admission: RE | Admit: 2020-07-24 | Discharge: 2020-07-25 | DRG: 621 | Disposition: A | Discharge status: Home / Self Care | Payer: BC Managed Care – PPO | Source: Ambulatory Visit | Attending: Surgery | Admitting: Surgery

## 2020-07-24 ENCOUNTER — Other Ambulatory Visit: Payer: Self-pay

## 2020-07-24 DIAGNOSIS — K219 Gastro-esophageal reflux disease without esophagitis: Secondary | ICD-10-CM | POA: Diagnosis present

## 2020-07-24 DIAGNOSIS — Z20822 Contact with and (suspected) exposure to covid-19: Secondary | ICD-10-CM | POA: Diagnosis present

## 2020-07-24 DIAGNOSIS — M6208 Separation of muscle (nontraumatic), other site: Secondary | ICD-10-CM | POA: Diagnosis present

## 2020-07-24 DIAGNOSIS — K429 Umbilical hernia without obstruction or gangrene: Secondary | ICD-10-CM | POA: Diagnosis present

## 2020-07-24 DIAGNOSIS — Z96651 Presence of right artificial knee joint: Secondary | ICD-10-CM | POA: Diagnosis not present

## 2020-07-24 DIAGNOSIS — Z79899 Other long term (current) drug therapy: Secondary | ICD-10-CM

## 2020-07-24 DIAGNOSIS — J449 Chronic obstructive pulmonary disease, unspecified: Secondary | ICD-10-CM | POA: Diagnosis not present

## 2020-07-24 DIAGNOSIS — Z91040 Latex allergy status: Secondary | ICD-10-CM | POA: Diagnosis not present

## 2020-07-24 DIAGNOSIS — M170 Bilateral primary osteoarthritis of knee: Secondary | ICD-10-CM | POA: Diagnosis present

## 2020-07-24 DIAGNOSIS — I10 Essential (primary) hypertension: Secondary | ICD-10-CM | POA: Diagnosis present

## 2020-07-24 DIAGNOSIS — R600 Localized edema: Secondary | ICD-10-CM | POA: Diagnosis not present

## 2020-07-24 DIAGNOSIS — Z6841 Body Mass Index (BMI) 40.0 and over, adult: Secondary | ICD-10-CM

## 2020-07-24 DIAGNOSIS — K449 Diaphragmatic hernia without obstruction or gangrene: Secondary | ICD-10-CM | POA: Diagnosis not present

## 2020-07-24 HISTORY — PX: LAPAROSCOPIC GASTRIC SLEEVE RESECTION: SHX5895

## 2020-07-24 HISTORY — PX: UPPER GI ENDOSCOPY: SHX6162

## 2020-07-24 LAB — HEMOGLOBIN AND HEMATOCRIT, BLOOD
HCT: 43.4 % (ref 36.0–46.0)
Hemoglobin: 13.9 g/dL (ref 12.0–15.0)

## 2020-07-24 LAB — TYPE AND SCREEN
ABO/RH(D): O POS
Antibody Screen: NEGATIVE

## 2020-07-24 SURGERY — GASTRECTOMY, SLEEVE, LAPAROSCOPIC
Anesthesia: General

## 2020-07-24 MED ORDER — ROCURONIUM BROMIDE 10 MG/ML (PF) SYRINGE
PREFILLED_SYRINGE | INTRAVENOUS | Status: AC
  Filled 2020-07-24: qty 10

## 2020-07-24 MED ORDER — ONDANSETRON HCL 4 MG/2ML IJ SOLN
INTRAMUSCULAR | Status: DC | PRN
  Administered 2020-07-24: 4 mg via INTRAVENOUS

## 2020-07-24 MED ORDER — OXYCODONE HCL 5 MG PO TABS: 5.0000 mg | ORAL_TABLET | Freq: Once | ORAL | Status: DC | PRN

## 2020-07-24 MED ORDER — MIDAZOLAM HCL 2 MG/2ML IJ SOLN: 0.5000 mg | Freq: Once | INTRAMUSCULAR | Status: DC | PRN

## 2020-07-24 MED ORDER — LACTATED RINGERS IV SOLN: INTRAVENOUS | Status: DC

## 2020-07-24 MED ORDER — ACETAMINOPHEN 500 MG PO TABS
1000.0000 mg | ORAL_TABLET | ORAL | Status: AC
  Administered 2020-07-24: 1000 mg via ORAL
  Filled 2020-07-24: qty 2

## 2020-07-24 MED ORDER — PHENYLEPHRINE 40 MCG/ML (10ML) SYRINGE FOR IV PUSH (FOR BLOOD PRESSURE SUPPORT)
PREFILLED_SYRINGE | INTRAVENOUS | Status: DC | PRN
  Administered 2020-07-24: 80 ug via INTRAVENOUS
  Administered 2020-07-24: 120 ug via INTRAVENOUS
  Administered 2020-07-24: 80 ug via INTRAVENOUS
  Administered 2020-07-24: 200 ug via INTRAVENOUS

## 2020-07-24 MED ORDER — CHLORHEXIDINE GLUCONATE 4 % EX LIQD: 60.0000 mL | Freq: Once | CUTANEOUS | Status: DC

## 2020-07-24 MED ORDER — MEPERIDINE HCL 50 MG/ML IJ SOLN: 6.2500 mg | INTRAMUSCULAR | Status: DC | PRN

## 2020-07-24 MED ORDER — OXYCODONE HCL 5 MG/5ML PO SOLN: 5.0000 mg | Freq: Four times a day (QID) | ORAL | Status: DC | PRN

## 2020-07-24 MED ORDER — HYDROMORPHONE HCL 1 MG/ML IJ SOLN
0.2500 mg | INTRAMUSCULAR | Status: DC | PRN
  Administered 2020-07-24 (×3): 0.25 mg via INTRAVENOUS

## 2020-07-24 MED ORDER — PANTOPRAZOLE SODIUM 40 MG IV SOLR
40.0000 mg | Freq: Every day | INTRAVENOUS | Status: DC
  Administered 2020-07-24: 40 mg via INTRAVENOUS
  Filled 2020-07-24: qty 40

## 2020-07-24 MED ORDER — ACETAMINOPHEN 500 MG PO TABS: 1000.0000 mg | ORAL_TABLET | Freq: Three times a day (TID) | ORAL | Status: DC

## 2020-07-24 MED ORDER — MIDAZOLAM HCL 5 MG/5ML IJ SOLN
INTRAMUSCULAR | Status: DC | PRN
  Administered 2020-07-24: 2 mg via INTRAVENOUS

## 2020-07-24 MED ORDER — CHLORHEXIDINE GLUCONATE 0.12 % MT SOLN
15.0000 mL | Freq: Once | OROMUCOSAL | Status: AC
  Administered 2020-07-24: 15 mL via OROMUCOSAL

## 2020-07-24 MED ORDER — FLUTICASONE PROPIONATE 50 MCG/ACT NA SUSP
1.0000 | Freq: Every day | NASAL | Status: DC | PRN
  Filled 2020-07-24: qty 16

## 2020-07-24 MED ORDER — PROMETHAZINE HCL 25 MG/ML IJ SOLN: 6.2500 mg | INTRAMUSCULAR | Status: DC | PRN

## 2020-07-24 MED ORDER — PROPOFOL 10 MG/ML IV BOLUS
INTRAVENOUS | Status: DC | PRN
  Administered 2020-07-24: 140 mg via INTRAVENOUS
  Administered 2020-07-24: 60 mg via INTRAVENOUS

## 2020-07-24 MED ORDER — ENSURE MAX PROTEIN PO LIQD
2.0000 [oz_av] | ORAL | Status: DC
  Administered 2020-07-25 (×2): 2 [oz_av] via ORAL

## 2020-07-24 MED ORDER — ENOXAPARIN SODIUM 30 MG/0.3ML ~~LOC~~ SOLN
30.0000 mg | Freq: Two times a day (BID) | SUBCUTANEOUS | Status: DC
  Administered 2020-07-24 – 2020-07-25 (×2): 30 mg via SUBCUTANEOUS
  Filled 2020-07-24 (×2): qty 0.3

## 2020-07-24 MED ORDER — EPHEDRINE SULFATE-NACL 50-0.9 MG/10ML-% IV SOSY
PREFILLED_SYRINGE | INTRAVENOUS | Status: DC | PRN
  Administered 2020-07-24: 10 mg via INTRAVENOUS

## 2020-07-24 MED ORDER — ONDANSETRON HCL 4 MG/2ML IJ SOLN
INTRAMUSCULAR | Status: AC
  Filled 2020-07-24: qty 2

## 2020-07-24 MED ORDER — BUPIVACAINE-EPINEPHRINE 0.25% -1:200000 IJ SOLN
INTRAMUSCULAR | Status: DC | PRN
  Administered 2020-07-24: 30 mL

## 2020-07-24 MED ORDER — HYDROMORPHONE HCL 1 MG/ML IJ SOLN
INTRAMUSCULAR | Status: AC
  Administered 2020-07-24: 0.25 mg via INTRAVENOUS
  Filled 2020-07-24: qty 1

## 2020-07-24 MED ORDER — FENTANYL CITRATE (PF) 250 MCG/5ML IJ SOLN
INTRAMUSCULAR | Status: AC
  Filled 2020-07-24: qty 5

## 2020-07-24 MED ORDER — TISSEEL VH 10 ML EX KIT
PACK | CUTANEOUS | Status: DC | PRN
  Administered 2020-07-24: 10 mL

## 2020-07-24 MED ORDER — ORAL CARE MOUTH RINSE: 15.0000 mL | Freq: Once | OROMUCOSAL | Status: AC

## 2020-07-24 MED ORDER — DEXAMETHASONE SODIUM PHOSPHATE 10 MG/ML IJ SOLN
INTRAMUSCULAR | Status: AC
  Filled 2020-07-24: qty 1

## 2020-07-24 MED ORDER — OXYCODONE HCL 5 MG/5ML PO SOLN: 5.0000 mg | Freq: Once | ORAL | Status: DC | PRN

## 2020-07-24 MED ORDER — SCOPOLAMINE 1 MG/3DAYS TD PT72
1.0000 | MEDICATED_PATCH | Freq: Once | TRANSDERMAL | Status: DC
  Administered 2020-07-24: 1.5 mg via TRANSDERMAL
  Filled 2020-07-24: qty 1

## 2020-07-24 MED ORDER — KETAMINE HCL 10 MG/ML IJ SOLN
INTRAMUSCULAR | Status: AC
  Filled 2020-07-24: qty 1

## 2020-07-24 MED ORDER — LIDOCAINE 2% (20 MG/ML) 5 ML SYRINGE
INTRAMUSCULAR | Status: AC
  Filled 2020-07-24: qty 5

## 2020-07-24 MED ORDER — GABAPENTIN 300 MG PO CAPS
300.0000 mg | ORAL_CAPSULE | ORAL | Status: AC
  Administered 2020-07-24: 300 mg via ORAL
  Filled 2020-07-24: qty 1

## 2020-07-24 MED ORDER — APREPITANT 40 MG PO CAPS
40.0000 mg | ORAL_CAPSULE | ORAL | Status: AC
  Administered 2020-07-24: 40 mg via ORAL
  Filled 2020-07-24: qty 1

## 2020-07-24 MED ORDER — LACTATED RINGERS IR SOLN
Status: DC | PRN
  Administered 2020-07-24: 1000 mL

## 2020-07-24 MED ORDER — PROPOFOL 10 MG/ML IV BOLUS
INTRAVENOUS | Status: AC
  Filled 2020-07-24: qty 20

## 2020-07-24 MED ORDER — FENTANYL CITRATE (PF) 100 MCG/2ML IJ SOLN
INTRAMUSCULAR | Status: DC | PRN
  Administered 2020-07-24 (×4): 25 ug via INTRAVENOUS
  Administered 2020-07-24: 50 ug via INTRAVENOUS
  Administered 2020-07-24 (×2): 25 ug via INTRAVENOUS
  Administered 2020-07-24: 50 ug via INTRAVENOUS

## 2020-07-24 MED ORDER — MOMETASONE FURO-FORMOTEROL FUM 200-5 MCG/ACT IN AERO
2.0000 | INHALATION_SPRAY | Freq: Two times a day (BID) | RESPIRATORY_TRACT | Status: DC
  Administered 2020-07-24 – 2020-07-25 (×2): 2 via RESPIRATORY_TRACT
  Filled 2020-07-24: qty 8.8

## 2020-07-24 MED ORDER — HEPARIN SODIUM (PORCINE) 5000 UNIT/ML IJ SOLN
5000.0000 [IU] | INTRAMUSCULAR | Status: AC
  Administered 2020-07-24: 5000 [IU] via SUBCUTANEOUS
  Filled 2020-07-24: qty 1

## 2020-07-24 MED ORDER — DEXAMETHASONE SODIUM PHOSPHATE 10 MG/ML IJ SOLN
INTRAMUSCULAR | Status: DC | PRN
  Administered 2020-07-24: 8 mg via INTRAVENOUS

## 2020-07-24 MED ORDER — MIDAZOLAM HCL 2 MG/2ML IJ SOLN
INTRAMUSCULAR | Status: AC
  Filled 2020-07-24: qty 2

## 2020-07-24 MED ORDER — ACETAMINOPHEN 160 MG/5ML PO SOLN
1000.0000 mg | Freq: Three times a day (TID) | ORAL | Status: DC
  Administered 2020-07-24 – 2020-07-25 (×2): 1000 mg via ORAL
  Filled 2020-07-24 (×2): qty 40.6

## 2020-07-24 MED ORDER — ALBUTEROL SULFATE HFA 108 (90 BASE) MCG/ACT IN AERS
2.0000 | INHALATION_SPRAY | Freq: Four times a day (QID) | RESPIRATORY_TRACT | Status: DC | PRN
  Filled 2020-07-24: qty 6.7

## 2020-07-24 MED ORDER — TISSEEL VH 10 ML EX KIT
PACK | CUTANEOUS | Status: AC
  Filled 2020-07-24: qty 1

## 2020-07-24 MED ORDER — MORPHINE SULFATE (PF) 4 MG/ML IV SOLN
1.0000 mg | INTRAVENOUS | Status: DC | PRN
  Administered 2020-07-24: 2 mg via INTRAVENOUS
  Filled 2020-07-24: qty 1

## 2020-07-24 MED ORDER — ROCURONIUM BROMIDE 100 MG/10ML IV SOLN
INTRAVENOUS | Status: DC | PRN
  Administered 2020-07-24: 10 mg via INTRAVENOUS
  Administered 2020-07-24: 60 mg via INTRAVENOUS

## 2020-07-24 MED ORDER — LIDOCAINE HCL (CARDIAC) PF 100 MG/5ML IV SOSY
PREFILLED_SYRINGE | INTRAVENOUS | Status: DC | PRN
  Administered 2020-07-24: 60 mg via INTRAVENOUS

## 2020-07-24 MED ORDER — ONDANSETRON HCL 4 MG/2ML IJ SOLN: 4.0000 mg | INTRAMUSCULAR | Status: DC | PRN

## 2020-07-24 MED ORDER — BUPIVACAINE LIPOSOME 1.3 % IJ SUSP
INTRAMUSCULAR | Status: DC | PRN
  Administered 2020-07-24: 20 mL

## 2020-07-24 MED ORDER — SUGAMMADEX SODIUM 200 MG/2ML IV SOLN
INTRAVENOUS | Status: DC | PRN
  Administered 2020-07-24: 200 mg via INTRAVENOUS

## 2020-07-24 MED ORDER — BUPIVACAINE-EPINEPHRINE 0.25% -1:200000 IJ SOLN
INTRAMUSCULAR | Status: AC
  Filled 2020-07-24: qty 1

## 2020-07-24 MED ORDER — SODIUM CHLORIDE 0.9 % IV SOLN
2.0000 g | INTRAVENOUS | Status: AC
  Administered 2020-07-24: 2 g via INTRAVENOUS
  Filled 2020-07-24: qty 2

## 2020-07-24 MED ORDER — KCL IN DEXTROSE-NACL 20-5-0.45 MEQ/L-%-% IV SOLN
INTRAVENOUS | Status: DC
  Filled 2020-07-24 (×2): qty 1000

## 2020-07-24 MED ORDER — GLYCOPYRROLATE 0.2 MG/ML IJ SOLN
INTRAMUSCULAR | Status: DC | PRN
  Administered 2020-07-24: .2 mg via INTRAVENOUS

## 2020-07-24 SURGICAL SUPPLY — 58 items
ADH SKN CLS APL DERMABOND .7 (GAUZE/BANDAGES/DRESSINGS) ×1
APL PRP STRL LF DISP 70% ISPRP (MISCELLANEOUS) ×1
APL SRG 32X5 SNPLK LF DISP (MISCELLANEOUS) ×1
APPLIER CLIP ROT 13.4 12 LRG (CLIP) ×2
APR CLP LRG 13.4X12 ROT 20 MLT (CLIP) ×1
BLADE SURG 15 STRL LF DISP TIS (BLADE) ×1 IMPLANT
BLADE SURG 15 STRL SS (BLADE) ×2
CABLE HIGH FREQUENCY MONO STRZ (ELECTRODE) IMPLANT
CHLORAPREP W/TINT 26 (MISCELLANEOUS) ×2 IMPLANT
CLIP APPLIE ROT 13.4 12 LRG (CLIP) ×1 IMPLANT
COVER WAND RF STERILE (DRAPES) IMPLANT
DECANTER SPIKE VIAL GLASS SM (MISCELLANEOUS) ×2 IMPLANT
DERMABOND ADVANCED (GAUZE/BANDAGES/DRESSINGS) ×1
DERMABOND ADVANCED .7 DNX12 (GAUZE/BANDAGES/DRESSINGS) ×1 IMPLANT
DEVICE SUT QUICK LOAD TK 5 (STAPLE) ×2 IMPLANT
DEVICE SUT TI-KNOT TK 5X26 (MISCELLANEOUS) IMPLANT
DEVICE SUTURE ENDOST 10MM (ENDOMECHANICALS) ×2 IMPLANT
DISSECTOR BLUNT TIP ENDO 5MM (MISCELLANEOUS) IMPLANT
ELECT REM PT RETURN 15FT ADLT (MISCELLANEOUS) ×2 IMPLANT
GLOVE SURG SYN 7.5  E (GLOVE) ×2
GLOVE SURG SYN 7.5 E (GLOVE) ×1 IMPLANT
GOWN STRL REUS W/TWL XL LVL3 (GOWN DISPOSABLE) ×6 IMPLANT
GRASPER SUT TROCAR 14GX15 (MISCELLANEOUS) IMPLANT
KIT BASIN OR (CUSTOM PROCEDURE TRAY) ×2 IMPLANT
KIT TURNOVER KIT A (KITS) IMPLANT
MARKER SKIN DUAL TIP RULER LAB (MISCELLANEOUS) ×2 IMPLANT
MAT PREVALON FULL STRYKER (MISCELLANEOUS) ×2 IMPLANT
NEEDLE SPNL 22GX3.5 QUINCKE BK (NEEDLE) ×2 IMPLANT
PACK CARDIOVASCULAR III (CUSTOM PROCEDURE TRAY) ×2 IMPLANT
RELOAD STAPLER BLUE 60MM (STAPLE) ×2 IMPLANT
RELOAD STAPLER GOLD 60MM (STAPLE) ×2 IMPLANT
RELOAD STAPLER GREEN 60MM (STAPLE) ×3 IMPLANT
SCISSORS LAP 5X35 DISP (ENDOMECHANICALS) IMPLANT
SEALANT SURGICAL APPL DUAL CAN (MISCELLANEOUS) ×2 IMPLANT
SET IRRIG TUBING LAPAROSCOPIC (IRRIGATION / IRRIGATOR) ×2 IMPLANT
SET TUBE SMOKE EVAC HIGH FLOW (TUBING) ×2 IMPLANT
SHEARS HARMONIC ACE PLUS 45CM (MISCELLANEOUS) ×2 IMPLANT
SLEEVE ADV FIXATION 5X100MM (TROCAR) ×4 IMPLANT
SLEEVE GASTRECTOMY 36FR VISIGI (MISCELLANEOUS) ×2 IMPLANT
SOL ANTI FOG 6CC (MISCELLANEOUS) ×1 IMPLANT
SOLUTION ANTI FOG 6CC (MISCELLANEOUS) ×1
SPONGE LAP 18X18 RF (DISPOSABLE) ×2 IMPLANT
STAPLER ECHELON LONG 60 440 (INSTRUMENTS) ×2 IMPLANT
STAPLER RELOAD BLUE 60MM (STAPLE) ×4
STAPLER RELOAD GOLD 60MM (STAPLE) ×4
STAPLER RELOAD GREEN 60MM (STAPLE) ×6
SUT MNCRL AB 4-0 PS2 18 (SUTURE) ×2 IMPLANT
SUT SURGIDAC NAB ES-9 0 48 120 (SUTURE) ×2 IMPLANT
SUT VICRYL 0 TIES 12 18 (SUTURE) IMPLANT
SYR 10ML ECCENTRIC (SYRINGE) ×2 IMPLANT
SYR CONTROL 10ML LL (SYRINGE) ×2 IMPLANT
TOWEL OR 17X26 10 PK STRL BLUE (TOWEL DISPOSABLE) ×2 IMPLANT
TOWEL OR NON WOVEN STRL DISP B (DISPOSABLE) ×2 IMPLANT
TROCAR ADV FIXATION 12X100MM (TROCAR) ×2 IMPLANT
TROCAR ADV FIXATION 5X100MM (TROCAR) ×2 IMPLANT
TROCAR BLADELESS 15MM (ENDOMECHANICALS) ×2 IMPLANT
TROCAR BLADELESS OPT 5 100 (ENDOMECHANICALS) ×2 IMPLANT
TUBING CONNECTING 10 (TUBING) ×2 IMPLANT

## 2020-07-24 NOTE — Interval H&P Note (Signed)
History and Physical Interval Note:  07/24/2020 9:30 AM  Sarah Dodson  has presented today for surgery, with the diagnosis of MORBID OBESITY.  The various methods of treatment have been discussed with the patient and family.  Her contact person is her niece, Malyia Moro.  After consideration of risks, benefits and other options for treatment, the patient has consented to  Procedure(s): LAPAROSCOPIC GASTRIC SLEEVE RESECTION (N/A) UPPER GI ENDOSCOPY (N/A) as a surgical intervention.  The patient's history has been reviewed, patient examined, no change in status, stable for surgery.  I have reviewed the patient's chart and labs.  Questions were answered to the patient's satisfaction.     Shann Medal

## 2020-07-24 NOTE — Discharge Instructions (Signed)
GASTRIC BYPASS/SLEEVE  Home Care Instructions   These instructions are to help you care for yourself when you go home.  Call: If you have any problems. . Call [phone] and ask for the surgeon on call . If you need immediate help, come to the ER at [facility].  . Tell the ER staff that you are a new post-op gastric bypass or gastric sleeve patient   Signs and symptoms to report: . Severe vomiting or nausea o If you cannot keep down clear liquids for longer than 1 day, call your surgeon  . Abdominal pain that does not get better after taking your pain medication . Fever over 100.4 F with chills . Heart beating over 100 beats a minute . Shortness of breath at rest . Chest pain .  Redness, swelling, drainage, or foul odor at incision (surgical) sites .  If your incisions open or pull apart . Swelling or pain in calf (lower leg) . Diarrhea (Loose bowel movements that happen often), frequent watery, uncontrolled bowel movements . Constipation, (no bowel movements for 3 days) if this happens: Pick one o Milk of Magnesia, 2 tablespoons by mouth, 3 times a day for 2 days if needed o Stop taking Milk of Magnesia once you have a bowel movement o Call your doctor if constipation continues Or o Miralax  (instead of Milk of Magnesia) following the label instructions o Stop taking Miralax once you have a bowel movement o Call your doctor if constipation continues . Anything you think is not normal   Normal side effects after surgery: . Unable to sleep at night or unable to focus . Irritability or moody . Being tearful (crying) or depressed These are common complaints, possibly related to your anesthesia medications that put you to sleep, stress of surgery, and change in lifestyle.  This usually goes away a few weeks after surgery.  If these feelings continue, call your primary care doctor.   Wound Care: You may have surgical glue, steri-strips, or staples over your incisions after  surgery . Surgical glue:  Looks like a clear film over your incisions and will wear off a little at a time . Steri-strips: Strips of tape over your incisions. You may notice a yellowish color on the skin under the steri-strips. This is used to make the   steri-strips stick better. Do not pull the steri-strips off - let them fall off . Staples: Jodell Cipro may be removed before you leave the hospital o If you go home with staples, call [facility], [phone]) [phone] at for an appointment with your surgeon's nurse to have staples removed 10 days after surgery. . Showering: You may shower two (2) days after your surgery unless your surgeon tells you differently o Wash gently around incisions with warm soapy water, rinse well, and gently pat dry  o No tub baths until staples are removed, steri-strips fall off or glue is gone.    Medications: Marland Kitchen Medications should be liquid or crushed if larger than the size of a dime . Extended release pills (medication that release a little bit at a time through the day) should NOT be crushed or cut. (examples include XL, ER, DR, SR) . Depending on the size and number of medications you take, you may need to space (take a few throughout the day)/change the time you take your medications so that you do not over-fill your pouch (smaller stomach) . Make sure you follow-up with your primary care doctor to  make medication changes needed during rapid weight loss and life-style changes . If you have diabetes, follow up with the doctor that orders your diabetes medication(s) within one week after surgery and check your blood sugar regularly. . Do not drive while taking prescription pain medication  . It is ok to take Tylenol by the bottle instructions with your pain medicine or instead of your pain medicine as needed.  DO NOT TAKE NSAIDS (EXAMPLES OF NSAIDS:  IBUPROFREN/ NAPROXEN)  Diet:                    First 2 Weeks  You will see the dietician t about two (2) weeks  after your surgery. The dietician will increase the types of foods you can eat if you are handling liquids well: Marland Kitchen If you have severe vomiting or nausea and cannot keep down clear liquids lasting longer than 1 day, call your surgeon @ [phone]) Protein Shake . Drink at least 2 ounces of shake 5-6 times per day . Each serving of protein shakes (usually 8 - 12 ounces) should have: o 15 grams of protein  o And no more than 5 grams of carbohydrate  . Goal for protein each day: o Men = 80 grams per day o Women = 60 grams per day . Protein powder may be added to fluids such as non-fat milk or Lactaid milk or unsweetened Soy/Almond milk (limit to 35 grams added protein powder per serving)  Hydration . Slowly increase the amount of water and other clear liquids as tolerated (See Acceptable Fluids) . Slowly increase the amount of protein shake as tolerated  .  Sip fluids slowly and throughout the day.  Do not use straws. . May use sugar substitutes in small amounts (no more than 6 - 8 packets per day; i.e. Splenda)  Fluid Goal . The first goal is to drink at least 8 ounces of protein shake/drink per day (or as directed by the nutritionist); some examples of protein shakes are [employer] Johnson, AMR Corporation, EAS Edge HP, and Unjury.  See handout from pre-op Bariatric Education Class: o Slowly increase the amount of protein shake you drink as tolerated o You may find it easier to slowly sip shakes throughout the day o It is important to get your proteins in first . Your fluid goal is to drink 64 - 100 ounces of fluid daily o It may take a few weeks to build up to this . 32 oz (or more) should be clear liquids  And  . 32 oz (or more) should be full liquids (see below for examples) . Liquids should not contain sugar, caffeine, or carbonation  Clear Liquids: . Water or Sugar-free flavored water (i.e. Fruit H2O, Propel) . Decaffeinated coffee or tea (sugar-free) . [employer], [employer],  Minute [facility] . Sugar-free Jell-O . Bouillon or broth . Sugar-free Popsicle:   *Less than 20 calories each; Limit 1 per day  Full Liquids: Protein Shakes/Drinks + 2 choices per day of other full liquids . Full liquids must be: o No More Than 15 grams of Carbs per serving  o No More Than 3 grams of Fat per serving . Strained low-fat cream soup (except Cream of Potato or Tomato) . Non-Fat milk . Fat-free Lactaid Milk . Unsweetened Soy Or Unsweetened Almond Milk . Low Sugar yogurt (Dannon Lite & Fit, [place] yogurt; Oikos Triple Zero; Chobani Simply 100; Yoplait 100 calorie [place] - No Fruit on the [place])  Vitamins and Minerals . Start 1 day after surgery unless otherwise directed by your surgeon . Chewable Bariatric Specific Multivitamin / Multimineral Supplement with iron (Example: Bariatric Advantage Multi EA) . Chewable Calcium with Vitamin D-3 (Example: 3 Chewable Calcium Plus 600 with Vitamin D-3) o Take 500 mg three (3) times a day for a total of 1500 mg each day o Do not take all 3 doses of calcium at one time as it may cause constipation, and you can only absorb 500 mg  at a time  o Do not mix multivitamins containing iron with calcium supplements; take 2 hours apart . Menstruating women and those with a history of anemia (a blood disease that causes weakness) may need extra iron o Talk with your doctor to see if you need more iron . Do not stop taking or change any vitamins or minerals until you talk to your dietitian or surgeon . Your Dietitian and/or surgeon must approve all vitamin and mineral supplements   Activity and Exercise: Limit your physical activity as instructed by your doctor.  It is important to continue walking at home.  During this time, use these guidelines: . Do not lift anything greater than ten (10) pounds for at least two (2) weeks . Do not go back to work or drive until Engineer, production says you can . You may have sex when you feel comfortable  o It is  VERY important for female patients to use a reliable birth control method; fertility often increases after surgery  o All hormonal birth control will be ineffective for 30 days after surgery due to medications given during surgery a barrier method must be used. o Do not get pregnant for at least 18 months . Start exercising as soon as your doctor tells you that you can o Make sure your doctor approves any physical activity . Start with a simple walking program . Walk 5-15 minutes each day, 7 days per week.  . Slowly increase until you are walking 30-45 minutes per day Consider joining our [place] program. [phone] or email [email or web address]   Special Instructions Things to remember: . Use your CPAP when sleeping if this applies to you  . [facility] has two free Bariatric Surgery Support Groups that meet monthly o The 3rd Thursday of each month, 6 pm o The 2nd Friday of each month, 11: 30 . It is very important to keep all follow up appointments with your surgeon, dietitian, primary care physician, and behavioral health practitioner . Routine follow up schedule with your surgeon include appointments at 2-3 weeks, 6-8 weeks, 6 months, and 1 year at a minimum.  Your surgeon may request to see you more often.   . After the first year, please follow up with your bariatric surgeon and dietitian at least once a year in order to maintain best weight loss results   [facility]: [place]: [phone] Bariatric Nurse Coordinator: [phone]      Reviewed and Endorsed  by [facility] Patient Education Committee, June, 2016 Edits Approved: Aug, 2018

## 2020-07-24 NOTE — Progress Notes (Signed)
Discussed post op day goals with patient including ambulation, IS, diet progression, pain, and nausea control.  BSTOP education provided including BSTOP information guide, "Guide for Pain Management after your Bariatric Procedure".  Questions answered. 

## 2020-07-24 NOTE — Transfer of Care (Signed)
Immediate Anesthesia Transfer of Care Note  Patient: Sarah Dodson  Procedure(s) Performed: LAPAROSCOPIC GASTRIC SLEEVE RESECTION; REPAIR OF HIATAL HERNIA (N/A ) UPPER GI ENDOSCOPY (N/A )  Patient Location: PACU  Anesthesia Type:General  Level of Consciousness: awake, alert , oriented and patient cooperative  Airway & Oxygen Therapy: Patient Spontanous Breathing and Patient connected to face mask oxygen  Post-op Assessment: Report given to RN and Post -op Vital signs reviewed and stable  Post vital signs: Reviewed and stable  Last Vitals:  Vitals Value Taken Time  BP 145/76 07/24/20 1215  Temp    Pulse 87 07/24/20 1219  Resp 17 07/24/20 1219  SpO2 100 % 07/24/20 1219  Vitals shown include unvalidated device data.  Last Pain:  Vitals:   07/24/20 0819  TempSrc:   PainSc: 0-No pain         Complications: No complications documented.

## 2020-07-24 NOTE — Op Note (Signed)
Sarah Dodson [id number] April 23, 1961 07/24/2020  Preoperative diagnosis: severe obesity  Postoperative diagnosis: Same   Procedure: upper endoscopy   Surgeon: Leighton Ruff. Nicklaus Alviar M.D., FACS   Anesthesia: Gen.   Indications for procedure: 59 y.o. year old female undergoing Laparoscopic Gastric Sleeve Resection and an EGD was requested to evaluate the new gastric sleeve.   Description of procedure: After we have completed the sleeve resection, I scrubbed out and obtained the Olympus endoscope. I gently placed endoscope in the patient's oropharynx and gently glided it down the esophagus without any difficulty under direct visualization. Once I was in the gastric sleeve, I insufflated the stomach with air. I was able to cannulate and advanced the scope through the gastric sleeve. I was able to cannulate the duodenum with ease. Dr. Lucia Gaskins had placed saline in the upper abdomen. Upon further insufflation of the gastric sleeve there was no evidence of bubbles. GE junction located at 39 cm.  Upon further inspection of the gastric sleeve, the mucosa appeared normal. There is no evidence of any mucosal abnormality. The sleeve was widely patent at the angularis. There was no evidence of bleeding. The gastric sleeve was decompressed. The scope was withdrawn. The patient tolerated this portion of the procedure well. Please see Dr Pollie Friar operative note for details regarding the laparoscopic gastric sleeve resection.   Leighton Ruff. Redmond Pulling, MD, FACS  General, Bariatric, & Minimally Invasive Surgery  [facility], [place]

## 2020-07-24 NOTE — Anesthesia Postprocedure Evaluation (Signed)
Anesthesia Post Note  Patient: Manju Kulkarni  Procedure(s) Performed: LAPAROSCOPIC GASTRIC SLEEVE RESECTION; REPAIR OF HIATAL HERNIA (N/A ) UPPER GI ENDOSCOPY (N/A )     Patient location during evaluation: PACU Anesthesia Type: General Level of consciousness: awake and alert, patient cooperative and oriented Pain management: pain level controlled Vital Signs Assessment: post-procedure vital signs reviewed and stable Respiratory status: spontaneous breathing, nonlabored ventilation and respiratory function stable Cardiovascular status: blood pressure returned to baseline and stable Postop Assessment: no apparent nausea or vomiting Anesthetic complications: no   No complications documented.  Last Vitals:  Vitals:   07/24/20 1245 07/24/20 1300  BP: (!) 145/75 (!) 144/79  Pulse: 88 93  Resp: 15 17  Temp:    SpO2: 99% 96%    Last Pain:  Vitals:   07/24/20 1300  TempSrc:   PainSc: 5                  Elster Corbello,E. Ylonda Storr

## 2020-07-24 NOTE — Anesthesia Procedure Notes (Signed)
Procedure Name: Intubation Date/Time: 07/24/2020 10:24 AM Performed by: Garrel Ridgel, CRNA Pre-anesthesia Checklist: Patient identified, Emergency Drugs available, Suction available and Patient being monitored Patient Re-evaluated:Patient Re-evaluated prior to induction Oxygen Delivery Method: Circle system utilized Preoxygenation: Pre-oxygenation with 100% oxygen Induction Type: IV induction Ventilation: Mask ventilation without difficulty Laryngoscope Size: Mac and 3 Grade View: Grade I Tube type: Oral Tube size: 7.0 mm Number of attempts: 2 Airway Equipment and Method: Stylet and Oral airway Placement Confirmation: ETT inserted through vocal cords under direct vision,  positive ETCO2 and breath sounds checked- equal and bilateral Secured at: 22 cm Tube secured with: Tape Dental Injury: Teeth and Oropharynx as per pre-operative assessment

## 2020-07-24 NOTE — Op Note (Addendum)
PATIENT:   Sarah Dodson DOB:   22-Sep-1961 MRN:   [id number]  DATE OF PROCEDURE: 07/24/2020                   FACILITY:  [facility]  OPERATIVE REPORT  PREOPERATIVE DIAGNOSIS:  Morbid obesity.  POSTOPERATIVE DIAGNOSIS:  Morbid obesity (weight 322, BMI of 58.9), hiatal hernia, small umbilical hernia  PROCEDURE:  Laparoscopic sleeve gastrectomy (intraoperative upper endoscopy by Dr. Ok Anis), repair of hiatal hernia  SURGEON:  Fenton Malling. Lucia Gaskins, MD  FIRST ASSISTANTFidel Levy, MD  ANESTHESIA:  General endotracheal.  Anesthesiologist: Annye Asa, MD CRNA: Raenette Rover, CRNA; Flynn-Cook, Margaret A, CRNA  General  ESTIMATED BLOOD LOSS:  Minimal.  LOCAL ANESTHESIA:  30 cc of 1/4% Marcaine and 20 cc of Exparel  COMPLICATIONS:  None.  INDICATION FOR SURGERY:  Sarah Dodson is a 59 y.o. AA female who sees Lavone Orn, MD as her primary care doctor.  She has completed our preoperative bariatric program and now comes for a laparoscopic sleeve gastrectomy.  The indications, potential complications of surgery were explained to the patient.  Potential complications of the surgery include, but are not limited to, bleeding, infection, DVT, open surgery, and long-term nutritional consequences.  OPERATIVE NOTE:  The patient taken to room #1 at [facility] where [name] underwent a general endotracheal anesthetic, supervised by Anesthesiologist: Annye Asa, MD CRNA: Raenette Rover, CRNA; Flynn-Cook, Kerrie [place], CRNA.  The patient was given 2 g of cefotetan at the beginning of the procedure.  A time-out was held and surgical checklist run.  I accessed her abdominal cavity through the left upper quadrant with a 5 mm Optiview. I did an abdominal exploration.   Her omentum and bowel were unremarkable. The right and left lobes of the liver unremarkable. Gallbladder was normal. Her stomach was unremarkable.  She had some omentum in a 1.5 cm umbilical hernia.  This was  left alone and can be addressed in the future, if it becomes larger or symptomatic.  I placed a total of 5 trocars. I placed a 5 mm left lateral trocar, a 5 mm left paramedian trocar (for the scope), a 12 mm right paramedian torcar, a 5 mm right subcostal trocar that I converted to a 15 mm to extract the stomach and 5 mm subxiphoid trocar for the liver retractor.  I placed a abdominal wall anesthetic block using a mixture of 1/4% Marcaine and Exparel.  I used 20 cc per side, for a total of 40 cc.  Though her UGI did not show a hiatal hernia, I was able to reduce a knot of fat posterior to the esophago-gastric junction.  She had a small hiatal hernia.  So I dissected to the right of esophago-gastric junction, found the left and right crura posteriorly, and placed a 0 Ethibond suture posteriorly through the crura, and secured the suture with a Tye Knot.  I started out taking down the greater curvature attachments of the stomach. I measured approximately 6 cm proximal from the pylorus and mobilized the greater curvature of the stomach with the Harmonic Scalpel. I took this dissection cranially around the greater curvature of her stomach to the angle of His and the left crus.   After I had mobilized the greater curvature of the stomach, I then passed the 36 French ViSiGi bougie which was used to suck the stomach up against the lesser curvature and the tip of the ViSiGi was placed into the antrum. During the  staple firing,  I tried to give the Marquette a cuff at least about 1 cm. I tried to avoid narrowing the incisura. I used a total of 6 staple firings.  From antrum to the angle of His, I used 2 green, 2 gold and 2 blue [place] 60 mm Ethicon staplers. I did not use staple line reinforcement.   At each firing of the EndoGIA stapler, I inspected the stomach, anterior wall of the stomach, and underneath to make sure there was no compromise or impingement on to the ViSiGi bougie.   The staple line seemed linear  without any corkscrewing of the stomach. Hemostasis was good. I did not use any reinforcement. She did have at least 3 areas of bleeding which I used clips to clip on the new greater curvature of the stomach.  Because I thought we had a good staple line, I then had the ViSiGi was converted to insufflate the pouch. The new stomach pouch was placed under water. There was no bubbling or leak noted.   At this point, Dr. Redmond Pulling broke scrub and passed an upper endoscope down through the esophagus into the stomach pouch. The stomach was tubular. There was no narrowing of the stomach pouch or angulation. We were easily able to pass the endoscope into the antrum and again put air pressure on the staple line. I irrigated the upper abdomen with saline. There was no bubbling or evidence of air leak. The mucosa looked viable. Dr. Redmond Pulling decompressed the stomach with the endoscopy.   I converted to right subcostal trocar to a 15 trocar and extracted the stomach remnant through this intact and sent this to pathology. I then placed 10 cc of Tisseel along the new greater curvature staple line and covered the entire staple line with the Tisseel.  I aspirated out the saline that I had irrigated because I thought the staple line looked viable and complete. There was no evidence of leak. I did not leave a drain in place.   Then, I closed the trocar sites. I placed 0 Vicryl sutures at the 15-mm port site in the right upper quadrant. The other port sites seemed smaller not requiring sutures. I closed the skin at each site with a 4-0 Monocryl, painted each wound with Dermabond.   I have a surgeon as a first assist to retract, expose, and assist on this difficult operation.  The patient was transported to recovery room in good condition. Sponge and needle count were correct at the end of the case.    Alphonsa Overall, MD, [facility] Pager: [phone] Office phone:  [phone]

## 2020-07-24 NOTE — Anesthesia Preprocedure Evaluation (Addendum)
Anesthesia Evaluation  Patient identified by MRN, date of birth, ID band Patient awake    Reviewed: Allergy & Precautions, NPO status , Patient's Chart, lab work & pertinent test results  History of Anesthesia Complications (+) PONV  Airway Mallampati: II  TM Distance: >3 FB Neck ROM: Full    Dental  (+) Dental Advisory Given   Pulmonary COPD,  COPD inhaler, former smoker,  07/20/2020 SARS coronavirus NEG   breath sounds clear to auscultation       Cardiovascular hypertension, Pt. on medications (-) angina Rhythm:Regular Rate:Normal     Neuro/Psych negative neurological ROS     GI/Hepatic Neg liver ROS, GERD  Controlled,  Endo/Other  Morbid obesity  Renal/GU negative Renal ROS     Musculoskeletal  (+) Arthritis ,   Abdominal (+) + obese,   Peds  Hematology negative hematology ROS (+)   Anesthesia Other Findings   Reproductive/Obstetrics                            Anesthesia Physical Anesthesia Plan  ASA: III  Anesthesia Plan: General   Post-op Pain Management:    Induction: Intravenous  PONV Risk Score and Plan: 4 or greater and Scopolamine patch - Pre-op, Dexamethasone and Ondansetron  Airway Management Planned: Oral ETT  Additional Equipment: None  Intra-op Plan:   Post-operative Plan: Extubation in OR  Informed Consent: I have reviewed the patients History and Physical, chart, labs and discussed the procedure including the risks, benefits and alternatives for the proposed anesthesia with the patient or authorized representative who has indicated his/her understanding and acceptance.     Dental advisory given  Plan Discussed with: CRNA and Surgeon  Anesthesia Plan Comments:        Anesthesia Quick Evaluation

## 2020-07-24 NOTE — Progress Notes (Signed)
PHARMACY CONSULT FOR:  Risk Assessment for Post-Discharge VTE Following Bariatric Surgery  Post-Discharge VTE Risk Assessment: This patient's probability of 30-day post-discharge VTE is increased due to the factors marked:   Female    Age >/=60 years   X BMI >/=50 kg/m2    CHF    Dyspnea at Rest    Paraplegia  X  Non-gastric-band surgery    Operation Time >/=3 hr    Return to OR     Length of Stay >/= 3 d   Hx of VTE   Hypercoagulable condition   Significant venous stasis   Predicted probability of 30-day post-discharge VTE: 0.27%  Other patient-specific factors to consider:  Recommendation for Discharge: No pharmacologic prophylaxis post-discharge  Sarah Dodson is a 58 y.o. female who underwent Laparoscopic Gastric sleeve resection on 10/18   Case start: 1043 Case end: 1206   Allergies  Allergen Reactions  . Latex Itching   Patient Measurements: Height: 5\' 2"  (157.5 cm) Weight: (!) 140.9 kg (310 lb 9.6 oz) IBW/kg (Calculated) : 50.1 Body mass index is 56.81 kg/m.  No results for input(s): WBC, HGB, HCT, PLT, APTT, CREATININE, LABCREA, CREATININE, CREAT24HRUR, MG, PHOS, ALBUMIN, PROT, ALBUMIN, AST, ALT, ALKPHOS, BILITOT, BILIDIR, IBILI in the last 72 hours. Estimated Creatinine Clearance: 95 mL/min (by C-G formula based on SCr of 0.87 mg/dL).    Past Medical History:  Diagnosis Date  . Arthritis   . Asthma   . Bronchitis   . Esophagitis   . GERD (gastroesophageal reflux disease)   . Hypertension   . Obesity   . PONV (postoperative nausea and vomiting)    nausea with the LEFT ankle surgery     Medications Prior to Admission  Medication Sig Dispense Refill Last Dose  . Calcium-Phosphorus-Vitamin D (CALCIUM/D3 ADULT GUMMIES PO) Take 2 tablets by mouth daily with lunch.   07/23/2020 at Unknown time  . diclofenac (VOLTAREN) 75 MG EC tablet TAKE 1 TABLET BY MOUTH TWICE A DAY WITH FOOD (Patient taking differently: Take 75 mg by mouth daily. ) 180  tablet 3 07/17/2020  . fluticasone (FLONASE) 50 MCG/ACT nasal spray Place 1 spray into both nostrils daily as needed for allergies or rhinitis.   Past Month at Unknown time  . Fluticasone-Salmeterol (ADVAIR) 250-50 MCG/DOSE AEPB Inhale 1 puff into the lungs 2 (two) times daily.   07/24/2020 at [place]  . loratadine (ALAVERT) 10 MG tablet Take 10 mg by mouth daily.   07/24/2020 at [place]  . Multiple Vitamins-Minerals (ADULT ONE DAILY GUMMIES PO) Take 2 tablets by mouth daily. Alive Women's 50+ (Gummy)   07/23/2020 at Unknown time  . triamterene-hydrochlorothiazide (DYAZIDE) 37.5-25 MG per capsule Take 1 capsule by mouth daily.   07/24/2020 at [place]  . vitamin B-12 (CYANOCOBALAMIN) 1000 MCG tablet Take 2,000 tablets by mouth daily with lunch. [employer]'s [employer] Gummy    07/23/2020 at Unknown time  . albuterol (PROVENTIL HFA;VENTOLIN HFA) 108 (90 BASE) MCG/ACT inhaler Inhale 2 puffs into the lungs every 6 (six) hours as needed for wheezing or shortness of breath.   More than a month at 0645  . Calcium Carbonate-Vit D-Min (CALTRATE 600+D PLUS MINERALS) 600-800 MG-UNIT CHEW Chew 2 each by mouth daily. (Patient not taking: Reported on 07/06/2020)   Not Taking at Unknown time  . cephALEXin (KEFLEX) 500 MG capsule Take 1 capsule the night before. Take 1 capsule by mouth TID day of procedure (Patient not taking: Reported on 07/06/2020) 4 capsule 2 Not Taking at Unknown time  .  [facility] ASPIRIN EC 325 MG EC tablet TAKE 1 TABLET BY MOUTH EVERY DAY (Patient not taking: Reported on 07/06/2020) 30 tablet 0 Not Taking at Unknown time  . Multiple Vitamins-Minerals (MULTIVITAMIN WITH MINERALS) tablet Take 1 tablet by mouth daily. (Patient not taking: Reported on 07/06/2020)   Not Taking at Unknown time  . oxyCODONE-acetaminophen (ROXICET) 5-325 MG tablet Take 1 tablet by mouth 2 (two) times daily as needed for severe pain. (Patient not taking: Reported on 07/06/2020) 30 tablet 0 Not Taking at Unknown time   Minda Ditto  PharmD 07/24/2020,2:14 PM

## 2020-07-25 ENCOUNTER — Encounter (HOSPITAL_COMMUNITY): Payer: Self-pay | Admitting: Surgery

## 2020-07-25 ENCOUNTER — Other Ambulatory Visit (HOSPITAL_COMMUNITY): Payer: Self-pay | Admitting: Surgery

## 2020-07-25 LAB — CBC WITH DIFFERENTIAL/PLATELET
Abs Immature Granulocytes: 0.04 10*3/uL (ref 0.00–0.07)
Basophils Absolute: 0 10*3/uL (ref 0.0–0.1)
Basophils Relative: 0 %
Eosinophils Absolute: 0 10*3/uL (ref 0.0–0.5)
Eosinophils Relative: 0 %
HCT: 38 % (ref 36.0–46.0)
Hemoglobin: 12.5 g/dL (ref 12.0–15.0)
Immature Granulocytes: 0 %
Lymphocytes Relative: 11 %
Lymphs Abs: 1.2 10*3/uL (ref 0.7–4.0)
MCH: 28.3 pg (ref 26.0–34.0)
MCHC: 32.9 g/dL (ref 30.0–36.0)
MCV: 86.2 fL (ref 80.0–100.0)
Monocytes Absolute: 0.7 10*3/uL (ref 0.1–1.0)
Monocytes Relative: 7 %
Neutro Abs: 9 10*3/uL — ABNORMAL HIGH (ref 1.7–7.7)
Neutrophils Relative %: 82 %
Platelets: 380 10*3/uL (ref 150–400)
RBC: 4.41 MIL/uL (ref 3.87–5.11)
RDW: 13.7 % (ref 11.5–15.5)
WBC: 11 10*3/uL — ABNORMAL HIGH (ref 4.0–10.5)
nRBC: 0 % (ref 0.0–0.2)

## 2020-07-25 LAB — SURGICAL PATHOLOGY

## 2020-07-25 MED ORDER — TRAMADOL HCL 50 MG PO TABS: 50.0000 mg | ORAL_TABLET | Freq: Four times a day (QID) | ORAL | 0 refills | Status: DC | PRN

## 2020-07-25 MED FILL — traMADol HCL 50 MG TABS: 50 | 2 days supply | Qty: 10 | Fill #0

## 2020-07-25 NOTE — Discharge Summary (Signed)
Physician Discharge Summary  Patient ID:  Sarah Dodson  MRN: [id number]  DOB/AGE: 60-Nov-1962 60 y.o.  Admit date: 07/24/2020 Discharge date: 07/25/2020  Discharge Diagnoses:  1.  MORBID OBESITY WITH BMI OF 50.0-59.9, ADULT 2.  Hiatal hernia 3.  HYPERTENSION, BENIGN  4.  OSTEOARTHRITIS, KNEE  5.  Umbilical hernia   Active Problems:   Morbid obesity with BMI of 50.0-59.9, adult ([place])  Operation: Procedure(s):  LAPAROSCOPIC GASTRIC SLEEVE RESECTION; REPAIR OF HIATAL HERNIA, UPPER GI ENDOSCOPY on 07/24/2020  Discharged Condition: good  Hospital Course: Sarah Dodson is an 59 y.o. female whose primary care physician is Lavone Orn, MD and who was admitted 07/24/2020 with a chief complaint of morbid obesity.   She was brought to the operating room on 07/24/2020 and underwent LAPAROSCOPIC GASTRIC SLEEVE RESECTION; REPAIR OF HIATAL HERNIA, UPPER GI ENDOSCOPY.  She was also found to have a small umbilical hernia which was left alone during surgery.   She is now one day post op, taking po's well, and ready for discharge. She already has Zofran and Protonix at home.  I wrote Tramadol for pain at home.  The discharge instructions were reviewed with the patient.  Consults: None  Significant Diagnostic Studies: Results for orders placed or performed during the hospital encounter of 07/24/20  Hemoglobin and hematocrit, blood  Result Value Ref Range   Hemoglobin 13.9 12.0 - 15.0 g/dL   HCT 43.4 36 - 46 %  CBC WITH DIFFERENTIAL  Result Value Ref Range   WBC 11.0 (H) 4.0 - 10.5 K/uL   RBC 4.41 3.87 - 5.11 MIL/uL   Hemoglobin 12.5 12.0 - 15.0 g/dL   HCT 38.0 36 - 46 %   MCV 86.2 80.0 - 100.0 fL   MCH 28.3 26.0 - 34.0 pg   MCHC 32.9 30.0 - 36.0 g/dL   RDW 13.7 11.5 - 15.5 %   Platelets 380 150 - 400 K/uL   nRBC 0.0 0.0 - 0.2 %   Neutrophils Relative % 82 %   Neutro Abs 9.0 (H) 1.7 - 7.7 K/uL   Lymphocytes Relative 11 %   Lymphs Abs 1.2 0.7 - 4.0 K/uL    Monocytes Relative 7 %   Monocytes Absolute 0.7 0.1 - 1.0 K/uL   Eosinophils Relative 0 %   Eosinophils Absolute 0.0 0.0 - 0.5 K/uL   Basophils Relative 0 %   Basophils Absolute 0.0 0.0 - 0.1 K/uL   Immature Granulocytes 0 %   Abs Immature Granulocytes 0.04 0.00 - 0.07 K/uL    No results found.  Discharge Exam:  Vitals:   07/25/20 0604 07/25/20 0805  BP: (!) 144/68   Pulse: 81   Resp: 18   Temp: 98.2 F (36.8 C)   SpO2: 94% 97%    General: Obese AA F who is alert and generally healthy appearing.  Lungs: Clear to auscultation and symmetric breath sounds.  She pulls 1,200 cc on IS Heart:  RRR. No murmur or rub. Abdomen: Soft. No mass. Normal bowel sounds.  Her incisions look good.  Discharge Medications:   Allergies as of 07/25/2020      Reactions   Latex Itching      Medication List    STOP taking these medications   cephALEXin 500 MG capsule Commonly known as: Keflex   oxyCODONE-acetaminophen 5-325 MG tablet Commonly known as: Roxicet   vitamin B-12 1000 MCG tablet Commonly known as: CYANOCOBALAMIN     TAKE these medications   Alavert 10 MG tablet  Generic drug: loratadine Take 10 mg by mouth daily.   albuterol 108 (90 Base) MCG/ACT inhaler Commonly known as: VENTOLIN HFA Inhale 2 puffs into the lungs every 6 (six) hours as needed for wheezing or shortness of breath.   CALCIUM/D3 ADULT GUMMIES PO Take 2 tablets by mouth daily with lunch.   Caltrate 600+D Plus Minerals 600-800 MG-UNIT Chew Chew 2 each by mouth daily.   [facility]S Aspirin EC 325 MG EC tablet Generic drug: aspirin TAKE 1 TABLET BY MOUTH EVERY DAY Notes to patient: Avoid NSAIDs for 6-8 weeks after surgery   diclofenac 75 MG EC tablet Commonly known as: VOLTAREN TAKE 1 TABLET BY MOUTH TWICE A DAY WITH FOOD What changed: when to take this   fluticasone 50 MCG/ACT nasal spray Commonly known as: FLONASE Place 1 spray into both nostrils daily as needed for allergies or rhinitis.    Fluticasone-Salmeterol 250-50 MCG/DOSE Aepb Commonly known as: ADVAIR Inhale 1 puff into the lungs 2 (two) times daily.   multivitamin with minerals tablet Take 1 tablet by mouth daily.   ADULT ONE DAILY GUMMIES PO Take 2 tablets by mouth daily. Alive Women's 50+ (Gummy)   traMADol 50 MG tablet Commonly known as: ULTRAM Take 1 tablet (50 mg total) by mouth every 6 (six) hours as needed (pain).   triamterene-hydrochlorothiazide 37.5-25 MG capsule Commonly known as: DYAZIDE Take 1 capsule by mouth daily. Notes to patient: Monitor Blood Pressure Daily and keep a log for primary care physician.  You may need to make changes to your medications with rapid weight loss.         Disposition: Discharge disposition: 01-Home or Self Care       Discharge Instructions    Ambulate hourly while awake   Complete by: As directed    Call MD for:  difficulty breathing, headache or visual disturbances   Complete by: As directed    Call MD for:  persistant dizziness or light-headedness   Complete by: As directed    Call MD for:  persistant nausea and vomiting   Complete by: As directed    Call MD for:  redness, tenderness, or signs of infection (pain, swelling, redness, odor or green/yellow discharge around incision site)   Complete by: As directed    Call MD for:  severe uncontrolled pain   Complete by: As directed    Call MD for:  temperature >101 F   Complete by: As directed    Diet bariatric full liquid   Complete by: As directed    Incentive spirometry   Complete by: As directed    Perform hourly while awake       Follow-up Information    Alphonsa Overall, MD. Go on 08/10/2020.   Specialty: General Surgery Why: at 345 pm Contact information: [address] [place] [address] [phone]        Carlena Hurl, PA-C. Go on 09/19/2020.   Specialty: General Surgery Why: at 130 pm Contact information: [place]  [address] [phone]                Signed: Alphonsa Overall, M.D., [facility] Office:  [phone]  07/25/2020, 9:40 AM

## 2020-07-25 NOTE — Progress Notes (Signed)
Patient alert and oriented, pain is controlled. Patient is tolerating fluids, advanced to protein shake today, patient is tolerating well.  Reviewed Gastric sleeve discharge instructions with patient and patient is able to articulate understanding.  Provided information on BELT program, Support Group and [facility] outpatient pharmacy. All questions answered, will continue to monitor.  Total fluid intake 660 Per dehydration protocol callback one week post op

## 2020-07-25 NOTE — Progress Notes (Signed)
Patient alert and oriented, Post op day 1.  Provided support and encouragement.  Encouraged pulmonary toilet, ambulation and small sips of liquids.  Completed 12 ounces of bari clear fluid ane 11 ounces protein shake.  All questions answered.  Will continue to monitor.

## 2020-07-25 NOTE — Progress Notes (Signed)
Nutrition Brief Note  RD consulted for diet education for patient s/p bariatric surgery. Bariatric nurse coordinator providing education.   If nutrition issues arise, please consult RD.   Clayton Bibles, MS, RD, LDN Inpatient Clinical Dietitian Contact information available via Amion

## 2020-07-31 ENCOUNTER — Telehealth (HOSPITAL_COMMUNITY): Payer: Self-pay

## 2020-07-31 NOTE — Telephone Encounter (Addendum)
Patient called to discuss post bariatric surgery follow up questions.  No answer voicemail left with patient.  See below:   1.  Tell me about your pain and pain management?denies  2.  Let's talk about fluid intake.  How much total fluid are you taking in? 51+ ounces or more of fluid  3.  How much protein have you taken in the last 2 days?30-45 grams of protein  4.  Have you had nausea?  Tell me about when have experienced nausea and what you did to help?denies  5.  Has the frequency or color changed with your urine?clear and light in color darker in the morning  6.  Tell me what your incisions look like?itching abdomen called office for reaction  7.  Have you been passing gas? BM?had bm x 3 since discharge  8.  If a problem or question were to arise who would you call?  Do you know contact numbers for [facility], [facility], and [facility]?aware of how to contact all services  9.  How has the walking going?walking around house, walk to mailbox and at store for additional movement  10.  How are your vitamins and calcium going?  How are you taking them?mvi and calcium started

## 2020-08-08 ENCOUNTER — Other Ambulatory Visit: Payer: Self-pay

## 2020-08-08 ENCOUNTER — Encounter: Payer: BC Managed Care – PPO | Attending: Surgery | Admitting: Skilled Nursing Facility1

## 2020-08-08 DIAGNOSIS — Z6841 Body Mass Index (BMI) 40.0 and over, adult: Secondary | ICD-10-CM | POA: Insufficient documentation

## 2020-08-08 NOTE — Progress Notes (Signed)
2 Week Post-Operative Nutrition Class   Patient was seen on 12/01/18 for Post-Operative Nutrition education at [facility].    Surgery date: 07/24/2020 Surgery type: Sleeve Gastrectomy  Start weight at [facility]: 335.6 (07/27/2018) Weight today: 291.1   Body Composition Scale 08/08/2020  Total Body Fat % 51  Visceral Fat 26  Fat-Free Mass % 48.9   Total Body Water % 38.9   Muscle-Mass lbs 28.7  Body Fat Displacement          Torso  lbs 92.2         Left Leg  lbs 18.4         Right Leg  lbs 18.4         Left Arm  lbs 9.2         Right Arm   lbs 9.2     The following the learning objectives were met by the patient during this course:  Identifies Phase 3 (Soft, High Proteins) Dietary Goals and will begin from 2 weeks post-operatively to 2 months post-operatively  Identifies appropriate sources of fluids and proteins   Identifies appropriate fat sources and healthy verses unhealthy fat types    States protein recommendations and appropriate sources post-operatively  Identifies the need for appropriate texture modifications, mastication, and bite sizes when consuming solids  Identifies appropriate multivitamin and calcium sources post-operatively  Describes the need for physical activity post-operatively and will follow MD recommendations  States when to call healthcare provider regarding medication questions or post-operative complications   Handouts given during class include:  Phase 3A: Soft, High Protein Diet Handout  Phase 3 High Protein Meals  Healthy Fats   Follow-Up Plan: Patient will follow-up at [facility] in 6 weeks for 2 month post-op nutrition visit for diet advancement per MD.

## 2020-08-11 DIAGNOSIS — Z0000 Encounter for general adult medical examination without abnormal findings: Secondary | ICD-10-CM | POA: Diagnosis not present

## 2020-08-11 DIAGNOSIS — I10 Essential (primary) hypertension: Secondary | ICD-10-CM | POA: Diagnosis not present

## 2020-08-11 DIAGNOSIS — J4530 Mild persistent asthma, uncomplicated: Secondary | ICD-10-CM | POA: Diagnosis not present

## 2020-08-14 ENCOUNTER — Telehealth: Payer: Self-pay | Admitting: Skilled Nursing Facility1

## 2020-08-14 NOTE — Telephone Encounter (Signed)
RD called pt to verify fluid intake once starting soft, solid proteins 2 week post-bariatric surgery.   Daily Fluid intake: 64+  Daily Protein intake: 60+  Concerns/issues:   No concerns  

## 2020-09-08 DIAGNOSIS — Z23 Encounter for immunization: Secondary | ICD-10-CM | POA: Diagnosis not present

## 2020-09-20 ENCOUNTER — Encounter: Payer: BC Managed Care – PPO | Attending: Surgery | Admitting: Skilled Nursing Facility1

## 2020-09-20 ENCOUNTER — Other Ambulatory Visit: Payer: Self-pay

## 2020-09-20 DIAGNOSIS — E669 Obesity, unspecified: Secondary | ICD-10-CM | POA: Diagnosis not present

## 2020-09-20 NOTE — Progress Notes (Signed)
Bariatric Nutrition Follow-Up Visit Medical Nutrition Therapy   NUTRITION ASSESSMENT    Anthropometrics  Surgery date: 07/24/2020 Surgery type: Sleeve Gastrectomy  Start weight at [facility]: 335.6 (07/27/2018) Weight today: 276.7   Body Composition Scale 08/08/2020 09/20/2020  Total Body Fat % 51 50.2  Visceral Fat 26 24  Fat-Free Mass % 48.9 49.7   Total Body Water % 38.9 39.3   Muscle-Mass lbs 28.7 28.4  Body Fat Displacement           Torso  lbs 92.2 86.3         Left Leg  lbs 18.4 17.2         Right Leg  lbs 18.4 17.2         Left Arm  lbs 9.2 8.6         Right Arm   lbs 9.2 8.6    Clinical  Medical hx:  Medications:  Labs:    Lifestyle & Dietary Hx  Pt state she really wants to chug her water but realizes it is best to sip her fluids.  Pt states she had to put her dog down recently which has been hard.  Pt states she is ready to switch to a capsule multivitamin.    Estimated daily fluid intake: 60+ oz Estimated daily protein intake: 60+ g Supplements: multi and calcium Current average weekly physical activity: Pool 1-2 days a week  24-Hr Dietary Recall First Meal 9am: protein supplement Snack:  Second Meal 11:30: protein supplement + beans Snack:  Third Meal: beans + salmon or pulled pork or beef or [place] Snack:  Beverages: water, Gatorade zero  Post-Op Goals/ Signs/ Symptoms Using straws: no Drinking while eating: no Chewing/swallowing difficulties: no Changes in vision: no Changes to mood/headaches: no Hair loss/changes to skin/nails: no Difficulty focusing/concentrating: no Sweating: no Dizziness/lightheadedness: no Palpitations: no  Carbonated/caffeinated beverages: no N/V/D/C/Gas: no Abdominal pain: no Dumping syndrome: no    NUTRITION DIAGNOSIS  Overweight/obesity ([place]-3.3) related to past poor dietary habits and physical inactivity as evidenced by completed bariatric surgery and following dietary guidelines for continued weight loss and  healthy nutrition status.     NUTRITION INTERVENTION Nutrition counseling (C-1) and education (E-2) to facilitate bariatric surgery goals, including: . Diet advancement to the next phase now including non starchy vegetables  . The importance of consuming adequate calories as well as certain nutrients daily due to the body's need for essential vitamins, minerals, and fats . The importance of daily physical activity and to reach a goal of at least 150 minutes of moderate to vigorous physical activity weekly (or as directed by their physician) due to benefits such as increased musculature and improved lab values . The importance of intuitive eating specifically learning hunger-satiety cues and understanding the importance of learning a new body: The importance of mindful eating to avoid grazing behaviors   Goals: -Continue to aim for a minimum of 64 fluid ounces 7 days a week with at least 30 ounces being plain water -Eat non-starchy vegetables 2 times a day 7 days a week -Start out with soft cooked vegetables today and tomorrow; if tolerated begin to eat raw vegetables or cooked including salads -Eat your 3 ounces of protein first then start in on your non-starchy vegetables; once you understand how much of your meal leads to satisfaction and not full while still eating 3 ounces of protein and non-starchy vegetables you can eat them in any order  -Continue to aim for 30 minutes of activity at least  5 times a week -Do NOT cook with/add to your food: alfredo sauce, cheese sauce, barbeque sauce, ketchup, fat back, butter, bacon grease, grease, Crisco, OR SUGAR -Be sure to chew foods for meals rather than rely on protein supplements   Handouts Provided Include   Non starchy vegetables   Learning Style & Readiness for Change Teaching method utilized: Visual & Auditory  Demonstrated degree of understanding via: Teach Back  Readiness Level: Action Barriers to learning/adherence to lifestyle change:  non identified   RD's Notes for Next Visit . Assess adherence to pt chosen goals   MONITORING & EVALUATION Dietary intake, weekly physical activity, body weight  Next Steps Patient is to follow-up in 3 months

## 2020-10-12 DIAGNOSIS — J029 Acute pharyngitis, unspecified: Secondary | ICD-10-CM | POA: Diagnosis not present

## 2020-10-13 DIAGNOSIS — J029 Acute pharyngitis, unspecified: Secondary | ICD-10-CM | POA: Diagnosis not present

## 2020-10-30 ENCOUNTER — Telehealth: Payer: Self-pay | Admitting: Skilled Nursing Facility1

## 2020-10-30 NOTE — Telephone Encounter (Signed)
Hi Sarah Dodson, Hope all is going well for you in this New Year! I am doing well and getting over COVID???? Found out I had it 3 weeks ago and only had cold symptoms. So glad I'm fully vaccinated! Anyway, just want to know when I can eat fruit and nuts? Blueberries especially since they help with my arthritis pain.  Sarah Dodson  Feb 23, 1961 Sleeved 07/24/20  Hi [name]. I am so sorry to hear you had Covid but happy hear you are feeling better. The typical progression is to add fruit not at the next visit but the visit after that.

## 2020-11-15 DIAGNOSIS — Z6841 Body Mass Index (BMI) 40.0 and over, adult: Secondary | ICD-10-CM | POA: Diagnosis not present

## 2020-11-15 DIAGNOSIS — Z01419 Encounter for gynecological examination (general) (routine) without abnormal findings: Secondary | ICD-10-CM | POA: Diagnosis not present

## 2020-11-15 DIAGNOSIS — Z1231 Encounter for screening mammogram for malignant neoplasm of breast: Secondary | ICD-10-CM | POA: Diagnosis not present

## 2020-12-12 ENCOUNTER — Encounter: Payer: BC Managed Care – PPO | Attending: Surgery | Admitting: Skilled Nursing Facility1

## 2020-12-12 ENCOUNTER — Other Ambulatory Visit: Payer: Self-pay

## 2020-12-12 DIAGNOSIS — Z6841 Body Mass Index (BMI) 40.0 and over, adult: Secondary | ICD-10-CM | POA: Insufficient documentation

## 2020-12-13 NOTE — Progress Notes (Signed)
Follow-up visit:  Post-Operative sleeve Surgery  Medical Nutrition Therapy:  Appt start time: 6:00pm end time:  7:00pm  Primary concerns today: Post-operative Bariatric Surgery Nutrition Management 6 Month Post-Op Class  Anthropometrics  Surgery date: 07/24/2020 Surgery type: Sleeve Gastrectomy  Start weight at [facility]: 335.6 (07/27/2018) Weight today: 271.3   Body Composition Scale 08/08/2020 09/20/2020 12/12/2020  Total Body Fat % 51 50.2 50.1  Visceral Fat 26 24 24   Fat-Free Mass % 48.9 49.7 49.8   Total Body Water % 38.9 39.3 39.4   Muscle-Mass lbs 28.7 28.4 28  Body Fat Displacement            Torso  lbs 92.2 86.3 84.4         Left Leg  lbs 18.4 17.2 16.8         Right Leg  lbs 18.4 17.2 16.8         Left Arm  lbs 9.2 8.6 8.4         Right Arm   lbs 9.2 8.6 8.4     Information Reviewed/ Discussed During Appointment: -Review of composition scale numbers -Fluid requirements (64-100 ounces) -Protein requirements (60-80g) -Strategies for tolerating diet -Advancement of diet to include Starchy vegetables -Barriers to inclusion of new foods -Inclusion of appropriate multivitamin and calcium supplements  -Exercise recommendations   Fluid intake: adequate   Medications: See List Supplementation: appropriate    Using straws: no Drinking while eating: no Having you been chewing well: yes Chewing/swallowing difficulties: no Changes in vision: no Changes to mood/headaches: no Hair loss/[place]es to skin/Changes to nails: no Any difficulty focusing or concentrating: no Sweating: no Dizziness/Lightheaded: no Palpitations: no  Carbonated beverages: no N/V/D/C/GAS: no Abdominal Pain: no Dumping syndrome: no  Recent physical activity:  ADL's  Progress Towards Goal(s):  In Progress Teaching method utilized: Visual & Auditory  Demonstrated degree of understanding via: Teach Back  Readiness Level: Action Barriers to learning/adherence to lifestyle change: none  identified  Handouts given during visit include:  Phase V diet Progression   Goals Sheet  The Benefits of Exercise are endless.....  Support Group Topics  Teaching Method Utilized:  Visual Auditory Hands on  Demonstrated degree of understanding via:  Teach Back   Monitoring/Evaluation:  Dietary intake, exercise, and body weight. Follow up in 3 months for 9 month post-op visit.

## 2021-01-26 DIAGNOSIS — I10 Essential (primary) hypertension: Secondary | ICD-10-CM | POA: Diagnosis not present

## 2021-01-26 DIAGNOSIS — K912 Postsurgical malabsorption, not elsewhere classified: Secondary | ICD-10-CM | POA: Diagnosis not present

## 2021-01-26 DIAGNOSIS — Z6841 Body Mass Index (BMI) 40.0 and over, adult: Secondary | ICD-10-CM | POA: Diagnosis not present

## 2021-01-26 DIAGNOSIS — Z903 Acquired absence of stomach [part of]: Secondary | ICD-10-CM | POA: Diagnosis not present

## 2021-02-09 DIAGNOSIS — I10 Essential (primary) hypertension: Secondary | ICD-10-CM | POA: Diagnosis not present

## 2021-02-09 DIAGNOSIS — J4530 Mild persistent asthma, uncomplicated: Secondary | ICD-10-CM | POA: Diagnosis not present

## 2021-03-13 ENCOUNTER — Other Ambulatory Visit: Payer: Self-pay

## 2021-03-13 ENCOUNTER — Encounter: Payer: BC Managed Care – PPO | Attending: Surgery | Admitting: Skilled Nursing Facility1

## 2021-03-13 DIAGNOSIS — Z6841 Body Mass Index (BMI) 40.0 and over, adult: Secondary | ICD-10-CM | POA: Insufficient documentation

## 2021-03-13 NOTE — Progress Notes (Signed)
Follow-up visit:  Post-Operative sleeve Surgery  Post-operative Bariatric Surgery Nutrition Management  Anthropometrics  Surgery date: 07/24/2020 Surgery type: Sleeve Gastrectomy  Start weight at [facility]: 335.6 (07/27/2018) Weight today: 274.3 pounds   Body Composition Scale 08/08/2020 09/20/2020 12/12/2020 03/13/2021  Total Body Fat % 51 50.2 50.1 50.1  Visceral Fat 26 24 24 24   Fat-Free Mass % 48.9 49.7 49.8 49.8   Total Body Water % 38.9 39.3 39.4 39.4   Muscle-Mass lbs 28.7 28.4 28 28.4  Body Fat Displacement             Torso  lbs 92.2 86.3 84.4 85.2         Left Leg  lbs 18.4 17.2 16.8 17         Right Leg  lbs 18.4 17.2 16.8 17         Left Arm  lbs 9.2 8.6 8.4 8.5         Right Arm   lbs 9.2 8.6 8.4 8.5    Pt states she was taking a multivitamin without iron but was taking a chewable iron but is now taking a 65mg  swallowable iron. Pt states she did get hooked on regular life savers but has been cutting them out. 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 Pt states she has been working over time so has been busy. [facility]. Pt states she feels pressed for time when she eats dinner.  Pt states she understand she is gaining weight due to the cheating patterns with her meals she has.  Pt states her sister has been discouraging.  Pt states she feels she is doing well and only wants to work on chewing better.   24 hr recall: First meal 8:30: nuts Snack 9am: protein drink Second meal 11:30: unjury chicken soup Snack:  Third meal: salmon + steamed veggies (on [employer]) or pizza or ribs Snack:  Beverages: water, gatorade zero, protein drink, water + flavoring  Fluid intake: 64  Medications: See List Supplementation: multi without iron + iron supplement   Education: Encouraged patient to honor their body's internal hunger and fullness cues.  Throughout the day, check in mentally and rate  hunger. Stop eating when satisfied not full regardless of how much food is left on the plate.  Get more if still hungry 20-30 minutes later.  The key is to honor satisfaction so throughout the meal, rate fullness factor and stop when comfortably satisfied not physically full. The key is to honor hunger and fullness without any feelings of guilt or shame.  Pay attention to what the internal cues are, rather than any external factors. This will enhance the confidence you have in listening to your own body and following those internal cues enabling you to increase how often you eat when you are hungry not out of appetite and stop when you are satisfied not full.  Encouraged pt to continue to eat balanced meals inclusive of non starchy vegetables 2 times a day 7 days a week Encouraged pt to choose lean protein sources: limiting beef, pork, sausage, hotdogs, and lunch meat Encourage pt to choose healthy fats such as plant based limiting animal fats Encouraged pt to continue to drink a minium 64 fluid ounces with half being plain water to satisfy proper hydration    Using straws: no Drinking while eating: no Having you been chewing well: yes Chewing/swallowing difficulties:  no Changes in vision: no Changes to mood/headaches: no Hair loss/Cahnges to skin/Changes to nails: no Any difficulty focusing or concentrating: no Sweating: no Dizziness/Lightheaded: no Palpitations: no  Carbonated beverages: no N/V/D/C/GAS: no Abdominal Pain: no Dumping syndrome: no  Recent physical activity:  Swimming or personal trainer   Progress Towards Goal(s):  In Progress Teaching method utilized: Environmental health practitioner & Auditory  Demonstrated degree of understanding via: Teach Back  Readiness Level: Action Barriers to learning/adherence to lifestyle change: none identified  Teaching Method Utilized:  Visual Auditory Hands on  Demonstrated degree of understanding via:  Teach Back   Monitoring/Evaluation:  Dietary intake,  exercise, and body weight. Follow up in August

## 2021-05-22 ENCOUNTER — Other Ambulatory Visit: Payer: Self-pay

## 2021-05-22 ENCOUNTER — Encounter: Payer: BC Managed Care – PPO | Attending: Surgery | Admitting: Skilled Nursing Facility1

## 2021-05-22 DIAGNOSIS — Z6841 Body Mass Index (BMI) 40.0 and over, adult: Secondary | ICD-10-CM | POA: Diagnosis not present

## 2021-05-24 NOTE — Progress Notes (Signed)
Bariatric Class:  Appt start time: 6:00 end time: 7:00  12 Month Post-Operative Nutrition Class  Patient was seen on 05/22/2021 for Post[facility]ative Nutrition [facility].   Anthropometrics  Surgery date: 07/24/2020 Surgery type: Sleeve Gastrectomy  Start weight at [facility]: 335.6 (07/27/2018) Weight today: pt declined   Pt states she has been eating more simple carbohydrates but is working on it.    The following the learning objectives were met by the patient during this course: Review of TANITA scale information Share and discuss bariatric surgery successes and non-scale victories Identifies Phase VII (Maintenance Phase) Dietary Goals which will be lifelong Identifies appropriate sources of fluids, proteins, non-starchy vegetables, and complex carbohydrates Identifies well-balanced meals Identifies portion control  Identifies appropriate multivitamin and calcium sources post-operatively Describes the need for physical activity post-operatively and will follow MD recommendations Identifies and describes SMART goals  Creates at least 2 SMART goals to begin immediately States when to call healthcare provider regarding medication questions or post-operative complications  Teaching method utilized: Visual & Auditory  Demonstrated degree of understanding via: Teach Back  Readiness Level: Action Barriers to learning/adherence to lifestyle change: None Identified  Handouts given during class include: Phase VII: Maintenance Phase-Lifelong  Follow-Up Plan: Patient will follow-up at [facility] for on-going post-op nutrition visits.

## 2021-07-13 DIAGNOSIS — M2142 Flat foot [pes planus] (acquired), left foot: Secondary | ICD-10-CM | POA: Diagnosis not present

## 2021-07-13 DIAGNOSIS — M2141 Flat foot [pes planus] (acquired), right foot: Secondary | ICD-10-CM | POA: Diagnosis not present

## 2021-07-13 DIAGNOSIS — M25572 Pain in left ankle and joints of left foot: Secondary | ICD-10-CM | POA: Diagnosis not present

## 2021-07-13 DIAGNOSIS — M25571 Pain in right ankle and joints of right foot: Secondary | ICD-10-CM | POA: Diagnosis not present

## 2021-08-02 DIAGNOSIS — Z86010 Personal history of colonic polyps: Secondary | ICD-10-CM | POA: Diagnosis not present

## 2021-08-17 DIAGNOSIS — Z9884 Bariatric surgery status: Secondary | ICD-10-CM | POA: Diagnosis not present

## 2021-08-17 DIAGNOSIS — I10 Essential (primary) hypertension: Secondary | ICD-10-CM | POA: Diagnosis not present

## 2021-08-17 DIAGNOSIS — E611 Iron deficiency: Secondary | ICD-10-CM | POA: Diagnosis not present

## 2021-08-17 DIAGNOSIS — J4530 Mild persistent asthma, uncomplicated: Secondary | ICD-10-CM | POA: Diagnosis not present

## 2021-08-17 DIAGNOSIS — Z6841 Body Mass Index (BMI) 40.0 and over, adult: Secondary | ICD-10-CM | POA: Diagnosis not present

## 2021-08-17 DIAGNOSIS — Z0000 Encounter for general adult medical examination without abnormal findings: Secondary | ICD-10-CM | POA: Diagnosis not present

## 2021-08-17 DIAGNOSIS — Z13220 Encounter for screening for lipoid disorders: Secondary | ICD-10-CM | POA: Diagnosis not present

## 2021-11-07 DIAGNOSIS — R7309 Other abnormal glucose: Secondary | ICD-10-CM | POA: Diagnosis not present

## 2021-12-05 DIAGNOSIS — R7309 Other abnormal glucose: Secondary | ICD-10-CM | POA: Diagnosis not present

## 2021-12-17 DIAGNOSIS — J4530 Mild persistent asthma, uncomplicated: Secondary | ICD-10-CM | POA: Diagnosis not present

## 2021-12-17 DIAGNOSIS — J302 Other seasonal allergic rhinitis: Secondary | ICD-10-CM | POA: Diagnosis not present

## 2021-12-20 DIAGNOSIS — Z6841 Body Mass Index (BMI) 40.0 and over, adult: Secondary | ICD-10-CM | POA: Diagnosis not present

## 2021-12-20 DIAGNOSIS — J069 Acute upper respiratory infection, unspecified: Secondary | ICD-10-CM | POA: Diagnosis not present

## 2021-12-20 DIAGNOSIS — Z713 Dietary counseling and surveillance: Secondary | ICD-10-CM | POA: Diagnosis not present

## 2022-01-05 DIAGNOSIS — R7309 Other abnormal glucose: Secondary | ICD-10-CM | POA: Diagnosis not present

## 2022-02-04 DIAGNOSIS — R7309 Other abnormal glucose: Secondary | ICD-10-CM | POA: Diagnosis not present

## 2022-02-22 DIAGNOSIS — I10 Essential (primary) hypertension: Secondary | ICD-10-CM | POA: Diagnosis not present

## 2022-02-22 DIAGNOSIS — Z5181 Encounter for therapeutic drug level monitoring: Secondary | ICD-10-CM | POA: Diagnosis not present

## 2022-02-22 DIAGNOSIS — J4530 Mild persistent asthma, uncomplicated: Secondary | ICD-10-CM | POA: Diagnosis not present

## 2022-02-22 DIAGNOSIS — Z6841 Body Mass Index (BMI) 40.0 and over, adult: Secondary | ICD-10-CM | POA: Diagnosis not present

## 2022-03-07 DIAGNOSIS — R7309 Other abnormal glucose: Secondary | ICD-10-CM | POA: Diagnosis not present

## 2022-04-06 DIAGNOSIS — R7309 Other abnormal glucose: Secondary | ICD-10-CM | POA: Diagnosis not present

## 2022-04-17 DIAGNOSIS — Z1151 Encounter for screening for human papillomavirus (HPV): Secondary | ICD-10-CM | POA: Diagnosis not present

## 2022-04-17 DIAGNOSIS — Z124 Encounter for screening for malignant neoplasm of cervix: Secondary | ICD-10-CM | POA: Diagnosis not present

## 2022-04-17 DIAGNOSIS — Z113 Encounter for screening for infections with a predominantly sexual mode of transmission: Secondary | ICD-10-CM | POA: Diagnosis not present

## 2022-04-17 DIAGNOSIS — Z6841 Body Mass Index (BMI) 40.0 and over, adult: Secondary | ICD-10-CM | POA: Diagnosis not present

## 2022-04-17 DIAGNOSIS — Z1231 Encounter for screening mammogram for malignant neoplasm of breast: Secondary | ICD-10-CM | POA: Diagnosis not present

## 2022-04-17 DIAGNOSIS — Z01419 Encounter for gynecological examination (general) (routine) without abnormal findings: Secondary | ICD-10-CM | POA: Diagnosis not present

## 2022-04-22 ENCOUNTER — Other Ambulatory Visit: Payer: Self-pay | Admitting: Obstetrics and Gynecology

## 2022-04-22 DIAGNOSIS — R928 Other abnormal and inconclusive findings on diagnostic imaging of breast: Secondary | ICD-10-CM

## 2022-04-25 ENCOUNTER — Ambulatory Visit
Admission: RE | Admit: 2022-04-25 | Discharge: 2022-04-25 | Disposition: A | Discharge status: Home / Self Care | Payer: BC Managed Care – PPO | Source: Ambulatory Visit | Attending: Obstetrics and Gynecology | Admitting: Obstetrics and Gynecology

## 2022-04-25 ENCOUNTER — Other Ambulatory Visit: Payer: Self-pay | Admitting: Obstetrics and Gynecology

## 2022-04-25 DIAGNOSIS — R590 Localized enlarged lymph nodes: Secondary | ICD-10-CM | POA: Diagnosis not present

## 2022-04-25 DIAGNOSIS — R599 Enlarged lymph nodes, unspecified: Secondary | ICD-10-CM

## 2022-04-25 DIAGNOSIS — R928 Other abnormal and inconclusive findings on diagnostic imaging of breast: Secondary | ICD-10-CM

## 2022-04-30 ENCOUNTER — Ambulatory Visit
Admission: RE | Admit: 2022-04-30 | Discharge: 2022-04-30 | Disposition: A | Discharge status: Home / Self Care | Payer: BC Managed Care – PPO | Source: Ambulatory Visit | Attending: Obstetrics and Gynecology | Admitting: Obstetrics and Gynecology

## 2022-04-30 DIAGNOSIS — R590 Localized enlarged lymph nodes: Secondary | ICD-10-CM | POA: Diagnosis not present

## 2022-04-30 DIAGNOSIS — R599 Enlarged lymph nodes, unspecified: Secondary | ICD-10-CM

## 2022-05-07 DIAGNOSIS — R7309 Other abnormal glucose: Secondary | ICD-10-CM | POA: Diagnosis not present

## 2022-06-07 DIAGNOSIS — R7309 Other abnormal glucose: Secondary | ICD-10-CM | POA: Diagnosis not present

## 2022-06-20 DIAGNOSIS — U071 COVID-19: Secondary | ICD-10-CM | POA: Diagnosis not present

## 2022-06-20 DIAGNOSIS — R051 Acute cough: Secondary | ICD-10-CM | POA: Diagnosis not present

## 2022-07-03 ENCOUNTER — Ambulatory Visit (INDEPENDENT_AMBULATORY_CARE_PROVIDER_SITE_OTHER): Payer: BC Managed Care – PPO

## 2022-07-03 ENCOUNTER — Ambulatory Visit: Payer: BC Managed Care – PPO | Admitting: Podiatry

## 2022-07-03 DIAGNOSIS — M779 Enthesopathy, unspecified: Secondary | ICD-10-CM

## 2022-07-03 DIAGNOSIS — M19072 Primary osteoarthritis, left ankle and foot: Secondary | ICD-10-CM

## 2022-07-03 DIAGNOSIS — M19071 Primary osteoarthritis, right ankle and foot: Secondary | ICD-10-CM

## 2022-07-03 NOTE — Progress Notes (Signed)
Chief Complaint  Patient presents with   right ankle pain    Patient states that she has arthritis and flat feet.patient states that her right ankle has collapsed,left ankle hurts but not as bad as the right ankle.    HPI: 61 y.o. female presenting today as a new patient for second opinion regarding bilateral flatfeet and chronic arthritis and pain.  Patient states that she has seen multiple doctors in the past.  Initially she was seen by Dr. Sharol Given here locally in [place] about 10 years ago and was diagnosed with arthritis of the ankle joints.  At that time she did have bilateral ankle arthroscopies performed with minimal relief.  Most recently she states that she went to [facility] and was seen by orthopedic department.  Surgery was discussed however apparently the surgeon that she was assigned to was retiring in 3 months and she was lost to follow-up.  When she was seen by [facility] she was prescribed some custom ankle braces that she received here at [facility] but she states they were uncomfortable and not alleviating any of her symptoms and she never purchased them after they were made.  She presents for further treatment and evaluation regarding her chronic bilateral ankles  Past Medical History:  Diagnosis Date   Arthritis    Asthma    Bronchitis    Esophagitis    GERD (gastroesophageal reflux disease)    Hypertension    Obesity    PONV (postoperative nausea and vomiting)    nausea with the LEFT ankle surgery    Past Surgical History:  Procedure Laterality Date   ANKLE ARTHROSCOPY Left    left 2014/ right 2016   ANKLE ARTHROSCOPY Right 01/11/2015   Procedure: RIGHT ANKLE ARTHROSCOPY;  Surgeon: Newt Minion, MD;  Location: [facility];  Service: Orthopedics;  Laterality: Right;   BIOPSY BREAST Right    HYSTEROSCOPY WITH D & C N/A 09/05/2016   Procedure: DILATATION AND CURETTAGE /HYSTEROSCOPY;  Surgeon: Molli Posey, MD;  Location: [facility];  Service: Gynecology;   Laterality: N/A;   LAPAROSCOPIC GASTRIC SLEEVE RESECTION N/A 07/24/2020   Procedure: LAPAROSCOPIC GASTRIC SLEEVE RESECTION; REPAIR OF HIATAL HERNIA;  Surgeon: Alphonsa Overall, MD;  Location: [facility];  Service: General;  Laterality: N/A;   TOTAL KNEE ARTHROPLASTY Right 04/16/2017   Procedure: RIGHT TOTAL KNEE ARTHROPLASTY;  Surgeon: Newt Minion, MD;  Location: [facility];  Service: Orthopedics;  Laterality: Right;   UPPER GI ENDOSCOPY N/A 07/24/2020   Procedure: UPPER GI ENDOSCOPY;  Surgeon: Alphonsa Overall, MD;  Location: [facility];  Service: General;  Laterality: N/A;    Allergies  Allergen Reactions   Latex Itching     Physical Exam: General: The patient is alert and oriented x3 in no acute distress.  Dermatology: Skin is warm, dry and supple bilateral lower extremities. Negative for open lesions or macerations.  Vascular: Chronic heavy edema noted bilateral lower extremities.  No erythema.  Capillary refill immediate.  Neurological: Light touch and protective threshold grossly intact  Musculoskeletal Exam: Pes planovalgus deformity noted to the bilateral feet which are somewhat rigid and nonreducible.  There is limited range of motion also to the ankle joint.  Radiographic Exam B/L ankles 07/03/2022:  No acute fractures identified.  Advanced degenerative changes noted throughout the subtalar and tarsal joints as well as the ankle joint.  There is periarticular spurring with bone spur formation to the anterior and posterior aspect of the ankle joint bilateral  Assessment: 1.  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  Pes planovalgus deformity bilateral   Plan of Care:  1. Patient evaluated. X-Rays reviewed.  2.  The patient already has an established history with [facility].  The patient is considering surgical intervention at this time.  Reca mended to the patient that she returns to [facility] for follow-up appointment and for possible surgical consultation and discussion.  She  would need staged procedures which would likely include pantalar arthrodesis which would be high risk and involve long-term postoperative recovery. 3.  Return to clinic as needed      Edrick Kins, DPM [facility]  Dr. Edrick Kins, DPM    [address], [place] [address]                Office [phone]  Fax [phone]

## 2022-07-07 DIAGNOSIS — R7309 Other abnormal glucose: Secondary | ICD-10-CM | POA: Diagnosis not present

## 2022-07-11 ENCOUNTER — Other Ambulatory Visit: Payer: Self-pay | Admitting: Gastroenterology

## 2022-07-20 DIAGNOSIS — Z23 Encounter for immunization: Secondary | ICD-10-CM | POA: Diagnosis not present

## 2022-07-30 ENCOUNTER — Encounter (HOSPITAL_COMMUNITY): Payer: Self-pay | Admitting: Gastroenterology

## 2022-07-30 DIAGNOSIS — Z903 Acquired absence of stomach [part of]: Secondary | ICD-10-CM | POA: Diagnosis not present

## 2022-07-30 NOTE — Progress Notes (Signed)
Attempted to obtain medical history via telephone, unable to reach at this time. HIPAA compliant voicemail message left requesting return call to pre surgical testing department. 

## 2022-08-06 ENCOUNTER — Encounter (HOSPITAL_COMMUNITY): Payer: Self-pay | Admitting: Gastroenterology

## 2022-08-06 ENCOUNTER — Other Ambulatory Visit: Payer: Self-pay

## 2022-08-06 ENCOUNTER — Ambulatory Visit (HOSPITAL_COMMUNITY)
Admission: RE | Admit: 2022-08-06 | Discharge: 2022-08-06 | Disposition: A | Discharge status: Home / Self Care | Payer: BC Managed Care – PPO | Attending: Gastroenterology | Admitting: Gastroenterology

## 2022-08-06 ENCOUNTER — Encounter (HOSPITAL_COMMUNITY)
Admission: RE | Disposition: A | Discharge status: Home / Self Care | Payer: Self-pay | Source: Home / Self Care | Attending: Gastroenterology

## 2022-08-06 ENCOUNTER — Ambulatory Visit (HOSPITAL_COMMUNITY): Payer: BC Managed Care – PPO | Admitting: Certified Registered Nurse Anesthetist

## 2022-08-06 DIAGNOSIS — D128 Benign neoplasm of rectum: Secondary | ICD-10-CM | POA: Insufficient documentation

## 2022-08-06 DIAGNOSIS — Z6841 Body Mass Index (BMI) 40.0 and over, adult: Secondary | ICD-10-CM | POA: Insufficient documentation

## 2022-08-06 DIAGNOSIS — J45909 Unspecified asthma, uncomplicated: Secondary | ICD-10-CM | POA: Diagnosis not present

## 2022-08-06 DIAGNOSIS — K640 First degree hemorrhoids: Secondary | ICD-10-CM | POA: Insufficient documentation

## 2022-08-06 DIAGNOSIS — K635 Polyp of colon: Secondary | ICD-10-CM | POA: Diagnosis not present

## 2022-08-06 DIAGNOSIS — K219 Gastro-esophageal reflux disease without esophagitis: Secondary | ICD-10-CM | POA: Insufficient documentation

## 2022-08-06 DIAGNOSIS — Z87891 Personal history of nicotine dependence: Secondary | ICD-10-CM | POA: Diagnosis not present

## 2022-08-06 DIAGNOSIS — D124 Benign neoplasm of descending colon: Secondary | ICD-10-CM | POA: Insufficient documentation

## 2022-08-06 DIAGNOSIS — Z1211 Encounter for screening for malignant neoplasm of colon: Secondary | ICD-10-CM | POA: Diagnosis not present

## 2022-08-06 DIAGNOSIS — E669 Obesity, unspecified: Secondary | ICD-10-CM | POA: Insufficient documentation

## 2022-08-06 DIAGNOSIS — D175 Benign lipomatous neoplasm of intra-abdominal organs: Secondary | ICD-10-CM | POA: Diagnosis not present

## 2022-08-06 DIAGNOSIS — K621 Rectal polyp: Secondary | ICD-10-CM | POA: Diagnosis not present

## 2022-08-06 DIAGNOSIS — I10 Essential (primary) hypertension: Secondary | ICD-10-CM | POA: Insufficient documentation

## 2022-08-06 HISTORY — PX: COLONOSCOPY WITH PROPOFOL: SHX5780

## 2022-08-06 HISTORY — PX: POLYPECTOMY: SHX5525

## 2022-08-06 HISTORY — PX: BIOPSY: SHX5522

## 2022-08-06 SURGERY — COLONOSCOPY WITH PROPOFOL
Anesthesia: Monitor Anesthesia Care

## 2022-08-06 MED ORDER — LACTATED RINGERS IV SOLN: INTRAVENOUS | Status: DC | PRN

## 2022-08-06 MED ORDER — PROPOFOL 10 MG/ML IV BOLUS
INTRAVENOUS | Status: DC | PRN
  Administered 2022-08-06: 50 mg via INTRAVENOUS
  Administered 2022-08-06: 30 mg via INTRAVENOUS
  Administered 2022-08-06: 20 mg via INTRAVENOUS
  Administered 2022-08-06: 30 mg via INTRAVENOUS
  Administered 2022-08-06: 20 mg via INTRAVENOUS

## 2022-08-06 MED ORDER — LACTATED RINGERS IV SOLN
INTRAVENOUS | Status: AC | PRN
  Administered 2022-08-06: 1000 mL via INTRAVENOUS

## 2022-08-06 MED ORDER — PROPOFOL 500 MG/50ML IV EMUL
INTRAVENOUS | Status: DC | PRN
  Administered 2022-08-06: 100 ug/kg/min via INTRAVENOUS

## 2022-08-06 MED ORDER — PHENYLEPHRINE 80 MCG/ML (10ML) SYRINGE FOR IV PUSH (FOR BLOOD PRESSURE SUPPORT)
PREFILLED_SYRINGE | INTRAVENOUS | Status: DC | PRN
  Administered 2022-08-06: 80 ug via INTRAVENOUS

## 2022-08-06 MED ORDER — ONDANSETRON HCL 4 MG/2ML IJ SOLN
INTRAMUSCULAR | Status: DC | PRN
  Administered 2022-08-06: 4 mg via INTRAVENOUS

## 2022-08-06 MED ORDER — SODIUM CHLORIDE 0.9 % IV SOLN: INTRAVENOUS | Status: DC

## 2022-08-06 MED ORDER — DEXMEDETOMIDINE HCL IN NACL 80 MCG/20ML IV SOLN
INTRAVENOUS | Status: DC | PRN
  Administered 2022-08-06 (×2): 4 ug via BUCCAL

## 2022-08-06 MED ORDER — PROPOFOL 1000 MG/100ML IV EMUL
INTRAVENOUS | Status: AC
  Filled 2022-08-06: qty 100

## 2022-08-06 SURGICAL SUPPLY — 22 items

## 2022-08-06 NOTE — Anesthesia Postprocedure Evaluation (Signed)
Anesthesia Post Note  Patient: Abbie Berling  Procedure(s) Performed: COLONOSCOPY WITH PROPOFOL BIOPSY POLYPECTOMY HOT HEMOSTASIS (ARGON PLASMA COAGULATION/BICAP)     Patient location during evaluation: PACU Anesthesia Type: MAC Level of consciousness: awake and alert Pain management: pain level controlled Vital Signs Assessment: post-procedure vital signs reviewed and stable Respiratory status: spontaneous breathing, nonlabored ventilation, respiratory function stable and patient connected to nasal cannula oxygen Cardiovascular status: stable and blood pressure returned to baseline Postop Assessment: no apparent nausea or vomiting Anesthetic complications: no   No notable events documented.  Last Vitals:  Vitals:   08/06/22 0929 08/06/22 0939  BP: 125/66 124/65  Pulse: 71 67  Resp: 20 17  Temp:    SpO2: 100% 97%    Last Pain:  Vitals:   08/06/22 0939  TempSrc:   PainSc: 0-No pain                 Effie Berkshire

## 2022-08-06 NOTE — Discharge Instructions (Signed)

## 2022-08-06 NOTE — Anesthesia Preprocedure Evaluation (Signed)
Anesthesia Evaluation  Patient identified by MRN, date of birth, ID band Patient awake    Reviewed: Allergy & Precautions, NPO status , Patient's Chart, lab work & pertinent test results  History of Anesthesia Complications (+) PONV and history of anesthetic complications  Airway Mallampati: II  TM Distance: >3 FB Neck ROM: Full    Dental  (+) Teeth Intact, Dental Advisory Given   Pulmonary asthma , former smoker,    breath sounds clear to auscultation       Cardiovascular hypertension, Pt. on medications  Rhythm:Regular Rate:Normal     Neuro/Psych negative neurological ROS  negative psych ROS   GI/Hepatic Neg liver ROS, GERD  ,  Endo/Other  negative endocrine ROS  Renal/GU negative Renal ROS     Musculoskeletal  (+) Arthritis ,   Abdominal (+) + obese,   Peds  Hematology negative hematology ROS (+)   Anesthesia Other Findings   Reproductive/Obstetrics                             Anesthesia Physical Anesthesia Plan  ASA: 3  Anesthesia Plan: MAC   Post-op Pain Management:    Induction: Intravenous  PONV Risk Score and Plan: 0 and Propofol infusion  Airway Management Planned: Simple Face Mask and Natural Airway  Additional Equipment: None  Intra-op Plan:   Post-operative Plan:   Informed Consent: I have reviewed the patients History and Physical, chart, labs and discussed the procedure including the risks, benefits and alternatives for the proposed anesthesia with the patient or authorized representative who has indicated his/her understanding and acceptance.       Plan Discussed with: CRNA  Anesthesia Plan Comments:         Anesthesia Quick Evaluation

## 2022-08-06 NOTE — Interval H&P Note (Signed)
History and Physical Interval Note:  08/06/2022 8:16 AM  Sarah Dodson  has presented today for surgery, with the diagnosis of history of colon polyps.  The various methods of treatment have been discussed with the patient and family. After consideration of risks, benefits and other options for treatment, the patient has consented to  Procedure(s): COLONOSCOPY WITH PROPOFOL (N/A) as a surgical intervention.  The patient's history has been reviewed, patient examined, no change in status, stable for surgery.  I have reviewed the patient's chart and labs.  Questions were answered to the patient's satisfaction.     [name]

## 2022-08-06 NOTE — Transfer of Care (Signed)
Immediate Anesthesia Transfer of Care Note  Patient: Sarah Dodson  Procedure(s) Performed: Procedure(s): COLONOSCOPY WITH PROPOFOL (N/A) BIOPSY POLYPECTOMY HOT HEMOSTASIS (ARGON PLASMA COAGULATION/BICAP) (N/A)  Patient Location: PACU  Anesthesia Type:mac  Level of Consciousness: Patient easily awoken, sedated, comfortable, cooperative, following commands, responds to stimulation.   Airway & Oxygen Therapy: Patient spontaneously breathing, ventilating well, oxygen via simple oxygen mask.  Post-op Assessment: Report given to PACU RN, vital signs reviewed and stable, moving all extremities.   Post vital signs: Reviewed and stable.  Complications: No apparent anesthesia complications Last Vitals:  Vitals Value Taken Time  BP 115/71 08/06/22 0920  Temp 36.6 C 08/06/22 0919  Pulse 71 08/06/22 0920  Resp 20 08/06/22 0920  SpO2 100 % 08/06/22 0920  Vitals shown include unvalidated device data.  Last Pain:  Vitals:   08/06/22 0919  TempSrc: Tympanic  PainSc: Asleep         Complications: No notable events documented.

## 2022-08-06 NOTE — H&P (Signed)
Date of Initial H&P: 07/11/22  History reviewed, patient examined, no change in status, stable for surgery.

## 2022-08-06 NOTE — Anesthesia Procedure Notes (Signed)
Procedure Name: LMA Insertion Date/Time: 08/06/2022 8:26 AM  Performed by: Deliah Boston, CRNAPre-anesthesia Checklist: Patient identified, Emergency Drugs available, Suction available and Patient being monitored Patient Re-evaluated:Patient Re-evaluated prior to induction Oxygen Delivery Method: Simple face mask Placement Confirmation: positive ETCO2 and breath sounds checked- equal and bilateral

## 2022-08-06 NOTE — Op Note (Signed)
[facility] Patient Name: Sarah Dodson Procedure Date: 08/06/2022 MRN: [id number] Attending MD: [name] , MD, [id number] Date of Birth: 04-Mar-1961 CSN: [id number] Age: 61 Admit Type: Outpatient Procedure:                Colonoscopy Indications:              Screening for colorectal malignant neoplasm, Last                            colonoscopy: December 2011 Providers:                [name], MD, Orvil Feil, RN, Brien Mates, RNFA, William Dalton, Technician Referring MD:              Medicines:                Propofol per Anesthesia, Monitored Anesthesia Care Complications:            No immediate complications. Estimated Blood Loss:     Estimated blood loss was minimal. Procedure:                Pre-Anesthesia Assessment:                           - Prior to the procedure, a History and Physical                            was performed, and patient medications and                            allergies were reviewed. The patient's tolerance of                            previous anesthesia was also reviewed. The risks                            and benefits of the procedure and the sedation                            options and risks were discussed with the patient.                            All questions were answered, and informed consent                            was obtained. Prior Anticoagulants: The patient has                            taken no anticoagulant or antiplatelet agents. ASA                            Grade Assessment: III - A patient with severe  systemic disease. After reviewing the risks and                            benefits, the patient was deemed in satisfactory                            condition to undergo the procedure.                           After obtaining informed consent, the colonoscope                            was passed under direct  vision. Throughout the                            procedure, the patient's blood pressure, pulse, and                            oxygen saturations were monitored continuously. The                            [id number]-HQ190L ([id number] Olympus colonoscope was                            introduced through the anus and advanced to the the                            cecum, identified by appendiceal orifice and                            ileocecal valve. The colonoscopy was extremely                            difficult due to significant looping, a tortuous                            colon and fair prep. Successful completion of the                            procedure was aided by changing the patient to a                            supine position, straightening and shortening the                            scope to obtain bowel loop reduction, using scope                            torsion, applying abdominal pressure and lavage.                            The patient tolerated the procedure fairly well.  The quality of the bowel preparation was fair. The                            ileocecal valve, appendiceal orifice, and rectum                            were photographed. Scope In: 8:32:24 AM Scope Out: 9:13:24 AM Scope Withdrawal Time: 0 hours 16 minutes 38 seconds  Total Procedure Duration: 0 hours 41 minutes 0 seconds  Findings:      The perianal and digital rectal examinations were normal.      Two flat and semi-sessile polyps were found in the descending colon. The       polyps were 7 to 10 mm in size. These polyps were removed with a hot       snare. Resection and retrieval were complete. Estimated blood loss: none.      Two flat and semi-sessile polyps were found in the descending colon. The       polyps were 1 to 4 mm in size. These polyps were removed with a cold       biopsy forceps. Resection and retrieval were complete. Estimated blood       loss was  minimal.      A 5 mm polyp was found in the rectum. The polyp was sessile. The polyp       was removed with a hot snare. Resection and retrieval were complete.       Estimated blood loss: none.      There was a medium-sized lipoma, in the descending colon. Biopsies were       taken with a cold forceps for histology. Estimated blood loss was       minimal.      Internal hemorrhoids were found during retroflexion. The hemorrhoids       were medium-sized and Grade I (internal hemorrhoids that do not       prolapse). Impression:               - Preparation of the colon was fair.                           - Two 7 to 10 mm polyps in the descending colon,                            removed with a hot snare. Resected and retrieved.                           - Two 1 to 4 mm polyps in the descending colon,                            removed with a cold biopsy forceps. Resected and                            retrieved.                           - One 5 mm polyp in the rectum, removed with a hot  snare. Resected and retrieved.                           - Medium-sized lipoma in the descending colon.                            Biopsied.                           - Internal hemorrhoids. Moderate Sedation:      N/A - MAC procedure Recommendation:           - Patient has a contact number available for                            emergencies. The signs and symptoms of potential                            delayed complications were discussed with the                            patient. Return to normal activities tomorrow.                            Written discharge instructions were provided to the                            patient.                           - High fiber diet.                           - Await pathology results.                           - Repeat colonoscopy for surveillance based on                            pathology results.                           -  No aspirin, ibuprofen, naproxen, or other                            non-steroidal anti-inflammatory drugs for 1 week. Procedure Code(s):        --- Professional ---                           [phone], Colonoscopy, flexible; with removal of                            tumor(s), polyp(s), or other lesion(s) by snare                            technique  23762, 27, Colonoscopy, flexible; with biopsy,                            single or multiple Diagnosis Code(s):        --- Professional ---                           Z12.11, Encounter for screening for malignant                            neoplasm of colon                           D12.8, Benign neoplasm of rectum                           D12.4, Benign neoplasm of descending colon                           K64.0, First degree hemorrhoids                           D17.5, Benign lipomatous neoplasm of                            intra-abdominal organs CPT copyright 2022 American Medical Association. All rights reserved. The codes documented in this report are preliminary and upon coder review may  be revised to meet current compliance requirements. [name], MD 08/06/2022 9:28:41 AM This report has been signed electronically. Number of Addenda: 0

## 2022-08-07 ENCOUNTER — Encounter (HOSPITAL_COMMUNITY): Payer: Self-pay | Admitting: Gastroenterology

## 2022-08-07 DIAGNOSIS — R7309 Other abnormal glucose: Secondary | ICD-10-CM | POA: Diagnosis not present

## 2022-08-07 LAB — SURGICAL PATHOLOGY

## 2022-09-06 DIAGNOSIS — E7800 Pure hypercholesterolemia, unspecified: Secondary | ICD-10-CM | POA: Diagnosis not present

## 2022-09-06 DIAGNOSIS — Z79899 Other long term (current) drug therapy: Secondary | ICD-10-CM | POA: Diagnosis not present

## 2022-09-06 DIAGNOSIS — I10 Essential (primary) hypertension: Secondary | ICD-10-CM | POA: Diagnosis not present

## 2022-09-06 DIAGNOSIS — Z0000 Encounter for general adult medical examination without abnormal findings: Secondary | ICD-10-CM | POA: Diagnosis not present

## 2022-09-06 DIAGNOSIS — R7309 Other abnormal glucose: Secondary | ICD-10-CM | POA: Diagnosis not present

## 2022-09-06 DIAGNOSIS — K912 Postsurgical malabsorption, not elsewhere classified: Secondary | ICD-10-CM | POA: Diagnosis not present

## 2022-09-06 DIAGNOSIS — J4530 Mild persistent asthma, uncomplicated: Secondary | ICD-10-CM | POA: Diagnosis not present

## 2022-10-07 DIAGNOSIS — R7309 Other abnormal glucose: Secondary | ICD-10-CM | POA: Diagnosis not present

## 2022-11-07 DIAGNOSIS — R7309 Other abnormal glucose: Secondary | ICD-10-CM | POA: Diagnosis not present

## 2022-11-27 ENCOUNTER — Encounter (INDEPENDENT_AMBULATORY_CARE_PROVIDER_SITE_OTHER): Payer: Self-pay | Admitting: Family Medicine

## 2022-11-27 ENCOUNTER — Ambulatory Visit (INDEPENDENT_AMBULATORY_CARE_PROVIDER_SITE_OTHER): Payer: BC Managed Care – PPO | Admitting: Family Medicine

## 2022-11-27 VITALS — BP 148/81 | HR 77 | Temp 98.3°F | Ht 60.0 in | Wt 300.0 lb

## 2022-11-27 DIAGNOSIS — E65 Localized adiposity: Secondary | ICD-10-CM

## 2022-11-27 DIAGNOSIS — Z6841 Body Mass Index (BMI) 40.0 and over, adult: Secondary | ICD-10-CM | POA: Diagnosis not present

## 2022-11-27 DIAGNOSIS — Z0289 Encounter for other administrative examinations: Secondary | ICD-10-CM

## 2022-11-27 DIAGNOSIS — Z9884 Bariatric surgery status: Secondary | ICD-10-CM | POA: Insufficient documentation

## 2022-11-27 DIAGNOSIS — I10 Essential (primary) hypertension: Secondary | ICD-10-CM | POA: Diagnosis not present

## 2022-11-27 NOTE — Assessment & Plan Note (Signed)
Blood pressure is elevated today She is taking HCTZ 25 mg each morning  Look for improvements in blood pressure with weight reduction.  Begin reducing intake of high sodium foods.  Encouraged monitoring home blood pressure readings 1-2 times a week.

## 2022-11-27 NOTE — Assessment & Plan Note (Signed)
Reviewed patient's vertical sleeve gastrectomy history done in 2021 with preop weight 322 and postop nadir weight 237.  She hopes to get down to 180 pounds but started regaining weight after she started overeating and making poor food choices.  She never really adopted a regular exercise pattern.  She still has some restriction with food volumes at mealtime.  She is inconsistent with taking any vitamins.  Will plan to work on small meals including lean protein at each meal, reducing her added sugar intake and finding ways to increase physical activity.  Encouraged a multivitamin daily and will include annual bariatric labs with her next set of blood work.

## 2022-11-27 NOTE — Progress Notes (Signed)
Office: [phone]  /  Fax: [phone]   Initial Visit  Sarah Dodson was seen in clinic today to evaluate for obesity. She is interested in losing weight to improve overall health and reduce the risk of weight related complications. She presents today to review program treatment options, initial physical assessment, and evaluation.     She was referred by: Specialist  When asked what else they would like to accomplish? She states: Adopt healthier eating patterns, Improve energy levels and physical activity, and Improve quality of life  Weight history:   Had VSG at [place] Oct 2021 322 lb--> 273 lb nadir weight Sleeve pouch does give her some restriction Denies vomiting but is eating past fullness.  Take PPI for reflux Happy weight:  180 lb  Works as a Insurance account manager at [employer] When asked how has your weight affected you? She states: Contributed to medical problems, Contributed to orthopedic problems or mobility issues, Having fatigue, and Having poor endurance  Some associated conditions: Hypertension, Arthritis:R ankle, back, had R TKR, and GERD  Contributing factors: Family history, Nutritional, Stress, and Reduced physical activity  Weight promoting medications identified: None  Current nutrition plan: None  Current level of physical activity: NEAT [facility] doing weights and seated elliptical or pool  Current or previous pharmacotherapy: Phentermine  Response to medication: Ineffective so it was discontinued   Past medical history includes:   Past Medical History:  Diagnosis Date   Arthritis    Asthma    Bronchitis    Esophagitis    GERD (gastroesophageal reflux disease)    Hypertension    Obesity    PONV (postoperative nausea and vomiting)    nausea with the LEFT ankle surgery     Objective:   BP (!) 148/81   Pulse 77   Temp 98.3 F (36.8 C)   Ht 5' (1.524 m)   Wt 300 lb (136.1 kg)   SpO2 98%   BMI 58.59 kg/m  She was weighed on the  bioimpedance scale: Body mass index is 58.59 kg/m.  Peak Weight:300 lb , Body Fat%:65, Visceral Fat Rating:29, Weight trend over the last 12 months: Increasing  General:  Alert, oriented and cooperative. Patient is in no acute distress.  Respiratory: Normal respiratory effort, no problems with respiration noted  Extremities: Normal range of motion.    Mental Status: Normal mood and affect. Normal behavior. Normal judgment and thought content.   DIAGNOSTIC DATA REVIEWED:  BMET    Component Value Date/Time   NA 138 07/18/2020 1451   K 3.4 (L) 07/18/2020 1451   CL 99 07/18/2020 1451   CO2 26 07/18/2020 1451   GLUCOSE 84 07/18/2020 1451   BUN 22 (H) 07/18/2020 1451   CREATININE 0.87 07/18/2020 1451   CALCIUM 9.7 07/18/2020 1451   GFRNONAA >60 07/18/2020 1451   GFRAA >60 04/04/2017 1413   No results found for: "HGBA1C" No results found for: "INSULIN" CBC    Component Value Date/Time   WBC 11.0 (H) 07/25/2020 0447   RBC 4.41 07/25/2020 0447   HGB 12.5 07/25/2020 0447   HCT 38.0 07/25/2020 0447   PLT 380 07/25/2020 0447   MCV 86.2 07/25/2020 0447   MCH 28.3 07/25/2020 0447   MCHC 32.9 07/25/2020 0447   RDW 13.7 07/25/2020 0447   Iron/TIBC/Ferritin/ %Sat No results found for: "IRON", "TIBC", "FERRITIN", "IRONPCTSAT" Lipid Panel  No results found for: "CHOL", "TRIG", "HDL", "CHOLHDL", "VLDL", "LDLCALC", "LDLDIRECT" Hepatic Function Panel  Component Value Date/Time   PROT 7.8 07/18/2020 1451   ALBUMIN 4.1 07/18/2020 1451   AST 25 07/18/2020 1451   ALT 28 07/18/2020 1451   ALKPHOS 69 07/18/2020 1451   BILITOT 0.7 07/18/2020 1451   No results found for: "TSH"   Assessment and Plan:   S/P laparoscopic sleeve gastrectomy Assessment & Plan: Reviewed patient's vertical sleeve gastrectomy history done in 2021 with preop weight 322 and postop nadir weight 237.  She hopes to get down to 180 pounds but started regaining weight after she started overeating and making poor  food choices.  She never really adopted a regular exercise pattern.  She still has some restriction with food volumes at mealtime.  She is inconsistent with taking any vitamins.  Will plan to work on small meals including lean protein at each meal, reducing her added sugar intake and finding ways to increase physical activity.  Encouraged a multivitamin daily and will include annual bariatric labs with her next set of blood work.   Morbid obesity ([place])  BMI 50.0-59.9, adult ([place])  Primary hypertension Assessment & Plan: Blood pressure is elevated today She is taking HCTZ 25 mg each morning  Look for improvements in blood pressure with weight reduction.  Begin reducing intake of high sodium foods.  Encouraged monitoring home blood pressure readings 1-2 times a week.   Central adiposity Assessment & Plan: Reviewed bioimpedance results together today.  She has a high visceral fat rating of 29 with a goal of under 10.  We discussed how carrying a high visceral fat amount increases her risk for heart disease, fatty liver disease and diabetes.         Obesity Treatment / Action Plan:  Patient will work on garnering support from family and friends to begin weight loss journey. Will work on eliminating or reducing the presence of highly palatable, calorie dense foods in the home. Will complete provided nutritional and psychosocial assessment questionnaire before the next appointment. Will be scheduled for indirect calorimetry to determine resting energy expenditure in a fasting state.  This will allow [place] to create a reduced calorie, high-protein meal plan to promote loss of fat mass while preserving muscle mass. Will think about ideas on how to incorporate physical activity into their daily routine. Will work on reducing intake of added sugars, simple sugars and processed carbs. Will reduce liquid calories and sugary drinks from diet. Will work on improving sleep hygiene and trying to  obtain at least 7 hours of sleep. Was counseled on nutritional approaches to weight loss and benefits of complex carbs and high quality protein as part of nutritional weight management. Was counseled on pharmacotherapy and role as an adjunct in weight management.   Obesity Education Performed Today:  She was weighed on the bioimpedance scale and results were discussed and documented in the synopsis.  We discussed obesity as a disease and the importance of a more detailed evaluation of all the factors contributing to the disease.  We discussed the importance of long term lifestyle changes which include nutrition, exercise and behavioral modifications as well as the importance of customizing this to her specific health and social needs.  We discussed the benefits of reaching a healthier weight to alleviate the symptoms of existing conditions and reduce the risks of the biomechanical, metabolic and psychological effects of obesity.  Tonia Ghent Libby appears to be in the action stage of change and states they are ready to start intensive lifestyle modifications and behavioral modifications.  [place]  minutes was spent today on this visit including the above counseling, pre-visit chart review, and post-visit documentation.  Reviewed by clinician on day of visit: allergies, medications, problem list, medical history, surgical history, family history, social history, and previous encounter notes.  I have reviewed the above documentation for accuracy and completeness, and I agree with the above. Dell Ponto, DO

## 2022-11-27 NOTE — Assessment & Plan Note (Signed)
Reviewed bioimpedance results together today.  She has a high visceral fat rating of 29 with a goal of under 10.  We discussed how carrying a high visceral fat amount increases her risk for heart disease, fatty liver disease and diabetes.

## 2022-12-06 DIAGNOSIS — R7309 Other abnormal glucose: Secondary | ICD-10-CM | POA: Diagnosis not present

## 2023-01-06 DIAGNOSIS — R7309 Other abnormal glucose: Secondary | ICD-10-CM | POA: Diagnosis not present

## 2023-01-08 ENCOUNTER — Encounter (INDEPENDENT_AMBULATORY_CARE_PROVIDER_SITE_OTHER): Payer: Self-pay | Admitting: Family Medicine

## 2023-01-08 ENCOUNTER — Ambulatory Visit (INDEPENDENT_AMBULATORY_CARE_PROVIDER_SITE_OTHER): Payer: BC Managed Care – PPO | Admitting: Family Medicine

## 2023-01-08 VITALS — BP 127/72 | HR 61 | Temp 98.5°F | Ht 60.0 in | Wt 305.0 lb

## 2023-01-08 DIAGNOSIS — M158 Other polyosteoarthritis: Secondary | ICD-10-CM

## 2023-01-08 DIAGNOSIS — I10 Essential (primary) hypertension: Secondary | ICD-10-CM | POA: Diagnosis not present

## 2023-01-08 DIAGNOSIS — K912 Postsurgical malabsorption, not elsewhere classified: Secondary | ICD-10-CM | POA: Diagnosis not present

## 2023-01-08 DIAGNOSIS — R0602 Shortness of breath: Secondary | ICD-10-CM

## 2023-01-08 DIAGNOSIS — F3289 Other specified depressive episodes: Secondary | ICD-10-CM | POA: Diagnosis not present

## 2023-01-08 DIAGNOSIS — R5383 Other fatigue: Secondary | ICD-10-CM | POA: Diagnosis not present

## 2023-01-08 DIAGNOSIS — Z6841 Body Mass Index (BMI) 40.0 and over, adult: Secondary | ICD-10-CM

## 2023-01-08 DIAGNOSIS — Z98818 Other dental procedure status: Secondary | ICD-10-CM | POA: Diagnosis not present

## 2023-01-08 DIAGNOSIS — Z9884 Bariatric surgery status: Secondary | ICD-10-CM | POA: Diagnosis not present

## 2023-01-08 DIAGNOSIS — Z1331 Encounter for screening for depression: Secondary | ICD-10-CM

## 2023-01-08 NOTE — Progress Notes (Signed)
Chief Complaint:   OBESITY Sarah Dodson (MR# [id number]) is a 62 y.o. female who presents for evaluation and treatment of obesity and related comorbidities. Current BMI is Body mass index is 59.57 kg/m. Sarah Dodson has been struggling with her weight for many years and has been unsuccessful in either losing weight, maintaining weight loss, or reaching her healthy weight goal.  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  She lives alone.  She attends water exercises 3 times per week.  She is a stress eater.  Time is an obstacle for health meals.    Sarah Dodson is currently in the action stage of change and ready to dedicate time achieving and maintaining a healthier weight. Sarah Dodson is interested in becoming our patient and working on intensive lifestyle modifications including (but not limited to) diet and exercise for weight loss.  Sarah Dodson's habits were reviewed today and are as follows: her desired weight loss is 125 lbs, she has been heavy most of her life, she started gaining weight at 63-12 years of age, her heaviest weight ever was 350 pounds, she has significant food cravings issues, she skips meals frequently, she is frequently drinking liquids with calories, she frequently makes poor food choices, she frequently eats larger portions than normal, and she struggles with emotional eating.  Depression Screen Sarah Dodson's Food and Mood (modified PHQ-9) score was 20.  Subjective:   1. Other fatigue Sarah Dodson admits to daytime somnolence and admits to waking up still tired. Patient has a history of symptoms of daytime fatigue and morning fatigue. Sarah Dodson generally gets 6 or 7 hours of sleep per night, and states that she has nightime awakenings and generally restful sleep. Snoring is present. Apneic episodes are present. Epworth Sleepiness Score is 8. EKG, NSR today.   2. SOBOE (shortness of breath on exertion) Sarah Dodson notes increasing shortness of breath with exercising and seems to be worsening  over time with weight gain. She notes getting out of breath sooner with activity than she used to. This has not gotten worse recently. Sarah Dodson denies shortness of breath at rest or orthopnea.  3. Essential hypertension Blood pressure well controlled on HCTZ 12.5 mg daily. Patient denies headaches or chest pains.   4. S/P laparoscopic sleeve gastrectomy Done at [place] 07/2020.  Pre-op weight 322 lbs, Nadir weight 273 lbs.  Has volume restriction at meals.  Tends to over snack and makes poor food choices.   5. Other osteoarthritis involving multiple joints Patient has arthritis in her ankles and knees has been a barrier to exercise.  S/p right total knee replacement 2019.  Lack of walking makes it harder for her to lose weight.   6. 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  Takes a multivitamin daily.  Has not needed IV iron or B12.    7. Other depression, with emotional eating Bariatric PHQ 9:20.  Stress levels have been high.   Assessment/Plan:   1. Other fatigue Keishia does feel that her weight is causing her energy to be lower than it should be. Fatigue may be related to obesity, depression or many other causes. Labs will be ordered, and in the meanwhile, Lira will focus on self care including making healthy food choices, increasing physical activity and focusing on stress reduction. Update labs today.   - EKG 12-Lead - Vitamin B12 - CBC with Differential/Platelet - Comprehensive metabolic panel - Folate - Hemoglobin A1c - Insulin, random - Lipid Panel With LDL/HDL Ratio - VITAMIN D 25 Hydroxy (Vit-D Deficiency, Fractures) -  TSH - T4, free  2. SOBOE (shortness of breath on exertion) Sarah Dodson does feel that she gets out of breath more easily that she used to when she exercises. Sarah Dodson's shortness of breath appears to be obesity related and exercise induced. She has agreed to work on weight loss and gradually increase exercise to treat her exercise induced shortness of  breath. Will continue to monitor closely.  3. Essential hypertension Look for blood pressure improvements with weight loss.  Check labs today.   - Comprehensive metabolic panel  4. S/P laparoscopic sleeve gastrectomy Continue a multivitamin daily.  Patient will work on increasing exercise frequency.  5. Other osteoarthritis involving multiple joints Patient plans to get to the gym or pool on days off of work.   6. Postoperative intestinal malabsorption Continue multivitamin daily. Check labs today.   - Prealbumin - Ferritin - Iron and TIBC  7. Other depression, with emotional eating Patient will work on stress reduction and mindful eating.  Consider CBT and or Wellbutrin.   8. Depression screening [name] had a positive depression screening. Depression is commonly associated with obesity and often results in emotional eating behaviors. We will monitor this closely and work on CBT to help improve the non-hunger eating patterns. Referral to Psychology may be required if no improvement is seen as she continues in our clinic.  9. Class 3 severe obesity with serious comorbidity and body mass index (BMI) of 50.0 to 59.9 in adult, unspecified obesity type Sarah Dodson is currently in the action stage of change and her goal is to continue with weight loss efforts. I recommend Sarah Dodson begin the structured treatment plan as follows:  She has agreed to the Category 3 Plan + 100 calories.   Exercise goals:  Gym 2 times per week.     Behavioral modification strategies: increasing lean protein intake and planning for success.  She was informed of the importance of frequent follow-up visits to maximize her success with intensive lifestyle modifications for her multiple health conditions. She was informed we would discuss her lab results at her next visit unless there is a critical issue that needs to be addressed sooner. Sarah Dodson agreed to keep her next visit at the agreed upon time to discuss these  results.  Objective:   Blood pressure 127/72, pulse 61, temperature 98.5 F (36.9 C), height 5' (1.524 m), weight (!) 305 lb (138.3 kg), SpO2 100 %. Body mass index is 59.57 kg/m.  EKG: Normal sinus rhythm, rate 61 bpm.  Indirect Calorimeter completed today shows a VO2 of 271 and a REE of 1872.  Her calculated basal metabolic rate is 1610 thus her basal metabolic rate is better than expected.  General: Cooperative, alert, well developed, in no acute distress. HEENT: Conjunctivae and lids unremarkable. Cardiovascular: Regular rhythm.  Lungs: Normal work of breathing. Neurologic: No focal deficits.   Lab Results  Component Value Date   CREATININE 0.70 01/08/2023   BUN 14 01/08/2023   NA 142 01/08/2023   K 4.7 01/08/2023   CL 102 01/08/2023   CO2 24 01/08/2023   Lab Results  Component Value Date   ALT 19 01/08/2023   AST 19 01/08/2023   ALKPHOS 105 01/08/2023   BILITOT 0.4 01/08/2023   Lab Results  Component Value Date   HGBA1C 5.8 (H) 01/08/2023   Lab Results  Component Value Date   INSULIN 15.4 01/08/2023   Lab Results  Component Value Date   TSH 2.410 01/08/2023   Lab Results  Component Value Date  CHOL 191 01/08/2023   HDL 59 01/08/2023   LDLCALC 114 (H) 01/08/2023   TRIG 98 01/08/2023   Lab Results  Component Value Date   WBC 7.2 01/08/2023   HGB 14.2 01/08/2023   HCT 43.3 01/08/2023   MCV 87 01/08/2023   PLT 356 01/08/2023   Lab Results  Component Value Date   IRON 82 01/08/2023   TIBC 268 01/08/2023   FERRITIN 193 (H) 01/08/2023    Attestation Statements:   Reviewed by clinician on day of visit: allergies, medications, problem list, medical history, surgical history, family history, social history, and previous encounter notes.  Time spent on visit including pre-visit chart review and post-visit care and charting was 40 minutes.   I, Malcolm Metro, am acting as Energy manager for Seymour Bars, DO.  I have reviewed the above  documentation for accuracy and completeness, and I agree with the above. Seymour Bars DO

## 2023-01-09 LAB — CBC WITH DIFFERENTIAL/PLATELET
Basophils Absolute: 0 10*3/uL (ref 0.0–0.2)
Basos: 0 %
EOS (ABSOLUTE): 0.2 10*3/uL (ref 0.0–0.4)
Eos: 2 %
Hematocrit: 43.3 % (ref 34.0–46.6)
Hemoglobin: 14.2 g/dL (ref 11.1–15.9)
Immature Grans (Abs): 0 10*3/uL (ref 0.0–0.1)
Immature Granulocytes: 0 %
Lymphocytes Absolute: 2.1 10*3/uL (ref 0.7–3.1)
Lymphs: 29 %
MCH: 28.6 pg (ref 26.6–33.0)
MCHC: 32.8 g/dL (ref 31.5–35.7)
MCV: 87 fL (ref 79–97)
Monocytes Absolute: 0.5 10*3/uL (ref 0.1–0.9)
Monocytes: 6 %
Neutrophils Absolute: 4.5 10*3/uL (ref 1.4–7.0)
Neutrophils: 63 %
Platelets: 356 10*3/uL (ref 150–450)
RBC: 4.97 x10E6/uL (ref 3.77–5.28)
RDW: 13 % (ref 11.7–15.4)
WBC: 7.2 10*3/uL (ref 3.4–10.8)

## 2023-01-09 LAB — COMPREHENSIVE METABOLIC PANEL
ALT: 19 IU/L (ref 0–32)
AST: 19 IU/L (ref 0–40)
Albumin/Globulin Ratio: 1.4 (ref 1.2–2.2)
Albumin: 4.2 g/dL (ref 3.9–4.9)
Alkaline Phosphatase: 105 IU/L (ref 44–121)
BUN/Creatinine Ratio: 20 (ref 12–28)
BUN: 14 mg/dL (ref 8–27)
Bilirubin Total: 0.4 mg/dL (ref 0.0–1.2)
CO2: 24 mmol/L (ref 20–29)
Calcium: 9.5 mg/dL (ref 8.7–10.3)
Chloride: 102 mmol/L (ref 96–106)
Creatinine, Ser: 0.7 mg/dL (ref 0.57–1.00)
Globulin, Total: 2.9 g/dL (ref 1.5–4.5)
Glucose: 83 mg/dL (ref 70–99)
Potassium: 4.7 mmol/L (ref 3.5–5.2)
Sodium: 142 mmol/L (ref 134–144)
Total Protein: 7.1 g/dL (ref 6.0–8.5)
eGFR: 98 mL/min/{1.73_m2} (ref >59)

## 2023-01-09 LAB — LIPID PANEL WITH LDL/HDL RATIO
Cholesterol, Total: 191 mg/dL (ref 100–199)
HDL: 59 mg/dL (ref >39)
LDL Chol Calc (NIH): 114 mg/dL — ABNORMAL HIGH (ref 0–99)
LDL/HDL Ratio: 1.9 ratio (ref 0.0–3.2)
Triglycerides: 98 mg/dL (ref 0–149)
VLDL Cholesterol Cal: 18 mg/dL (ref 5–40)

## 2023-01-09 LAB — IRON AND TIBC
Iron Saturation: 31 % (ref 15–55)
Iron: 82 ug/dL (ref 27–139)
Total Iron Binding Capacity: 268 ug/dL (ref 250–450)
UIBC: 186 ug/dL (ref 118–369)

## 2023-01-09 LAB — FOLATE: Folate: >20 ng/mL (ref >3.0)

## 2023-01-09 LAB — HEMOGLOBIN A1C
Est. average glucose Bld gHb Est-mCnc: 120 mg/dL
Hgb A1c MFr Bld: 5.8 % — ABNORMAL HIGH (ref 4.8–5.6)

## 2023-01-09 LAB — VITAMIN D 25 HYDROXY (VIT D DEFICIENCY, FRACTURES): Vit D, 25-Hydroxy: 36.3 ng/mL (ref 30.0–100.0)

## 2023-01-09 LAB — INSULIN, RANDOM: INSULIN: 15.4 u[IU]/mL (ref 2.6–24.9)

## 2023-01-09 LAB — TSH: TSH: 2.41 u[IU]/mL (ref 0.450–4.500)

## 2023-01-09 LAB — FERRITIN: Ferritin: 193 ng/mL — ABNORMAL HIGH (ref 15–150)

## 2023-01-09 LAB — T4, FREE: Free T4: 1.34 ng/dL (ref 0.82–1.77)

## 2023-01-09 LAB — PREALBUMIN: PREALBUMIN: 23 mg/dL (ref 10–36)

## 2023-01-09 LAB — VITAMIN B12: Vitamin B-12: 499 pg/mL (ref 232–1245)

## 2023-01-22 ENCOUNTER — Ambulatory Visit (INDEPENDENT_AMBULATORY_CARE_PROVIDER_SITE_OTHER): Payer: BC Managed Care – PPO | Admitting: Family Medicine

## 2023-01-22 ENCOUNTER — Encounter (INDEPENDENT_AMBULATORY_CARE_PROVIDER_SITE_OTHER): Payer: Self-pay | Admitting: Family Medicine

## 2023-01-22 VITALS — BP 138/82 | HR 70 | Temp 98.2°F | Ht 60.0 in | Wt 302.0 lb

## 2023-01-22 DIAGNOSIS — E559 Vitamin D deficiency, unspecified: Secondary | ICD-10-CM

## 2023-01-22 DIAGNOSIS — Z6841 Body Mass Index (BMI) 40.0 and over, adult: Secondary | ICD-10-CM | POA: Diagnosis not present

## 2023-01-22 DIAGNOSIS — Z9884 Bariatric surgery status: Secondary | ICD-10-CM

## 2023-01-22 DIAGNOSIS — E785 Hyperlipidemia, unspecified: Secondary | ICD-10-CM | POA: Insufficient documentation

## 2023-01-22 MED ORDER — VITAMIN D (ERGOCALCIFEROL) 1.25 MG (50000 UNIT) PO CAPS
50000.0000 [IU] | ORAL_CAPSULE | ORAL | 0 refills | Status: DC
Start: 2023-01-22 — End: 2023-02-11

## 2023-01-22 NOTE — Assessment & Plan Note (Signed)
Reviewed lab from last visit  Last vitamin D Lab Results  Component Value Date   VD25OH 36.3 01/08/2023    Vitamin D def can contribute to leptin deficiency, poor immune function, osteoporosis and fatigue as discussed with patient today.  Begin RX vitamin D 50,000 IU once weekly. Recheck level in 3-4 mos

## 2023-01-22 NOTE — Assessment & Plan Note (Addendum)
The 10-year ASCVD risk score (Arnett DK, et al., 2019) is: 8.7%   Values used to calculate the score:     Age: 62 years     Sex: Female     Is Non-Hispanic African American: Yes     Diabetic: No     Tobacco smoker: No     Systolic Blood Pressure: 138 mmHg     Is BP treated: Yes     HDL Cholesterol: 59 mg/dL     Total Cholesterol: 191 mg/dL + fam hx of premature heart disease  Lab Results  Component Value Date   CHOL 191 01/08/2023   HDL 59 01/08/2023   LDLCALC 114 (H) 01/08/2023   TRIG 98 01/08/2023  She is not on any lipid lowering medications.    Continue to work on a low saturated fat diet and recheck FLP in 4 mos Consider statin use given her risk factors.

## 2023-01-22 NOTE — Assessment & Plan Note (Signed)
Reviewed vitamin levels drawn on recent labs.  She is taking a MVI daily, OTC iron and Caltrate D daily as directed.   She is feeling improved satiety with meals with current meal plan which is higher in protein. She denies heartburn, nausea or vomiting.  Continue to work on 3 smaller meals, + 2 snacks per day each with a focus on lean protein and fiber Reviewed examples today. Continue current supplements.

## 2023-01-22 NOTE — Assessment & Plan Note (Signed)
Improving on prescribed meal plan Has room for improvement staying on plan and reducing frequency of dessert intake Plans to try out more of the high protein snack options Thinking about getting back to the gym, limited by arthritis  Plan: Set a goal for gym workouts 1 x a week Continue to limit quantity of desserts in social settings

## 2023-01-22 NOTE — Progress Notes (Signed)
Office: [phone]  /  Fax: [phone]  WEIGHT SUMMARY AND BIOMETRICS  Starting Date: 01/08/23  Starting Weight: 305lb   Weight Lost Since Last Visit: 3lb   Vitals Temp: 98.2 F (36.8 C) BP: 138/82 Pulse Rate: 70 SpO2: 97 %   Body Composition  Body Fat %: 65 % Fat Mass (lbs): 196.8 lbs Muscle Mass (lbs): 100.6 lbs Visceral Fat Rating : 29    HPI  Chief Complaint: OBESITY  Sarah Dodson is here to discuss her progress with her obesity treatment plan. She is on the the Category 3 Plan and states she is following her eating plan approximately 75 % of the time. She states she is trying to exercise but hasn't yet.   Interval History:  Since last office visit she is down 3 lb She gained 0.4 lb of muscle and lost 2.6 lb of body fat She is eating most foods on plan She did eat some cake at a fish fry and banana pudding at a church function She is weighing her food and this has helped her with portion control She has cut back on snacking She has some food cravings, sometimes late at night She swapped out chips for morning snack to a protein bar  Pharmacotherapy: none  PHYSICAL EXAM:  Blood pressure 138/82, pulse 70, temperature 98.2 F (36.8 C), height 5' (1.524 m), weight (!) 302 lb (137 kg), SpO2 97 %. Body mass index is 58.98 kg/m.  General: She is overweight, cooperative, alert, well developed, and in no acute distress. PSYCH: Has normal mood, affect and thought process.   Lungs: Normal breathing effort, no conversational dyspnea.   ASSESSMENT AND PLAN  TREATMENT PLAN FOR OBESITY:  Recommended Dietary Goals  Sarah Dodson is currently in the action stage of change. As such, her goal is to continue weight management plan. She has agreed to the Category 3 Plan.  Behavioral Intervention  We discussed the following Behavioral Modification Strategies today: increasing lean protein intake, decreasing simple carbohydrates , increasing vegetables, increasing lower  glycemic fruits, increasing fiber rich foods, avoiding skipping meals, increasing water intake, continue to practice mindfulness when eating, planning for success, and better snacking choices.  Additional resources provided today: NA  Recommended Physical Activity Goals  Sarah Dodson has been advised to work up to 150 minutes of moderate intensity aerobic activity a week and strengthening exercises 2-3 times per week for cardiovascular health, weight loss maintenance and preservation of muscle mass.   She has agreed to Think about ways to increase physical activity  Pharmacotherapy changes for the treatment of obesity: none  ASSOCIATED CONDITIONS ADDRESSED TODAY  Hyperlipidemia, unspecified hyperlipidemia type Assessment & Plan: The 10-year ASCVD risk score (Arnett DK, et al., 2019) is: 8.7%   Values used to calculate the score:     Age: 62 years     Sex: Female     Is Non-Hispanic African American: Yes     Diabetic: No     Tobacco smoker: No     Systolic Blood Pressure: 138 mmHg     Is BP treated: Yes     HDL Cholesterol: 59 mg/dL     Total Cholesterol: 191 mg/dL + fam hx of premature heart disease  Lab Results  Component Value Date   CHOL 191 01/08/2023   HDL 59 01/08/2023   LDLCALC 114 (H) 01/08/2023   TRIG 98 01/08/2023  She is not on any lipid lowering medications.    Continue to work on a low saturated fat diet and recheck FLP  in 4 mos Consider statin use given her risk factors.    Vitamin D deficiency Assessment & Plan: Reviewed lab from last visit  Last vitamin D Lab Results  Component Value Date   VD25OH 36.3 01/08/2023    Vitamin D def can contribute to leptin deficiency, poor immune function, osteoporosis and fatigue as discussed with patient today.  Begin RX vitamin D 50,000 IU once weekly. Recheck level in 3-4 mos  Orders: -     Vitamin D (Ergocalciferol); Take 1 capsule (50,000 Units total) by mouth every 7 (seven) days.  Dispense: 5 capsule; Refill:  0  S/P laparoscopic sleeve gastrectomy Assessment & Plan: Reviewed vitamin levels drawn on recent labs.  She is taking a MVI daily, OTC iron and Caltrate D daily as directed.   She is feeling improved satiety with meals with current meal plan which is higher in protein. She denies heartburn, nausea or vomiting.  Continue to work on 3 smaller meals, + 2 snacks per day each with a focus on lean protein and fiber Reviewed examples today. Continue current supplements.   Morbid obesity Assessment & Plan: Improving on prescribed meal plan Has room for improvement staying on plan and reducing frequency of dessert intake Plans to try out more of the high protein snack options Thinking about getting back to the gym, limited by arthritis  Plan: Set a goal for gym workouts 1 x a week Continue to limit quantity of desserts in social settings   BMI 50.0-59.9, adult      She was informed of the importance of frequent follow up visits to maximize her success with intensive lifestyle modifications for her multiple health conditions.   ATTESTASTION STATEMENTS:  Reviewed by clinician on day of visit: allergies, medications, problem list, medical history, surgical history, family history, social history, and previous encounter notes pertinent to obesity diagnosis.   I have personally spent 30 minutes total time today in preparation, patient care, nutritional counseling and documentation for this visit, including the following: review of clinical lab tests; review of medical tests/procedures/services.      Sarah Brink, DO DABFM, DABOM [facility] [address] [place], [place] [address] [phone]

## 2023-02-05 DIAGNOSIS — R7309 Other abnormal glucose: Secondary | ICD-10-CM | POA: Diagnosis not present

## 2023-02-11 ENCOUNTER — Encounter (INDEPENDENT_AMBULATORY_CARE_PROVIDER_SITE_OTHER): Payer: Self-pay | Admitting: Family Medicine

## 2023-02-11 ENCOUNTER — Ambulatory Visit (INDEPENDENT_AMBULATORY_CARE_PROVIDER_SITE_OTHER): Payer: BC Managed Care – PPO | Admitting: Family Medicine

## 2023-02-11 VITALS — BP 136/83 | HR 81 | Temp 98.6°F | Ht 60.0 in | Wt 302.0 lb

## 2023-02-11 DIAGNOSIS — R7303 Prediabetes: Secondary | ICD-10-CM | POA: Diagnosis not present

## 2023-02-11 DIAGNOSIS — E785 Hyperlipidemia, unspecified: Secondary | ICD-10-CM | POA: Diagnosis not present

## 2023-02-11 DIAGNOSIS — Z6841 Body Mass Index (BMI) 40.0 and over, adult: Secondary | ICD-10-CM

## 2023-02-11 DIAGNOSIS — E559 Vitamin D deficiency, unspecified: Secondary | ICD-10-CM | POA: Diagnosis not present

## 2023-02-11 DIAGNOSIS — Z9884 Bariatric surgery status: Secondary | ICD-10-CM

## 2023-02-11 MED ORDER — VITAMIN D (ERGOCALCIFEROL) 1.25 MG (50000 UNIT) PO CAPS
50000.0000 [IU] | ORAL_CAPSULE | ORAL | 0 refills | Status: DC
Start: 2023-02-11 — End: 2023-03-17

## 2023-02-11 NOTE — Assessment & Plan Note (Signed)
Last vitamin D Lab Results  Component Value Date   VD25OH 36.3 01/08/2023   She is doing well on RX vitamin D 50,000 IU weekly. Energy level is improving. She denies adverse SE  Recheck level in 2-3 mos

## 2023-02-11 NOTE — Assessment & Plan Note (Signed)
She is doing a good job with small portion sizes at meals and lean protein with meals/ snacks She denies food aversions Denies N/V Takes a MVI daily  Will continue to work on getting her towards her nadir weight of 237 lb

## 2023-02-11 NOTE — Progress Notes (Unsigned)
   Office: [phone]  /  Fax: [phone]  WEIGHT SUMMARY AND BIOMETRICS  Starting Date: 01/08/23  Starting Weight: 305lb   Weight Lost Since Last Visit: 0   Vitals Temp: 98.6 F (37 C) BP: 136/83 Pulse Rate: 81 SpO2: 97 %   Body Composition  Body Fat %: 65.3 % Fat Mass (lbs): 197.4 lbs Muscle Mass (lbs): 99.4 lbs Visceral Fat Rating : 29     HPI  Chief Complaint: OBESITY  Natylee is here to discuss her progress with her obesity treatment plan. She is on the the Category 3 Plan and states she is following her eating plan approximately 80 % of the time. She states she is exercising 15 minutes 5 times per week.   Interval History:  Since last office visit she is down 0 lb She has a net weight loss of 3 lb in the past month She has been having a hard time getting to sleep at night She has been busy taking care of things at home She plans to walk her dogs more She is busy with church activities She is having problems making time for herself   Pharmacotherapy: none  PHYSICAL EXAM:  Blood pressure 136/83, pulse 81, temperature 98.6 F (37 C), height 5' (1.524 m), weight (!) 302 lb (137 kg), SpO2 97 %. Body mass index is 58.98 kg/m.  General: She is overweight, cooperative, alert, well developed, and in no acute distress. PSYCH: Has normal mood, affect and thought process.   Lungs: Normal breathing effort, no conversational dyspnea.   ASSESSMENT AND PLAN  TREATMENT PLAN FOR OBESITY:  Recommended Dietary Goals  Heela is currently in the action stage of change. As such, her goal is to continue weight management plan. She has agreed to the Category 3 Plan.  Behavioral Intervention  We discussed the following Behavioral Modification Strategies today: increasing lean protein intake, decreasing simple carbohydrates , increasing vegetables, increasing lower glycemic fruits, increasing water intake, work on managing stress, creating time for self-care and  relaxation measures, avoiding temptations and identifying enticing environmental cues, continue to practice mindfulness when eating, and planning for success.  Additional resources provided today: NA  Recommended Physical Activity Goals  Jamarra has been advised to work up to 150 minutes of moderate intensity aerobic activity a week and strengthening exercises 2-3 times per week for cardiovascular health, weight loss maintenance and preservation of muscle mass.   She has agreed to Think about ways to increase physical activity  Pharmacotherapy changes for the treatment of obesity: none  ASSOCIATED CONDITIONS ADDRESSED TODAY  Vitamin D deficiency      She was informed of the importance of frequent follow up visits to maximize her success with intensive lifestyle modifications for her multiple health conditions.   ATTESTASTION STATEMENTS:  Reviewed by clinician on day of visit: allergies, medications, problem list, medical history, surgical history, family history, social history, and previous encounter notes pertinent to obesity diagnosis.   I have personally spent 30 minutes total time today in preparation, patient care, nutritional counseling and documentation for this visit, including the following: review of clinical lab tests; review of medical tests/procedures/services.      Godfrey Pick Kleber Crean, CMA DABFM, DABOM [facility] [address] [place], [place] [address] [phone]

## 2023-02-11 NOTE — Assessment & Plan Note (Signed)
Lab Results  Component Value Date   CHOL 191 01/08/2023   HDL 59 01/08/2023   LDLCALC 114 (H) 01/08/2023   TRIG 98 01/08/2023   She is actively working on a low saturated fat diet.  Plan to check FLP.

## 2023-02-12 DIAGNOSIS — R7303 Prediabetes: Secondary | ICD-10-CM | POA: Insufficient documentation

## 2023-02-12 NOTE — Assessment & Plan Note (Signed)
Lab Results  Component Value Date   HGBA1C 5.8 (H) 01/08/2023   Working on prescribed meal plan which is low in added sugar and starches.  She has room to increase walking time.   She is currently not on metformin.   Continue current prescribed dietary plan.  We discussed options to add in more physical activity Recheck A1c in 3 mos

## 2023-03-08 DIAGNOSIS — R7309 Other abnormal glucose: Secondary | ICD-10-CM | POA: Diagnosis not present

## 2023-03-17 ENCOUNTER — Encounter (INDEPENDENT_AMBULATORY_CARE_PROVIDER_SITE_OTHER): Payer: Self-pay | Admitting: Family Medicine

## 2023-03-17 ENCOUNTER — Ambulatory Visit (INDEPENDENT_AMBULATORY_CARE_PROVIDER_SITE_OTHER): Payer: BC Managed Care – PPO | Admitting: Family Medicine

## 2023-03-17 VITALS — BP 144/81 | HR 76 | Temp 99.0°F | Ht 60.0 in | Wt 301.0 lb

## 2023-03-17 DIAGNOSIS — Z6841 Body Mass Index (BMI) 40.0 and over, adult: Secondary | ICD-10-CM

## 2023-03-17 DIAGNOSIS — Z9884 Bariatric surgery status: Secondary | ICD-10-CM

## 2023-03-17 DIAGNOSIS — I10 Essential (primary) hypertension: Secondary | ICD-10-CM

## 2023-03-17 DIAGNOSIS — R7303 Prediabetes: Secondary | ICD-10-CM | POA: Diagnosis not present

## 2023-03-17 DIAGNOSIS — E559 Vitamin D deficiency, unspecified: Secondary | ICD-10-CM | POA: Diagnosis not present

## 2023-03-17 MED ORDER — VITAMIN D (ERGOCALCIFEROL) 1.25 MG (50000 UNIT) PO CAPS
50000.0000 [IU] | ORAL_CAPSULE | ORAL | 0 refills | Status: DC
Start: 2023-03-17 — End: 2023-04-08

## 2023-03-17 MED ORDER — METFORMIN HCL ER 500 MG PO TB24
500.0000 mg | ORAL_TABLET | Freq: Every day | ORAL | 0 refills | Status: DC
Start: 2023-03-17 — End: 2023-04-09

## 2023-03-17 NOTE — Assessment & Plan Note (Signed)
Last vitamin D Lab Results  Component Value Date   VD25OH 36.3 01/08/2023   She is taking vitamin D 50,000 IU weekly  Denies adverse SE Energy level has improved  Continue RX vitamin D 50,000 IU weekly Recheck level in 2-3 mos

## 2023-03-17 NOTE — Assessment & Plan Note (Signed)
Blood pressure is elevated today at 144/81.  She is on HCTZ 12.5 mg, 2 tabs once daily.  She denies adverse side effects.  She denies headaches or chest pain.  She did eat a high sodium meal yesterday.  Will look for improvements in blood pressure readings at her subsequent visit with reducing intake of high sodium foods.  Continue HCTZ 25 mg daily.

## 2023-03-17 NOTE — Assessment & Plan Note (Signed)
She reports some improvements in satiety status post vertical sleeve gastrectomy.  She avoids drinking with meals.  She is consistently taking a multivitamin, calcium and vitamin D daily.  She is trying to get lower than her previous nadir weight of 237 pounds.  She denies issues of heartburn, nausea or vomiting.  She is more prone to constipation.  We discussed the importance of allowing 15 to 20 minutes for meal completion, avoiding drinking with meals, limiting carbonated beverages and drinking water outside of mealtime.

## 2023-03-17 NOTE — Assessment & Plan Note (Signed)
Lab Results  Component Value Date   HGBA1C 5.8 (H) 01/08/2023   She is actively working on reducing her intake of sweets Has never used metformin  Continue to limit SSBs OK to drink Crystal Light + water Begin metformin XR 500 mg with dinner daily Recheck labs in 2 mos

## 2023-03-17 NOTE — Progress Notes (Signed)
Office: [phone]  /  Fax: [phone]  WEIGHT SUMMARY AND BIOMETRICS  Starting Date: 01/08/23  Starting Weight: 305lb   Weight Lost Since Last Visit: 1lb   Vitals Temp: 99 F (37.2 C) BP: (!) 144/81 Pulse Rate: 76 SpO2: 98 %   Body Composition  Body Fat %: 64.7 % Fat Mass (lbs): 195.2 lbs Muscle Mass (lbs): 101 lbs Visceral Fat Rating : 29     HPI  Chief Complaint: OBESITY  Sarah Dodson is here to discuss her progress with her obesity treatment plan. She is on the the Category 3 Plan and states she is following her eating plan approximately 40 % of the time. She states she is exercising 60 minutes 2-3 times per week.   Interval History:  Since last office visit she is down 1 lb She gained 1.6 lb of muscle mass and lost 2.2 lb of body fat She has a net weight loss of 4 lb in the past 2 mos She reports getting a dog bite on her R hand but it has healed well-- this one was of her dogs She has reduced meals out She is avoiding fried foods She has splurged on [employer] (fries and pulled pork) She does better with her routine on work days She has had more constipation  Pharmacotherapy: none  PHYSICAL EXAM:  Blood pressure (!) 144/81, pulse 76, temperature 99 F (37.2 C), height 5' (1.524 m), weight (!) 301 lb (136.5 kg), SpO2 98 %. Body mass index is 58.79 kg/m.  General: She is overweight, cooperative, alert, well developed, and in no acute distress. PSYCH: Has normal mood, affect and thought process.   Lungs: Normal breathing effort, no conversational dyspnea.   ASSESSMENT AND PLAN  TREATMENT PLAN FOR OBESITY:  Recommended Dietary Goals  Sarah Dodson is currently in the action stage of change. As such, her goal is to continue weight management plan. She has agreed to the Category 3 Plan.  Behavioral Intervention  We discussed the following Behavioral Modification Strategies today: increasing lean protein intake, decreasing simple carbohydrates ,  increasing vegetables, increasing lower glycemic fruits, increasing water intake, work on managing stress, creating time for self-care and relaxation measures, avoiding temptations and identifying enticing environmental cues, continue to practice mindfulness when eating, and planning for success.  Additional resources provided today: NA  Recommended Physical Activity Goals  Sarah Dodson has been advised to work up to 150 minutes of moderate intensity aerobic activity a week and strengthening exercises 2-3 times per week for cardiovascular health, weight loss maintenance and preservation of muscle mass.   She has agreed to Think about ways to increase daily physical activity and overcoming barriers to exercise  Pharmacotherapy changes for the treatment of obesity:  metformin 500 mg XR daily with dinner  ASSOCIATED CONDITIONS ADDRESSED TODAY  Prediabetes Assessment & Plan: Lab Results  Component Value Date   HGBA1C 5.8 (H) 01/08/2023   She is actively working on reducing her intake of sweets Has never used metformin  Continue to limit SSBs OK to drink Crystal Light + water Begin metformin XR 500 mg with dinner daily Recheck labs in 2 mos  Orders: -     metFORMIN HCl ER; Take 1 tablet (500 mg total) by mouth daily with supper.  Dispense: 30 tablet; Refill: 0  Vitamin D deficiency Assessment & Plan: Last vitamin D Lab Results  Component Value Date   VD25OH 36.3 01/08/2023   She is taking vitamin D 50,000 IU weekly  Denies adverse SE Energy level  has improved  Continue RX vitamin D 50,000 IU weekly Recheck level in 2-3 mos  Orders: -     Vitamin D (Ergocalciferol); Take 1 capsule (50,000 Units total) by mouth every 7 (seven) days.  Dispense: 5 capsule; Refill: 0  Morbid obesity with starting BMI 59  BMI 50.0-59.9, adult (HCC)  S/P laparoscopic sleeve gastrectomy Assessment & Plan: She reports some improvements in satiety status post vertical sleeve gastrectomy.  She avoids  drinking with meals.  She is consistently taking a multivitamin, calcium and vitamin D daily.  She is trying to get lower than her previous nadir weight of 237 pounds.  She denies issues of heartburn, nausea or vomiting.  She is more prone to constipation.  We discussed the importance of allowing 15 to 20 minutes for meal completion, avoiding drinking with meals, limiting carbonated beverages and drinking water outside of mealtime.   Primary hypertension Assessment & Plan: Blood pressure is elevated today at 144/81.  She is on HCTZ 12.5 mg, 2 tabs once daily.  She denies adverse side effects.  She denies headaches or chest pain.  She did eat a high sodium meal yesterday.  Will look for improvements in blood pressure readings at her subsequent visit with reducing intake of high sodium foods.  Continue HCTZ 25 mg daily.       She was informed of the importance of frequent follow up visits to maximize her success with intensive lifestyle modifications for her multiple health conditions.   ATTESTASTION STATEMENTS:  Reviewed by clinician on day of visit: allergies, medications, problem list, medical history, surgical history, family history, social history, and previous encounter notes pertinent to obesity diagnosis.   I have personally spent 30 minutes total time today in preparation, patient care, nutritional counseling and documentation for this visit, including the following: review of clinical lab tests; review of medical tests/procedures/services.      Glennis Brink, DO DABFM, DABOM [facility] [address] [place], [place] [address] [phone]

## 2023-04-07 DIAGNOSIS — R7309 Other abnormal glucose: Secondary | ICD-10-CM | POA: Diagnosis not present

## 2023-04-08 ENCOUNTER — Encounter (INDEPENDENT_AMBULATORY_CARE_PROVIDER_SITE_OTHER): Payer: Self-pay | Admitting: Family Medicine

## 2023-04-08 ENCOUNTER — Ambulatory Visit (INDEPENDENT_AMBULATORY_CARE_PROVIDER_SITE_OTHER): Payer: BC Managed Care – PPO | Admitting: Family Medicine

## 2023-04-08 VITALS — BP 116/77 | HR 74 | Temp 98.1°F | Ht 60.0 in | Wt 301.0 lb

## 2023-04-08 DIAGNOSIS — Z6841 Body Mass Index (BMI) 40.0 and over, adult: Secondary | ICD-10-CM

## 2023-04-08 DIAGNOSIS — E559 Vitamin D deficiency, unspecified: Secondary | ICD-10-CM

## 2023-04-08 DIAGNOSIS — R7303 Prediabetes: Secondary | ICD-10-CM | POA: Diagnosis not present

## 2023-04-08 DIAGNOSIS — Z9884 Bariatric surgery status: Secondary | ICD-10-CM

## 2023-04-08 DIAGNOSIS — E88819 Insulin resistance, unspecified: Secondary | ICD-10-CM

## 2023-04-08 MED ORDER — SEMAGLUTIDE(0.25 OR 0.5MG/DOS) 2 MG/3ML ~~LOC~~ SOPN
0.2500 mg | PEN_INJECTOR | SUBCUTANEOUS | 0 refills | Status: AC
Start: 2023-04-08 — End: ?

## 2023-04-08 MED ORDER — VITAMIN D (ERGOCALCIFEROL) 1.25 MG (50000 UNIT) PO CAPS
50000.0000 [IU] | ORAL_CAPSULE | ORAL | 0 refills | Status: DC
Start: 2023-04-08 — End: 2023-06-05

## 2023-04-08 NOTE — Progress Notes (Signed)
Office: [phone]  /  Fax: [phone]  WEIGHT SUMMARY AND BIOMETRICS  Starting Date: 01/08/23  Starting Weight: 305lb   Weight Lost Since Last Visit: 0lb   Vitals Temp: 98.1 F (36.7 C) BP: 116/77 Pulse Rate: 74 SpO2: 98 %   Body Composition  Body Fat %: 64.8 % Fat Mass (lbs): 195.4 lbs Muscle Mass (lbs): 100.6 lbs Visceral Fat Rating : 29     HPI  Chief Complaint: OBESITY  Sarah Dodson is here to discuss her progress with her obesity treatment plan. She is on the the Category 3 Plan and states she is following her eating plan approximately 20 % of the time. She states she is exercising 30-60 minutes 2-3 times per week.   Interval History:  Since last office visit she is down 0 lb She is net down 4 lb in the past 3 mos She has gotten off track with mashed potatoes and desserts (made at home) She tends to get off track with church functions She has some early satiety from her sleeve gastrectomy She is used to eating fast due to work Walking has been limited due to chronic right ankle pain, low back pain and DJD of the right knee. Able to do water exercise 1-3 times a week    Pharmacotherapy: metformin XR 500 mg once daily for PDM  PHYSICAL EXAM:  Blood pressure 116/77, pulse 74, temperature 98.1 F (36.7 C), height 5' (1.524 m), weight (!) 301 lb (136.5 kg), SpO2 98 %. Body mass index is 58.79 kg/m.  General: She is overweight, cooperative, alert, well developed, and in no acute distress. PSYCH: Has normal mood, affect and thought process.   Lungs: Normal breathing effort, no conversational dyspnea.   ASSESSMENT AND PLAN  TREATMENT PLAN FOR OBESITY:  Recommended Dietary Goals  Demitri is currently in the action stage of change. As such, her goal is to continue weight management plan. She has agreed to the Category 3 Plan.  Behavioral Intervention  We discussed the following Behavioral Modification Strategies today: increasing lean protein  intake, decreasing simple carbohydrates , increasing vegetables, increasing lower glycemic fruits, avoiding skipping meals, increasing water intake, reading food labels , keeping healthy foods at home, work on managing stress, creating time for self-care and relaxation measures, avoiding temptations and identifying enticing environmental cues, continue to practice mindfulness when eating, and planning for success.  Additional resources provided today: NA  Recommended Physical Activity Goals  Quinlynn has been advised to work up to 150 minutes of moderate intensity aerobic activity a week and strengthening exercises 2-3 times per week for cardiovascular health, weight loss maintenance and preservation of muscle mass.   She has agreed to Increase the intensity, frequency or duration of aerobic exercises    Pharmacotherapy changes for the treatment of obesity: Begin Ozempic 0.25 mg once weekly injection for insulin resistance  ASSOCIATED CONDITIONS ADDRESSED TODAY  Insulin resistance Assessment & Plan: History of insulin resistance with her last fasting insulin elevated at 15.3.  She is doing well on metformin XR 500 mg once daily with dinner and tolerating this well.  She has reduced her intake of added sugar and refined carbohydrates and has been reading labels.  She is doing water exercises 1-3 times a week.  She is a good candidate for use of Ozempic 0.25 mg once weekly injection for the treatment of insulin resistance. Patient denies a personal or family history of pancreatitis, medullary thyroid carcinoma or multiple endocrine neoplasia type II. Recommend reviewing pen training video  online.   Orders: -     Semaglutide(0.25 or 0.5MG /DOS); Inject 0.25 mg into the skin once a week.  Dispense: 3 mL; Refill: 0  Vitamin D deficiency Assessment & Plan: Last vitamin D Lab Results  Component Value Date   VD25OH 36.3 01/08/2023   She is doing well on prescription vitamin D 50,000 IU once  weekly.  Energy level is improving.  Denies adverse side effect.  Recheck vitamin D level in the next 1 month  Orders: -     Vitamin D (Ergocalciferol); Take 1 capsule (50,000 Units total) by mouth every 7 (seven) days.  Dispense: 5 capsule; Refill: 0  Morbid obesity with starting BMI 59  BMI 50.0-59.9, adult (HCC)  Prediabetes Assessment & Plan: Lab Results  Component Value Date   HGBA1C 5.8 (H) 01/08/2023   Doing well with new start metformin XR 500 mg once daily with dinner.  Recheck chemistry panel, B12 and A1c in the next 1 month.  She is working on dietary changes and regular exercise.  Continue to limit intake of high sugar items.  Aim for 30 minutes of exercise at least 3 days a week. Continue metformin XR 500 mg once daily with food.   S/P laparoscopic sleeve gastrectomy Assessment & Plan: She is up from her previous postop nadir weight of 237 pounds.  She still has some volume restriction at mealtime from her vertical sleeve gastrectomy.  She denies issues with nausea or heartburn.  She is taking a multivitamin most days of the week.  We discussed allowing 20 minutes at mealtime for eating, drinking outside of mealtime and getting lean protein with each meal and snack. Recommend continuing on a multivitamin daily.       She was informed of the importance of frequent follow up visits to maximize her success with intensive lifestyle modifications for her multiple health conditions.   ATTESTASTION STATEMENTS:  Reviewed by clinician on day of visit: allergies, medications, problem list, medical history, surgical history, family history, social history, and previous encounter notes pertinent to obesity diagnosis.   I have personally spent 30 minutes total time today in preparation, patient care, nutritional counseling and documentation for this visit, including the following: review of clinical lab tests; review of medical tests/procedures/services.      Glennis Brink, DO DABFM, DABOM [facility] [address] [place], [place] [address] [phone]

## 2023-04-08 NOTE — Assessment & Plan Note (Signed)
Lab Results  Component Value Date   HGBA1C 5.8 (H) 01/08/2023   Doing well with new start metformin XR 500 mg once daily with dinner.  Recheck chemistry panel, B12 and A1c in the next 1 month.  She is working on dietary changes and regular exercise.  Continue to limit intake of high sugar items.  Aim for 30 minutes of exercise at least 3 days a week. Continue metformin XR 500 mg once daily with food.

## 2023-04-08 NOTE — Assessment & Plan Note (Signed)
History of insulin resistance with her last fasting insulin elevated at 15.3.  She is doing well on metformin XR 500 mg once daily with dinner and tolerating this well.  She has reduced her intake of added sugar and refined carbohydrates and has been reading labels.  She is doing water exercises 1-3 times a week.  She is a good candidate for use of Ozempic 0.25 mg once weekly injection for the treatment of insulin resistance. Patient denies a personal or family history of pancreatitis, medullary thyroid carcinoma or multiple endocrine neoplasia type II. Recommend reviewing pen training video online.

## 2023-04-08 NOTE — Assessment & Plan Note (Signed)
Last vitamin D Lab Results  Component Value Date   VD25OH 36.3 01/08/2023   She is doing well on prescription vitamin D 50,000 IU once weekly.  Energy level is improving.  Denies adverse side effect.  Recheck vitamin D level in the next 1 month

## 2023-04-08 NOTE — Assessment & Plan Note (Signed)
She is up from her previous postop nadir weight of 237 pounds.  She still has some volume restriction at mealtime from her vertical sleeve gastrectomy.  She denies issues with nausea or heartburn.  She is taking a multivitamin most days of the week.  We discussed allowing 20 minutes at mealtime for eating, drinking outside of mealtime and getting lean protein with each meal and snack. Recommend continuing on a multivitamin daily.

## 2023-04-09 ENCOUNTER — Other Ambulatory Visit (INDEPENDENT_AMBULATORY_CARE_PROVIDER_SITE_OTHER): Payer: Self-pay | Admitting: Family Medicine

## 2023-04-09 ENCOUNTER — Telehealth (INDEPENDENT_AMBULATORY_CARE_PROVIDER_SITE_OTHER): Payer: Self-pay | Admitting: Family Medicine

## 2023-04-09 DIAGNOSIS — R7303 Prediabetes: Secondary | ICD-10-CM

## 2023-04-09 NOTE — Telephone Encounter (Signed)
Prior authorization done via cover my meds for patients Ozempic. Waiting on determination.  

## 2023-04-11 ENCOUNTER — Other Ambulatory Visit (INDEPENDENT_AMBULATORY_CARE_PROVIDER_SITE_OTHER): Payer: Self-pay | Admitting: Family Medicine

## 2023-04-11 DIAGNOSIS — E88819 Insulin resistance, unspecified: Secondary | ICD-10-CM

## 2023-04-15 NOTE — Telephone Encounter (Signed)
Prior authorization denied for patients Ozempic. Patient and Dr.Bowen notified.

## 2023-04-21 NOTE — Telephone Encounter (Signed)
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  Sarah Dodson

## 2023-04-21 NOTE — Telephone Encounter (Signed)
Call and spoke to patient today.    Patient states that she called [employer].  Patient states that she spoke to a representative and was told that we need to write a letter to say why she needs to have her medication.  She is asking about [employer] since the Ozempic was denied. What needs to be included in the letter.  Patient states that she has arthritis, ankle buckling both sides, knee replacement, hypertension, insulin resistance, prediabetes, hyperlipidemia. Her insurance does not cover weight loss medications, but would like for [place] to write a letter on her behalf.  Patient explained to me that some of this was discussed at her last visit.

## 2023-05-07 ENCOUNTER — Ambulatory Visit (INDEPENDENT_AMBULATORY_CARE_PROVIDER_SITE_OTHER): Payer: BC Managed Care – PPO | Admitting: Family Medicine

## 2023-05-08 ENCOUNTER — Ambulatory Visit (INDEPENDENT_AMBULATORY_CARE_PROVIDER_SITE_OTHER): Payer: BC Managed Care – PPO | Admitting: Family Medicine

## 2023-05-08 ENCOUNTER — Encounter (INDEPENDENT_AMBULATORY_CARE_PROVIDER_SITE_OTHER): Payer: Self-pay | Admitting: Family Medicine

## 2023-05-08 VITALS — BP 138/82 | HR 75 | Temp 98.0°F | Ht 60.0 in | Wt 307.0 lb

## 2023-05-08 DIAGNOSIS — R7303 Prediabetes: Secondary | ICD-10-CM

## 2023-05-08 DIAGNOSIS — Z6841 Body Mass Index (BMI) 40.0 and over, adult: Secondary | ICD-10-CM | POA: Diagnosis not present

## 2023-05-08 DIAGNOSIS — E559 Vitamin D deficiency, unspecified: Secondary | ICD-10-CM

## 2023-05-08 DIAGNOSIS — R7309 Other abnormal glucose: Secondary | ICD-10-CM | POA: Diagnosis not present

## 2023-05-08 DIAGNOSIS — Z9884 Bariatric surgery status: Secondary | ICD-10-CM | POA: Diagnosis not present

## 2023-05-08 NOTE — Progress Notes (Signed)
Office: [phone]  /  Fax: [phone]  WEIGHT SUMMARY AND BIOMETRICS  Starting Date: 01/08/23  Starting Weight: 305lb   Weight Lost Since Last Visit: 0lb   Vitals Temp: 98 F (36.7 C) BP: 138/82 Pulse Rate: 75 SpO2: 99 %   Body Composition  Body Fat %: 66.6 % Fat Mass (lbs): 204.8 lbs Muscle Mass (lbs): 97.6 lbs Visceral Fat Rating : 30    HPI  Chief Complaint: OBESITY  Sarah Dodson is here to discuss her progress with her obesity treatment plan. She is on the the Category 3 Plan and states she is following her eating plan approximately 25 % of the time. She states she is exercising 0 minutes 0 times per week.   Interval History:  Since last office visit she is up 6 lb She has not been as active with the summer heat She had previously been going to the gym   She has a net weight gain of 2 lb in 3 mos She brings Premier Protein shake + peanuts at work and on the weekend, she is having a brunch and dinner On work days, she eats lunch - leftovers salmon, brussel sprouts, carrots, no afternoon snacks, leftovers for dinner or picks up dinner from the grocery store, less often eats dinner out Usually falling asleep early Craving sweets and is working on improving sleep at night Ozempic was denied by insurance  Pharmacotherapy: metformin XR 500 mg once daily for PDM  PHYSICAL EXAM:  Blood pressure 138/82, pulse 75, temperature 98 F (36.7 C), height 5' (1.524 m), weight (!) 307 lb (139.3 kg), SpO2 99%. Body mass index is 59.96 kg/m.  General: She is overweight, cooperative, alert, well developed, and in no acute distress. PSYCH: Has normal mood, affect and thought process.   Lungs: Normal breathing effort, no conversational dyspnea.   ASSESSMENT AND PLAN  TREATMENT PLAN FOR OBESITY:  Recommended Dietary Goals  Sarah Dodson is currently in the action stage of change. As such, her goal is to continue weight management plan. She has agreed to the Category 3  Plan.  Behavioral Intervention  We discussed the following Behavioral Modification Strategies today: increasing lean protein intake, decreasing simple carbohydrates , increasing vegetables, increasing lower glycemic fruits, increasing fiber rich foods, increasing water intake, work on meal planning and preparation, keeping healthy foods at home, continue to practice mindfulness when eating, and planning for success.  Additional resources provided today: NA  Recommended Physical Activity Goals  Sarah Dodson has been advised to work up to 150 minutes of moderate intensity aerobic activity a week and strengthening exercises 2-3 times per week for cardiovascular health, weight loss maintenance and preservation of muscle mass.   She has agreed to [employer] strengthening exercises with a goal of 2-3 sessions a week   Pharmacotherapy changes for the treatment of obesity: none  ASSOCIATED CONDITIONS ADDRESSED TODAY  Prediabetes Assessment & Plan: Lab Results  Component Value Date   HGBA1C 5.8 (H) 01/08/2023   Doing well on metformin XR 500 mg once daily with food with only occasional loose stools Has not seen any reduction in hunger or cravings Continues to struggle with snacking on refined carbs and sweets Has been less physically active this summer  Continue metformin XR 500 mg daily Update labs today Resume prescribed dietary plan  Orders: -     Comprehensive metabolic panel -     Vitamin B12 -     Hemoglobin A1c -     Insulin, random  Vitamin D deficiency Assessment &  Plan: Last vitamin D Lab Results  Component Value Date   VD25OH 36.3 01/08/2023   Doing well on RX vitamin D weekly Energy level has been fair Denies adverse SE  Update level today  Orders: -     VITAMIN D 25 Hydroxy (Vit-D Deficiency, Fractures)  Morbid obesity with starting BMI 59  BMI 50.0-59.9, adult (HCC)  S/P laparoscopic sleeve gastrectomy Assessment & Plan: She has little restriction from her VSG  done at [facility] Oct 2021 She is taking a MVI daily She is down 15 lb from her pre op weight She denies GERD, N/V/C  We discussed the option of revision bariatric surgery from VSG to RYGB Questions answered Information provided to contact [facility] for insurance coverage questions       She was informed of the importance of frequent follow up visits to maximize her success with intensive lifestyle modifications for her multiple health conditions.   ATTESTASTION STATEMENTS:  Reviewed by clinician on day of visit: allergies, medications, problem list, medical history, surgical history, family history, social history, and previous encounter notes pertinent to obesity diagnosis.   I have personally spent 30 minutes total time today in preparation, patient care, nutritional counseling and documentation for this visit, including the following: review of clinical lab tests; review of medical tests/procedures/services.      Sarah Brink, DO DABFM, DABOM [facility] [address] [place], [place] [address] [phone]

## 2023-05-08 NOTE — Assessment & Plan Note (Signed)
Last vitamin D Lab Results  Component Value Date   VD25OH 36.3 01/08/2023   Doing well on RX vitamin D weekly Energy level has been fair Denies adverse SE  Update level today

## 2023-05-08 NOTE — Assessment & Plan Note (Signed)
She has little restriction from her VSG done at [facility] Oct 2021 She is taking a MVI daily She is down 15 lb from her pre op weight She denies GERD, N/V/C  We discussed the option of revision bariatric surgery from VSG to RYGB Questions answered Information provided to contact [facility] for insurance coverage questions

## 2023-05-08 NOTE — Assessment & Plan Note (Signed)
Lab Results  Component Value Date   HGBA1C 5.8 (H) 01/08/2023   Doing well on metformin XR 500 mg once daily with food with only occasional loose stools Has not seen any reduction in hunger or cravings Continues to struggle with snacking on refined carbs and sweets Has been less physically active this summer  Continue metformin XR 500 mg daily Update labs today Resume prescribed dietary plan

## 2023-05-12 DIAGNOSIS — Z1231 Encounter for screening mammogram for malignant neoplasm of breast: Secondary | ICD-10-CM | POA: Diagnosis not present

## 2023-05-12 DIAGNOSIS — Z6841 Body Mass Index (BMI) 40.0 and over, adult: Secondary | ICD-10-CM | POA: Diagnosis not present

## 2023-05-12 DIAGNOSIS — Z01419 Encounter for gynecological examination (general) (routine) without abnormal findings: Secondary | ICD-10-CM | POA: Diagnosis not present

## 2023-05-29 DIAGNOSIS — R7303 Prediabetes: Secondary | ICD-10-CM | POA: Diagnosis not present

## 2023-05-29 DIAGNOSIS — Z903 Acquired absence of stomach [part of]: Secondary | ICD-10-CM | POA: Diagnosis not present

## 2023-05-30 ENCOUNTER — Other Ambulatory Visit (HOSPITAL_COMMUNITY): Payer: Self-pay | Admitting: Surgery

## 2023-05-30 ENCOUNTER — Ambulatory Visit: Payer: Self-pay | Admitting: Surgery

## 2023-05-30 DIAGNOSIS — Z903 Acquired absence of stomach [part of]: Secondary | ICD-10-CM

## 2023-06-05 ENCOUNTER — Encounter (INDEPENDENT_AMBULATORY_CARE_PROVIDER_SITE_OTHER): Payer: Self-pay | Admitting: Family Medicine

## 2023-06-05 ENCOUNTER — Ambulatory Visit (INDEPENDENT_AMBULATORY_CARE_PROVIDER_SITE_OTHER): Payer: BC Managed Care – PPO | Admitting: Family Medicine

## 2023-06-05 VITALS — BP 127/69 | HR 90 | Temp 98.5°F | Ht 60.0 in | Wt 306.0 lb

## 2023-06-05 DIAGNOSIS — E559 Vitamin D deficiency, unspecified: Secondary | ICD-10-CM | POA: Diagnosis not present

## 2023-06-05 DIAGNOSIS — E65 Localized adiposity: Secondary | ICD-10-CM

## 2023-06-05 DIAGNOSIS — Z6841 Body Mass Index (BMI) 40.0 and over, adult: Secondary | ICD-10-CM

## 2023-06-05 DIAGNOSIS — R7303 Prediabetes: Secondary | ICD-10-CM

## 2023-06-05 MED ORDER — VITAMIN D (ERGOCALCIFEROL) 1.25 MG (50000 UNIT) PO CAPS
50000.0000 [IU] | ORAL_CAPSULE | ORAL | 0 refills | Status: DC
Start: 2023-06-05 — End: 2023-07-15

## 2023-06-05 NOTE — Progress Notes (Signed)
Sarah Dodson, D.O.  ABFM, ABOM Specializing in Clinical Bariatric Medicine  Office located at: [address] [place], [place]  [address]     Assessment and Plan:   Medications Discontinued During This Encounter  Medication Reason   Vitamin D, Ergocalciferol, (DRISDOL) 1.25 MG (50000 UNIT) CAPS capsule Reorder     Meds ordered this encounter  Medications   Vitamin D, Ergocalciferol, (DRISDOL) 1.25 MG (50000 UNIT) CAPS capsule    Sig: Take 1 capsule (50,000 Units total) by mouth every 7 (seven) days.    Dispense:  5 capsule    Refill:  0     Prediabetes Assessment & Plan: Lab Results  Component Value Date   HGBA1C 5.8 (H) 05/08/2023   HGBA1C 5.8 (H) 01/08/2023   INSULIN 19.9 05/08/2023   INSULIN 15.4 01/08/2023    Lab Results  Component Value Date   CREATININE 0.75 05/08/2023   BUN 18 05/08/2023   NA 142 05/08/2023   K 4.8 05/08/2023   CL 102 05/08/2023   CO2 26 05/08/2023      Component Value Date/Time   PROT 7.0 05/08/2023 0753   ALBUMIN 4.1 05/08/2023 0753   AST 21 05/08/2023 0753   ALT 20 05/08/2023 0753   ALKPHOS 97 05/08/2023 0753   BILITOT 0.4 05/08/2023 0753    Lab Results  Component Value Date   VITAMINB12 494 05/08/2023    Prediabetes treated with Metformin-XR 500 mg 24 hr, tolerating well-denies GI upset. Pt does report having cravings for carbs. A1c has not changed from prior. Insulin has worsened from 15.4 to 19.9. Kidney function, electrolytes, liver enzymes within normal limits. No concerns with B12 levels.   Will refill Metformin today -pt desires to stay at current dose. Drink at least half of your weight in ounces of water per day unless otherwise noted by one of your doctors that you must restrict water intake. Educated patient that having protein with each meal is important for stabilizing sugars as well as controlling hunger and cravings. Continue to decrease simple carbs/ sugars; increase fiber and proteins -> follow her meal  plan.     Vitamin D deficiency Assessment & Plan: Lab Results  Component Value Date   VD25OH 73.1 05/08/2023   VD25OH 36.3 01/08/2023   Pt responding well to ERGO 50,000 units once a week: Vitamin D levels are within the recommended limits of 50-70 to 80. Continue with their weight loss efforts and ERGO at current dose - Will refill today. Recheck levels in 3-4 mos.      Visceral obesity Assessment & Plan: Current visceral fat rating: 30. The visceral fat rating should be < 12 in a female  Visceral adipose tissue is a hormonally active component of total body fat. This body composition phenotype is associated with medical disorders such as metabolic syndrome, cardiovascular disease and several malignancies including prostate, breast, and colorectal cancers. Goal: Lose 7-10% of weight via prudent nutritional plan and lifestyle changes.     BMI 50.0-59.9, adult (HCC) - current BMI 59.76 Morbid obesity with starting BMI 59 Assessment & Plan: Since last office visit on 05/08/23 patient's muscle mass has increased by 4 lb. Fat mass has decreased by 5.4 lb. Counseling done on how various foods will affect these numbers and how to maximize success  Total lbs lost to date: + 1 lbs  Total weight loss percentage to date: + 0.33%   Meal Plan: Begin journaling 1400 calories & 90++ grams protein with the Category 3  meal plan as a guide.   I discussed with pt the potential benefits of tracking the foods they eat and drink. Journaling can help one further understand their eating habits and patterns and it's a great tool to make more conscious meal choices throughout the day.    Behavioral Intervention Additional resources provided today:  Recipes Handout Evidence-based interventions for health behavior change were utilized today including the discussion of self monitoring techniques, problem-solving barriers and SMART goal setting techniques.   Regarding patient's less desirable eating habits and  patterns, we employed the technique of small changes.  Pt will specifically work on: increasing her lean protein intake & journaling her intake/bringing in food log for next visit.    FOLLOW UP: Return in about 4 weeks (around 07/03/2023). She was informed of the importance of frequent follow up visits to maximize her success with intensive lifestyle modifications for her multiple health conditions.  Subjective:   Chief complaint: Obesity Sarah Dodson is here to discuss her progress with her obesity treatment plan. She is on the Category 3 Plan and states she is following her eating plan approximately 25% of the time. She states she is doing resistance training at the gym + pool 60 minutes, 2 days a wk.   Interval History:  Sarah Dodson is here for a follow up office visit. This is my first time meeting with Sarah Dodson; she typically sees Dr.Bowen. Reports not doing well with the meal plan. Notes that it's difficult to eat all the protein on the meal plan. Additionally, she dislikes the eggs, cottage cheese, and milk. For lunch typically has foods like salmon & brussels sprout + carrots. Sometimes has BBQ pork sandwiches with keto bread. She's preparing for her second weight loss surgery. Had a gastric sleeve 3 yrs ago.   Pharmacotherapy for weight loss: She is currently taking  Metformin XR 500 mg 24 hr tablet  for medical weight loss.  Denies side effects.    Review of Systems:  Pertinent positives were addressed with patient today.  Reviewed by clinician on day of visit: allergies, medications, problem list, medical history, surgical history, family history, social history, and previous encounter notes.  Weight Summary and Biometrics   Weight Lost Since Last Visit: 1lb  Weight Gained Since Last Visit: 0lb   Vitals Temp: 98.5 F (36.9 C) BP: 127/69 Pulse Rate: 90 SpO2: 99 %   Anthropometric Measurements Height: 5' (1.524 m) Weight: (!) 306 lb (138.8 kg) BMI (Calculated):  59.76 Weight at Last Visit: 307lb Weight Lost Since Last Visit: 1lb Weight Gained Since Last Visit: 0lb Starting Weight: 305lb Total Weight Loss (lbs): 0 lb (0 kg)   Body Composition  Body Fat %: 65.1 % Fat Mass (lbs): 199.4 lbs Muscle Mass (lbs): 101.6 lbs Visceral Fat Rating : 30   Other Clinical Data Fasting: no Labs: no Today's Visit #: 7 Starting Date: 01/08/23   Objective:   PHYSICAL EXAM: Blood pressure 127/69, pulse 90, temperature 98.5 F (36.9 C), height 5' (1.524 m), weight (!) 306 lb (138.8 kg), SpO2 99%. Body mass index is 59.76 kg/m.  General: Well Developed, well nourished, and in no acute distress.  HEENT: Normocephalic, atraumatic Skin: Warm and dry, cap RF less 2 sec, good turgor Chest:  Normal excursion, shape, no gross abn Respiratory: speaking in full sentences, no conversational dyspnea NeuroM-Sk: Ambulates w/o assistance, moves * 4 Psych: A and O *3, insight good, mood-full  DIAGNOSTIC DATA REVIEWED:  BMET    Component Value Date/Time  NA 142 05/08/2023 0753   K 4.8 05/08/2023 0753   CL 102 05/08/2023 0753   CO2 26 05/08/2023 0753   GLUCOSE 90 05/08/2023 0753   GLUCOSE 84 07/18/2020 1451   BUN 18 05/08/2023 0753   CREATININE 0.75 05/08/2023 0753   CALCIUM 9.7 05/08/2023 0753   GFRNONAA >60 07/18/2020 1451   GFRAA >60 04/04/2017 1413   Lab Results  Component Value Date   HGBA1C 5.8 (H) 05/08/2023   HGBA1C 5.8 (H) 01/08/2023   Lab Results  Component Value Date   INSULIN 19.9 05/08/2023   INSULIN 15.4 01/08/2023   Lab Results  Component Value Date   TSH 2.410 01/08/2023   CBC    Component Value Date/Time   WBC 7.2 01/08/2023 1038   WBC 11.0 (H) 07/25/2020 0447   RBC 4.97 01/08/2023 1038   RBC 4.41 07/25/2020 0447   HGB 14.2 01/08/2023 1038   HCT 43.3 01/08/2023 1038   PLT 356 01/08/2023 1038   MCV 87 01/08/2023 1038   MCH 28.6 01/08/2023 1038   MCH 28.3 07/25/2020 0447   MCHC 32.8 01/08/2023 1038   MCHC 32.9  07/25/2020 0447   RDW 13.0 01/08/2023 1038   Iron Studies    Component Value Date/Time   IRON 82 01/08/2023 1038   TIBC 268 01/08/2023 1038   FERRITIN 193 (H) 01/08/2023 1038   IRONPCTSAT 31 01/08/2023 1038   Lipid Panel     Component Value Date/Time   CHOL 191 01/08/2023 1038   TRIG 98 01/08/2023 1038   HDL 59 01/08/2023 1038   LDLCALC 114 (H) 01/08/2023 1038   Hepatic Function Panel     Component Value Date/Time   PROT 7.0 05/08/2023 0753   ALBUMIN 4.1 05/08/2023 0753   AST 21 05/08/2023 0753   ALT 20 05/08/2023 0753   ALKPHOS 97 05/08/2023 0753   BILITOT 0.4 05/08/2023 0753      Component Value Date/Time   TSH 2.410 01/08/2023 1038   Nutritional Lab Results  Component Value Date   VD25OH 73.1 05/08/2023   VD25OH 36.3 01/08/2023    Attestations:   Patient was in the office today and time spent on visit including pre-visit chart review and post-visit care/coordination of care and electronic medical record documentation was 40 minutes. 50% of that time was in face to face counseling of this patient's medical condition(s) and providing education on treatment options to always include the first-line treatment of diet and lifestyle modification.  I, Special Randolm Idol, acting as a Stage manager for [employer], DO., have compiled all relevant documentation for today's office visit on behalf of Thomasene Lot, DO, while in the presence of [employer], DO.  I have reviewed the above documentation for accuracy and completeness, and I agree with the above. Sarah Dodson, D.O.  The 21st Century Cures Act was signed into law in 2016 which includes the topic of electronic health records.  This provides immediate access to information in MyChart.  This includes consultation notes, operative notes, office notes, lab results and pathology reports.  If you have any questions about what you read please let us know at your next visit so we can discuss your concerns and take  corrective action if need be.  We are right here with you.

## 2023-06-06 ENCOUNTER — Other Ambulatory Visit: Payer: Self-pay

## 2023-06-06 ENCOUNTER — Encounter (HOSPITAL_COMMUNITY)
Admission: RE | Admit: 2023-06-06 | Discharge: 2023-06-06 | Disposition: A | Discharge status: Home / Self Care | Payer: BC Managed Care – PPO | Source: Ambulatory Visit | Attending: Surgery | Admitting: Surgery

## 2023-06-06 ENCOUNTER — Ambulatory Visit (HOSPITAL_COMMUNITY)
Admission: RE | Admit: 2023-06-06 | Discharge: 2023-06-06 | Disposition: A | Discharge status: Home / Self Care | Payer: BC Managed Care – PPO | Source: Ambulatory Visit | Attending: Surgery | Admitting: Surgery

## 2023-06-06 DIAGNOSIS — Z01818 Encounter for other preprocedural examination: Secondary | ICD-10-CM | POA: Diagnosis not present

## 2023-06-06 DIAGNOSIS — Z903 Acquired absence of stomach [part of]: Secondary | ICD-10-CM | POA: Diagnosis not present

## 2023-06-08 DIAGNOSIS — R7309 Other abnormal glucose: Secondary | ICD-10-CM | POA: Diagnosis not present

## 2023-06-26 ENCOUNTER — Encounter: Payer: Self-pay | Admitting: Dietician

## 2023-06-26 ENCOUNTER — Encounter: Payer: BC Managed Care – PPO | Attending: Surgery | Admitting: Dietician

## 2023-06-26 VITALS — Ht 61.0 in | Wt 311.1 lb

## 2023-06-26 DIAGNOSIS — Z713 Dietary counseling and surveillance: Secondary | ICD-10-CM | POA: Insufficient documentation

## 2023-06-26 DIAGNOSIS — R7303 Prediabetes: Secondary | ICD-10-CM | POA: Insufficient documentation

## 2023-06-26 DIAGNOSIS — Z6841 Body Mass Index (BMI) 40.0 and over, adult: Secondary | ICD-10-CM | POA: Insufficient documentation

## 2023-06-26 DIAGNOSIS — E669 Obesity, unspecified: Secondary | ICD-10-CM

## 2023-06-26 NOTE — Progress Notes (Signed)
Nutrition Assessment for Bariatric Surgery: Pre-Surgery Behavioral and Nutrition Intervention Program   Medical Nutrition Therapy  Appt Start Time: 8:46    End Time: 9:44  Patient was seen on 06/26/2023 for Pre-Operative Nutrition Assessment. Purpose of todays visit  enhance perioperative outcomes along with a healthy weight maintenance   Referral stated Supervised Weight Loss (SWL) visits needed: 0  Pt completed visits.   Pt has cleared nutrition requirements.   Planned surgery: conversion from sleeve to duodenal switch Pt expectation of surgery: lose more weight   NUTRITION ASSESSMENT   Anthropometrics  Start weight at [facility]: 311.1 lbs (date: 06/26/2023)  Height: 61 in BMI: 58.78 kg/m2     Clinical   Pharmacotherapy: History of weight loss medication used: Wegovy, saxenda   Medical hx: sleeve gastrectomy, HTN, asthma, hypercholesterolemia, obesity Medications: hydroclorizide, pantimazole, advair, clutin, vit D, iron supplement, multivitamin, calcium, metformin Labs: A1c 5.8, ferritin, 193 Notable signs/symptoms: none noted Any previous deficiencies? No  Evaluation of Nutritional Deficiencies: Micronutrient Nutrition Focused Physical Exam: Hair: No issues observed Eyes: No issues observed Mouth: No issues observed Neck: No issues observed Nails: No issues observed Skin: No issues observed  Lifestyle & Dietary Hx  Pt states she is going to have a sleep study done in a couple of weeks.   Current Physical Activity Recommendations state 150 minutes per week of moderate to vigorous movement including Cardio and 1-2 days of resistance activities as well as flexibility/balance activities:  Pts current physical activity: 2-3 days a week [employer], pool or light weights for 60 minutes, with 75% recommendation reached   Sleep Hygiene: duration and quality: pretty good, sleep study scheduled in a few weeks.  Current Patient Perceived Stress Level as stated by pt on a scale of  1-10:  0-1       Stress Management Techniques: walk away for a little while  According to the Dietary Guidelines for Americans Recommendation: equivalent 1.5-2 cups fruits per day, equivalent 2-3 cups vegetables per day and at least half all grains whole  Fruit servings per day (on average): 1, meeting 50% recommendation  Non-starchy vegetable servings per day (on average): 1-2, meeting 50% recommendation  Whole Grains per day (on average): 0  Number of meals missed/skipped per week out of 21: 7  24-Hr Dietary Recall First Meal: protein shake Snack: peanuts Second Meal: meat and vegetable or pita chips with string cheese Snack: peanuts or fruit or small bag of chips Third Meal: lean meat and non-starchy vegetable with a starchy vegetable Snack: cake or something sweet or dark chocolate covered almonds or protein bar Beverages: water, or water with flavorings, or hibiscus tea  Alcoholic beverages per week: 0   Estimated Energy Needs Calories: 1500  NUTRITION DIAGNOSIS  Overweight/obesity ([place]-3.3) related to past poor dietary habits and physical inactivity as evidenced by patient w/ planned conversion from sleeve to duodenal switch surgery following dietary guidelines for continued weight loss.    NUTRITION INTERVENTION  Nutrition counseling (C-1) and education (E-2) to facilitate bariatric surgery goals.  Educated pt on micronutrient deficiencies post-surgery and behavioral/dietary strategies to start in order to mitigate that risk   Behavioral and Dietary Interventions Pre-Op Goals Reviewed with the Patient Nutrition: Healthy Eating Behaviors Switch to non-caloric, non-carbonated and non-caffeinated beverages such as  water, unsweetened tea, Crystal Light and zero calorie beverages (aim for 64 oz. per day) Cut out grazing between meals or at night  Find a protein shake you like Eat every 3-5 hours  Eliminate distractions while eating (TV, computer, reading, driving,  texting) Take 40-98 minutes to eat a meal  Decrease high sugar foods/decrease high fat/fried foods Eliminate alcoholic beverages Increase protein intake (eggs, fish, chicken, yogurt) before surgery Eat non starchy vegetables 2 times a day 7 days a week Eat complex carbohydrates such as whole grains and fruits   Behavioral Modification: Physical Activity Increase my usual daily activity (use stairs, park farther, etc.) Engage in _______________________  activity  _______ minutes ______ times per week  Other:    _________________________________________________________________     Problem Solving I will think about my usual eating patterns and how to tweak them How can my friends and family support me Barriers to starting my changes Learn and understand appetite verses hunger   Healthy Coping Allow for ___________ activities per week to help me manage stress Reframe negative thoughts I will keep a picture of someone or something that is my inspiration & look at it daily   Monitoring  Weigh myself once a week  Measure my progress by monitoring how my clothes fit Keep a food record of what I eat and drink for the next ________ (time period) Take pictures of what I eat and drink for the next ________ (time period) Use an app to count steps/day for the next_______ (time period) Measure my progress such as increased energy and more restful sleep Monitor your acid reflux and bowel habits, are they getting better?   *Goals that are bolded indicate the pt would like to start working towards these  Handouts Provided Include  Bariatric Surgery handouts (Nutrition Visits, Pre Surgery Behavioral Change Goals, Protein Shakes Brands to Choose From, Vitamins & Mineral Supplementation)  Learning Style & Readiness for Change Teaching method utilized: Visual, Auditory, and hands on  Demonstrated degree of understanding via: Teach Back  Readiness Level: preparation  Barriers to learning/adherence  to lifestyle change: nothing identified  RD's Notes for Next Visit    MONITORING & EVALUATION Dietary intake, weekly physical activity, body weight, and preoperative behavioral change goals   Next Steps  Pt has completed visits. No further supervised visits required/recommended. Patient is to follow up at [facility] for pre-op class, >3 weeks prior to scheduled surgery.

## 2023-07-07 ENCOUNTER — Ambulatory Visit (INDEPENDENT_AMBULATORY_CARE_PROVIDER_SITE_OTHER): Payer: BC Managed Care – PPO | Admitting: Family Medicine

## 2023-07-07 ENCOUNTER — Ambulatory Visit (INDEPENDENT_AMBULATORY_CARE_PROVIDER_SITE_OTHER): Payer: BC Managed Care – PPO | Admitting: Neurology

## 2023-07-07 ENCOUNTER — Encounter: Payer: Self-pay | Admitting: Neurology

## 2023-07-07 DIAGNOSIS — R0601 Orthopnea: Secondary | ICD-10-CM | POA: Insufficient documentation

## 2023-07-07 DIAGNOSIS — E66813 Obesity, class 3: Secondary | ICD-10-CM | POA: Insufficient documentation

## 2023-07-07 DIAGNOSIS — M1711 Unilateral primary osteoarthritis, right knee: Secondary | ICD-10-CM | POA: Diagnosis not present

## 2023-07-07 NOTE — Addendum Note (Signed)
Addended by: Melvyn Novas on: 07/07/2023 01:54 PM   Modules accepted: Orders

## 2023-07-07 NOTE — Patient Instructions (Signed)
Safe Surgery and Sleep Apnea Sleep apnea is a condition in which breathing pauses or becomes shallow during sleep. Most people with the condition are not aware that they have it. Your health care providers need to know whether or not you have sleep apnea, especially if you are having surgery. Sleep apnea can increase your risk of complications during and after surgery. Tell a health care provider about: Any medical conditions you have, especially if you have sleep apnea. Any allergies you have. All medicines you are taking, including vitamins, herbs, eye drops, creams, and over-the-counter medicines. Any blood disorders you have. Any problems you or family members have had with anesthetic medicines. Any surgeries you have had. Whether you are pregnant or may be pregnant. What are the risks of having surgery? Untreated sleep apnea increases the risk for certain complications during and after surgery. This is because when you have sleep apnea, your airways are more sensitive to medicines used during surgery. The airways can collapse and block the flow of air.  Having untreated sleep apnea can increase your risk for: A longer stay in the recovery room or hospital. Breathing difficulties such as low oxygen levels after surgery. Increased pain after surgery. Irregular heart rhythms. Stroke. Heart attack. You and your health care provider can take steps to help prevent these and other complications. What happens before the surgery? Sleep apnea screening Sleep apnea screening is a series of questions that determine if you are at risk for sleep apnea. Before you have surgery, get screened for sleep apnea and talk with your surgeon and primary health care provider about your results. Screening usually involves answering questions about your sleep quality. Ask your health care provider if you can be screened, or take a screening test yourself. You can find these tests online at the American Sleep Apnea  Association website. Some questions you may be asked include: Do you snore? Is your sleep restless? Do you have daytime sleepiness? Has a partner or spouse told you that you stop breathing during sleep? Have you had trouble concentrating or memory loss? Answer these questions honestly. If a screening test is positive, this means you are at risk for the condition. Further testing may be needed to confirm a diagnosis of sleep apnea. General instructions Talk to your health care provider about your individual risks based on your screening results and the type of surgery you will be having. If you have a sleep apnea device (positive airway pressure device), wear it as prescribed. If you have not been wearing your device, talk with your health care provider about why you have not been wearing it. There are ways to improve your use of the device, such as: Adjusting the mask. Adding humidified air. Getting treatment for nasal congestion. Do not use any products that contain nicotine or tobacco. These products include cigarettes, chewing tobacco, and vaping devices, such as e-cigarettes. If you need help quitting, ask your health care provider. Do not drink alcohol the day before or after your surgery because it can worsen your sleep apnea. What happens on the day of surgery? If told by your health care provider, bring your sleep apnea device with you. Wear your sleep apnea device when you are sleeping during your hospital stay, or as told by your health care provider. Ask your health care provider what special considerations will be taken during and after your surgery. What can I expect after the surgery? You may need to be given extra oxygen and wear a continuous  oxygen monitor (pulse oximetry). For your safety, you may need to stay in the recovery room or hospital for longer than usual. Follow these instructions at home: Medicines Avoid using sleep medicines unless they are prescribed by a health  care provider who is aware of the results of your sleep apnea screening. Avoid using sleep medicines while taking opioid pain medicine. Limit your use of opioid pain medicines as much as possible. Ask your health care provider what is a safe amount to use. Ask about using pain medicines that do not affect your breathing, such as NSAIDs or acetaminophen. General instructions  If your health care provider approves, raise the head of your bed or lie on your side. Do not lie flat on your back. Follow instructions from your health care provider about wearing your sleep device: Anytime you are sleeping, including during daytime naps. While taking prescription pain medicines, sleeping medicines, or medicines that make you drowsy. If you will be going home right after the procedure, plan to have a responsible adult care for you for the time you are told. This is important. Where to find more information For more information about sleep apnea screening and healthy sleep, visit these websites: Centers for Disease Control and Prevention: [employer]Ex[employer].br American Sleep Apnea Association: www.sleepapnea.org Contact a health care provider if: You have sleep apnea or think you may be at risk for sleep apnea, and you are scheduled for surgery. Get help right away if: You have trouble breathing. You are very drowsy and cannot stay awake. You are told that you have pauses in your breathing during sleep after surgery. You have chest pain. You have a fast heartbeat. These symptoms may represent a serious problem that is an emergency. Do not wait to see if the symptoms will go away. Get medical help right away. Call your local emergency services (911 in the [place] Do not drive yourself to the hospital. Summary Your health care providers need to know whether or not you have sleep apnea, especially if you are having surgery. If you have sleep apnea, you are at an increased risk for complications during surgery. You  and your health care provider can take precautions to help prevent complications. If you have sleep apnea, make sure to tell your health care provider and anesthesia specialist. This information is not intended to replace advice given to you by your health care provider. Make sure you discuss any questions you have with your health care provider. Document Revised: 09/01/2020 Document Reviewed: 09/01/2020 Elsevier Patient Education  2024 [employer].

## 2023-07-07 NOTE — Progress Notes (Signed)
SLEEP MEDICINE CLINIC    Provider:  Melvyn Novas, MD  Primary Care Physician:  Thana Ates, MD [address] [place] [address]     Referring Provider: Quentin Ore, Md [phone] [place],  [place] [address]          Chief Complaint according to patient   Patient presents with:     New Patient (Initial Visit)           HISTORY OF PRESENT ILLNESS:  Sarah Dodson is a 62 y.o. female patient who is seen upon referral on 07/07/2023 from Dr. Dossie Dodson ,MD  for a pre bariatric surgery sleep test.  Chief concern according to patient :  " I hope to undergo bariatric  surgery- I had a sleeve surgery 3 years ago, lost 70 lbs and gained back 40 lbs, and I want to undergo switch surgery now. " "I couldn't get a bypass surgery because I relied on Diclofenac for ankle pain-" .  I have the pleasure of seeing Sarah Dodson on 07/07/23 a right-handed female with a possible sleep disorder.    The patient never had a sleep study.   Sleep relevant medical history: Nocturia 1-2 times, no Sleep walking, no Night terrors, no ENT surgeries/  Tonsillectomy, wisdom teeth extraction. NO no cervical spine /deviated septum or other trama or surgery.     Family medical /sleep history: no  other family member on CPAP with OSA.  Social history:  Patient is living alone, no children, 2 dogs.  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 The patient currently works in daytime, 7-16 hours.  Tobacco use: none - quit 2010 after 30 years-  ETOH use : rare ,  Caffeine intake in form of Coffee( /) Soda( /) Tea ( green , but 1/week) but dark chocolate-  Exercise in form of weight training swimming.  Yard work.  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       Sleep habits are as follows:  The patient's dinner time is between 6-7 PM. The patient goes to bed at 9 and is asleep at 10  PM and continues to sleep for  6 hours, wakes for 1-2 bathroom breaks. She sleeps postnasal drip. with the TV on.  The preferred sleep position is reclined , with the support of 2 pillows, 25 degrees- in  chair. GERD and sinus drainage are less bothersome. Dreams are reportedly frequent/vivid.  The patient wakes up spontaneously before her alarm at 4.30 , 5  AM is the usual rise time. She reports mostly feeling refreshed or restored in AM, with symptoms such as dry mouth, and residual fatigue.  Naps are taken rarely and when only on weekends -lasting from 60 to 120 minutes and are refreshing.      Review of Systems: Out of a complete 14 system review, the patient complains of only the following symptoms, and all other reviewed systems are negative.:  Fatigue, sleepiness , snoring, fragmented sleep, Insomnia, RLS, Nocturia    How likely are you to doze in the following situations: 0 = not likely, 1 = slight chance, 2 = moderate chance, 3 = high chance   Sitting and Reading? Watching Television? Sitting inactive in a public place (theater or meeting)? As a passenger in a car for an hour without a break? Lying down in the afternoon when circumstances permit? Sitting and talking to someone? Sitting quietly after lunch  without alcohol? In a car, while stopped for a few minutes in traffic?   Total = 3/ 24 points   FSS endorsed at 23/ 63 points.  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[employer], [employer], Artist  Tobacco Use   Smoking status: Former    Current packs/day: 0.00    Average packs/day: 1 pack/day for 35.0 years (35.0 ttl pk-yrs)    Types: Cigarettes    Start date: 01/08/1974    Quit date: 01/08/2009    Years since quitting: 14.5   Smokeless tobacco: Never   Vaping Use   Vaping status: Never Used  Substance and Sexual Activity   Alcohol use: No   Drug use: No   Sexual activity: Not on file  Other Topics Concern   Not on file  Social History Narrative   Not on file   Social Determinants of Health   Financial Resource Strain: Not on file  Food Insecurity: Not on file  Transportation Needs: Not on file  Physical Activity: Not on file  Stress: Not on file  Social Connections: Not on file    Family History  Problem Relation Age of Onset   Hypertension Mother    Obesity Mother    Sudden death Father     Past Medical History:  Diagnosis Date   Arthritis    Asthma    Bronchitis    Constipation    Depression    Esophagitis    GERD (gastroesophageal reflux disease)    Hypertension    Joint pain    Lactose intolerance    Obesity    PONV (postoperative nausea and vomiting)    nausea with the LEFT ankle surgery   SOB (shortness of breath)     Past Surgical History:  Procedure Laterality Date   ANKLE ARTHROSCOPY Left    left 2014/ right 2016   ANKLE ARTHROSCOPY Right 01/11/2015   Procedure: RIGHT ANKLE ARTHROSCOPY;  Surgeon: Nadara Mustard, MD;  Location: [facility] OR;  Service: Orthopedics;  Laterality: Right;   BIOPSY  08/06/2022   Procedure: BIOPSY;  Surgeon: Charlott Rakes, MD;  Location: [facility];  Service: Gastroenterology;;   BIOPSY BREAST Right    COLONOSCOPY WITH PROPOFOL N/A 08/06/2022   Procedure: COLONOSCOPY WITH PROPOFOL;  Surgeon: Charlott Rakes, MD;  Location: [facility];  Service: Gastroenterology;  Laterality: N/A;   HYSTEROSCOPY WITH D & C N/A 09/05/2016   Procedure: DILATATION AND CURETTAGE /HYSTEROSCOPY;  Surgeon: Richarda Overlie, MD;  Location: [facility];  Service: Gynecology;  Laterality: N/A;   LAPAROSCOPIC GASTRIC SLEEVE RESECTION N/A 07/24/2020   Procedure: LAPAROSCOPIC GASTRIC SLEEVE RESECTION; REPAIR OF HIATAL HERNIA;  Surgeon: Ovidio Kin, MD;  Location: [facility];  Service: General;  Laterality:  N/A;   POLYPECTOMY  08/06/2022   Procedure: POLYPECTOMY;  Surgeon: Charlott Rakes, MD;  Location: [facility];  Service: Gastroenterology;;   TOTAL KNEE ARTHROPLASTY Right 04/16/2017   Procedure: RIGHT TOTAL KNEE ARTHROPLASTY;  Surgeon: Nadara Mustard, MD;  Location: [facility] OR;  Service: Orthopedics;  Laterality: Right;   UPPER GI ENDOSCOPY N/A 07/24/2020   Procedure: UPPER GI ENDOSCOPY;  Surgeon: Ovidio Kin, MD;  Location: [facility];  Service: General;  Laterality: N/A;  Current Outpatient Medications on File Prior to Visit  Medication Sig Dispense Refill   albuterol (PROVENTIL HFA;VENTOLIN HFA) 108 (90 BASE) MCG/ACT inhaler Inhale 2 puffs into the lungs every 6 (six) hours as needed for wheezing or shortness of breath.     Calcium Carbonate-Vit D-Min (CALTRATE 600+D PLUS MINERALS) 600-800 MG-UNIT CHEW Chew 1 each by mouth 3 (three) times daily.     fluticasone (FLONASE) 50 MCG/ACT nasal spray Place 1 spray into both nostrils daily as needed for allergies or rhinitis.     Fluticasone-Salmeterol (ADVAIR) 250-50 MCG/DOSE AEPB Inhale 1 puff into the lungs 2 (two) times daily.     hydrochlorothiazide (HYDRODIURIL) 12.5 MG tablet Take 25 mg by mouth every morning.     loratadine (CLARITIN) 10 MG tablet Take 10 mg by mouth daily.     metFORMIN (GLUCOPHAGE-XR) 500 MG 24 hr tablet TAKE 1 TABLET BY MOUTH DAILY WITH SUPPER. 90 tablet 1   Multiple Vitamins-Minerals (ADULT ONE DAILY GUMMIES PO) Take 2 tablets by mouth daily. Alive Women's 50+ (Gummy)     NUTRITIONAL SUPPLEMENTS PO Take by mouth. Extra Strength Liquid  Collagen     pantoprazole (PROTONIX) 40 MG tablet Take 40 mg by mouth daily.     Vitamin D, Ergocalciferol, (DRISDOL) 1.25 MG (50000 UNIT) CAPS capsule Take 1 capsule (50,000 Units total) by mouth every 7 (seven) days. 5 capsule 0   APPLE CIDER VINEGAR PO Take by mouth. (Patient not taking: Reported on 07/07/2023)     ferrous sulfate 325 (65 FE) MG tablet Take 325 mg by mouth daily with  breakfast. (Patient not taking: Reported on 07/07/2023)     psyllium (HYDROCIL/METAMUCIL) 95 % PACK Take 1 packet by mouth daily. (Patient not taking: Reported on 07/07/2023)     Semaglutide,0.25 or 0.5MG /DOS, 2 MG/3ML SOPN Inject 0.25 mg into the skin once a week. (Patient not taking: Reported on 06/05/2023) 3 mL 0   No current facility-administered medications on file prior to visit.    Allergies  Allergen Reactions   Latex Itching     DIAGNOSTIC DATA (LABS, IMAGING, TESTING) - I reviewed patient records, labs, notes, testing and imaging myself where available.  Lab Results  Component Value Date   WBC 7.2 01/08/2023   HGB 14.2 01/08/2023   HCT 43.3 01/08/2023   MCV 87 01/08/2023   PLT 356 01/08/2023      Component Value Date/Time   NA 142 05/08/2023 0753   K 4.8 05/08/2023 0753   CL 102 05/08/2023 0753   CO2 26 05/08/2023 0753   GLUCOSE 90 05/08/2023 0753   GLUCOSE 84 07/18/2020 1451   BUN 18 05/08/2023 0753   CREATININE 0.75 05/08/2023 0753   CALCIUM 9.7 05/08/2023 0753   PROT 7.0 05/08/2023 0753   ALBUMIN 4.1 05/08/2023 0753   AST 21 05/08/2023 0753   ALT 20 05/08/2023 0753   ALKPHOS 97 05/08/2023 0753   BILITOT 0.4 05/08/2023 0753   GFRNONAA >60 07/18/2020 1451   GFRAA >60 04/04/2017 1413   Lab Results  Component Value Date   CHOL 191 01/08/2023   HDL 59 01/08/2023   LDLCALC 114 (H) 01/08/2023   TRIG 98 01/08/2023   Lab Results  Component Value Date   HGBA1C 5.8 (H) 05/08/2023   Lab Results  Component Value Date   VITAMINB12 494 05/08/2023   Lab Results  Component Value Date   TSH 2.410 01/08/2023    PHYSICAL EXAM:  Today's Vitals   07/07/23 1255  BP: 134/81  Pulse:  87  Weight: (!) 310 lb (140.6 kg)  Height: 5\' 1"  (1.549 m)   Body mass index is 58.57 kg/m.   Wt Readings from Last 3 Encounters:  07/07/23 (!) 310 lb (140.6 kg)  06/26/23 (!) 311 lb 1.6 oz (141.1 kg)  06/05/23 (!) 306 lb (138.8 kg)     Ht Readings from Last 3 Encounters:   07/07/23 5\' 1"  (1.549 m)  06/26/23 5\' 1"  (1.549 m)  06/05/23 5' (1.524 m)      General: The patient is awake, alert and appears not in acute distress. The patient is well groomed. Head: Normocephalic, atraumatic.  Neck is supple. Mallampati 3,  neck circumference:17. 25 inches . Nasal airflow patent.  Retrognathia is not seen.  Dental status: biological  Cardiovascular:  Regular rate and cardiac rhythm by pulse,  without distended neck veins. Respiratory: Lungs are clear to auscultation.  Skin:  With evidence of ankle edema, pitting.  Trunk: The patient's posture is erect.   NEUROLOGIC EXAM: The patient is awake and alert, oriented to place and time.   Memory subjective described as intact.  Attention span & concentration ability appears normal.  Speech is fluent,  without  dysarthria, dysphonia or aphasia.  Mood and affect are appropriate.   Cranial nerves: no loss of smell or taste reported  Pupils are equal and briskly reactive to light. Funduscopic exam deferred. Bifocal lenses. .  Extraocular movements in vertical and horizontal planes were intact and without nystagmus. No Diplopia. Visual fields by finger perimetry are intact. Hearing was intact to soft voice and finger rubbing.    Facial sensation intact to fine touch.  Facial motor strength is symmetric and tongue and uvula move midline.  Neck ROM : rotation, tilt and flexion extension were normal for age and shoulder shrug was symmetrical.    Motor exam:  Symmetric bulk, tone and ROM.   Normal tone without cog wheeling, symmetric grip strength .   Sensory:  Fine touch, pinprick and vibration were normal.  Proprioception tested in the upper extremities was normal.   Coordination: Rapid alternating movements in the fingers/hands were of normal speed.  The Finger-to-nose maneuver was intact without evidence of ataxia, dysmetria or tremor.   Gait and station: Patient could rise unassisted from a seated position, walked  without assistive device.  She feels insecure when stepping onto an escalator since her feet and" ankles have collapsed " Stance is of wider width/ base and the patient turned with 4 steps.  Toe and heel walk were deferred.  Deep tendon reflexes: in the  upper and lower extremities are attenuated - symmetric   Babinski response was deferred /    ASSESSMENT AND PLAN 62 y.o. year old female  here with:  Bariatric patient  referred by Dr Sarah Dodson with the goal of preoperative check up for OSA     1) OSA risk factors are plenty. History of smoking,  BMI over 50, larger neck, high grade Mallampati.   2) the patient v bariatric status pres disposes her for obesity hypoventilation and orthopnea, she sleeps in a recliner for a decade   I will order a HST which can be performed in the next 3 weeks.    I plan to follow up either personally or through our NP within 2 months ( this calendar year ) ,  I would like to thank [place], Hyman Hopes, Md [address] [place],  [place] [address] for allowing me to meet with and to take  care of this pleasant patient.   After spending a total time of  35  minutes face to face and additional time for physical and neurologic examination, review of laboratory studies,  personal review of imaging studies, reports and results of other testing and review of referral information / records as far as provided in visit,   Electronically signed by: Melvyn Novas, MD 07/07/2023 1:15 PM  [facility] Sleep Board certified by The [employer] of Sleep Medicine and Diplomate of the [employer] of Sleep Medicine. Board certified In Neurology through the ABPN, Fellow of the [employer] of Neurology.

## 2023-07-08 DIAGNOSIS — R7309 Other abnormal glucose: Secondary | ICD-10-CM | POA: Diagnosis not present

## 2023-07-09 ENCOUNTER — Ambulatory Visit (INDEPENDENT_AMBULATORY_CARE_PROVIDER_SITE_OTHER): Payer: BC Managed Care – PPO | Admitting: Licensed Clinical Social Worker

## 2023-07-09 DIAGNOSIS — F4320 Adjustment disorder, unspecified: Secondary | ICD-10-CM | POA: Diagnosis not present
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
## 2023-07-11 ENCOUNTER — Encounter (HOSPITAL_COMMUNITY): Payer: Self-pay | Admitting: Surgery

## 2023-07-13 ENCOUNTER — Other Ambulatory Visit (INDEPENDENT_AMBULATORY_CARE_PROVIDER_SITE_OTHER): Payer: Self-pay | Admitting: Family Medicine

## 2023-07-13 DIAGNOSIS — E559 Vitamin D deficiency, unspecified: Secondary | ICD-10-CM

## 2023-07-14 ENCOUNTER — Telehealth (INDEPENDENT_AMBULATORY_CARE_PROVIDER_SITE_OTHER): Payer: Self-pay | Admitting: Family Medicine

## 2023-07-14 DIAGNOSIS — E559 Vitamin D deficiency, unspecified: Secondary | ICD-10-CM

## 2023-07-14 NOTE — Telephone Encounter (Signed)
Patient called in stating she needs a refill on her Vitamin D sent to [facility] on [address]. Stated she spoke with Dr. Cathey Endow about it and would like to see Dr. Cathey Endow when she returns. Patient states she is having gastric bypass surgery.  Thank you

## 2023-07-15 MED ORDER — VITAMIN D (ERGOCALCIFEROL) 1.25 MG (50000 UNIT) PO CAPS: 50000.0000 [IU] | ORAL_CAPSULE | ORAL | 0 refills | Status: DC

## 2023-07-15 NOTE — Addendum Note (Signed)
Addended by: Seymour Bars E on: 07/15/2023 01:01 PM   Modules accepted: Orders

## 2023-07-18 ENCOUNTER — Encounter (HOSPITAL_COMMUNITY): Payer: Self-pay | Admitting: Surgery

## 2023-07-18 ENCOUNTER — Ambulatory Visit (HOSPITAL_COMMUNITY)
Admission: RE | Admit: 2023-07-18 | Discharge: 2023-07-18 | Disposition: A | Discharge status: Home / Self Care | Payer: BC Managed Care – PPO | Attending: Surgery | Admitting: Surgery

## 2023-07-18 ENCOUNTER — Ambulatory Visit (HOSPITAL_COMMUNITY): Payer: BC Managed Care – PPO

## 2023-07-18 ENCOUNTER — Encounter (HOSPITAL_COMMUNITY)
Admission: RE | Disposition: A | Discharge status: Home / Self Care | Payer: Self-pay | Source: Home / Self Care | Attending: Surgery

## 2023-07-18 ENCOUNTER — Other Ambulatory Visit: Payer: Self-pay

## 2023-07-18 DIAGNOSIS — Z6841 Body Mass Index (BMI) 40.0 and over, adult: Secondary | ICD-10-CM | POA: Insufficient documentation

## 2023-07-18 DIAGNOSIS — K2100 Gastro-esophageal reflux disease with esophagitis, without bleeding: Secondary | ICD-10-CM | POA: Insufficient documentation

## 2023-07-18 DIAGNOSIS — Z9884 Bariatric surgery status: Secondary | ICD-10-CM | POA: Diagnosis not present

## 2023-07-18 DIAGNOSIS — K219 Gastro-esophageal reflux disease without esophagitis: Secondary | ICD-10-CM | POA: Diagnosis not present

## 2023-07-18 DIAGNOSIS — Z01818 Encounter for other preprocedural examination: Secondary | ICD-10-CM | POA: Diagnosis not present

## 2023-07-18 DIAGNOSIS — K208 Other esophagitis without bleeding: Secondary | ICD-10-CM | POA: Diagnosis not present

## 2023-07-18 DIAGNOSIS — I10 Essential (primary) hypertension: Secondary | ICD-10-CM | POA: Insufficient documentation

## 2023-07-18 DIAGNOSIS — Z87891 Personal history of nicotine dependence: Secondary | ICD-10-CM | POA: Insufficient documentation

## 2023-07-18 DIAGNOSIS — J45909 Unspecified asthma, uncomplicated: Secondary | ICD-10-CM | POA: Insufficient documentation

## 2023-07-18 DIAGNOSIS — Z79899 Other long term (current) drug therapy: Secondary | ICD-10-CM | POA: Diagnosis not present

## 2023-07-18 DIAGNOSIS — K209 Esophagitis, unspecified without bleeding: Secondary | ICD-10-CM | POA: Diagnosis not present

## 2023-07-18 HISTORY — PX: BIOPSY: SHX5522

## 2023-07-18 HISTORY — PX: ESOPHAGOGASTRODUODENOSCOPY: SHX5428

## 2023-07-18 LAB — GLUCOSE, CAPILLARY: Glucose-Capillary: 85 mg/dL (ref 70–99)

## 2023-07-18 SURGERY — EGD (ESOPHAGOGASTRODUODENOSCOPY)
Anesthesia: Monitor Anesthesia Care

## 2023-07-18 MED ORDER — SODIUM CHLORIDE 0.9 % IV SOLN
INTRAVENOUS | Status: DC | PRN
Start: 2023-07-18 — End: 2023-07-18

## 2023-07-18 MED ORDER — PROPOFOL 10 MG/ML IV BOLUS
INTRAVENOUS | Status: AC
  Filled 2023-07-18: qty 20

## 2023-07-18 MED ORDER — LIDOCAINE 2% (20 MG/ML) 5 ML SYRINGE
INTRAMUSCULAR | Status: DC | PRN
Start: 2023-07-18 — End: 2023-07-18
  Administered 2023-07-18: 100 mg via INTRAVENOUS

## 2023-07-18 MED ORDER — PROPOFOL 500 MG/50ML IV EMUL
INTRAVENOUS | Status: DC | PRN
  Administered 2023-07-18: 150 ug/kg/min via INTRAVENOUS
  Administered 2023-07-18: 25 mg via INTRAVENOUS
  Administered 2023-07-18: 50 mg via INTRAVENOUS

## 2023-07-18 NOTE — Anesthesia Postprocedure Evaluation (Signed)
Anesthesia Post Note  Patient: Sarah Dodson  Procedure(s) Performed: ESOPHAGOGASTRODUODENOSCOPY (EGD) BIOPSY     Patient location during evaluation: PACU Anesthesia Type: MAC Level of consciousness: awake and alert Pain management: pain level controlled Vital Signs Assessment: post-procedure vital signs reviewed and stable Respiratory status: spontaneous breathing, nonlabored ventilation, respiratory function stable and patient connected to nasal cannula oxygen Cardiovascular status: stable and blood pressure returned to baseline Postop Assessment: no apparent nausea or vomiting Anesthetic complications: no   No notable events documented.  Last Vitals:  Vitals:   07/18/23 1250 07/18/23 1300  BP: (!) 141/61 (!) 162/57  Pulse: 87 82  Resp: (!) 21 19  Temp:    SpO2: 99% 98%    Last Pain:  Vitals:   07/18/23 1300  TempSrc:   PainSc: 0-No pain                 [place]

## 2023-07-18 NOTE — Op Note (Signed)
[facility] Patient Name: Sarah Dodson Procedure Date: 07/18/2023 MRN: [id number] Attending MD: Quentin Ore , ,  Date of Birth: 03-Dec-1960 CSN: [id number] Age: 62 Admit Type: Outpatient Procedure:                Upper GI endoscopy Indications:              Preoperative assessment for bariatric surgery to                            treat morbid obesity Providers:                Quentin Ore, Lorenza Evangelist, RN, Sunday Corn                            [place], Technician Referring MD:              Medicines:                Monitored Anesthesia Care Complications:            No immediate complications. Estimated Blood Loss:     Estimated blood loss was minimal. Procedure:                Pre-Anesthesia Assessment:                           - Prior to the procedure, a History and Physical                            was performed, and patient medications and                            allergies were reviewed. The patient is competent.                            The risks and benefits of the procedure and the                            sedation options and risks were discussed with the                            patient. All questions were answered and informed                            consent was obtained. Patient identification and                            proposed procedure were verified. Mental Status                            Examination: alert and oriented. Airway                            Examination: normal oropharyngeal airway and neck                            mobility. Respiratory Examination:  clear to                            auscultation. CV Examination: normal. Prophylactic                            Antibiotics: The patient does not require                            prophylactic antibiotics. Prior Anticoagulants: The                            patient has taken no anticoagulant or antiplatelet                            agents. ASA  Grade Assessment: II - A patient with                            mild systemic disease. After reviewing the risks                            and benefits, the patient was deemed in                            satisfactory condition to undergo the procedure.                            The anesthesia plan was to use monitored anesthesia                            care (MAC). Immediately prior to administration of                            medications, the patient was re-assessed for                            adequacy to receive sedatives. The heart rate,                            respiratory rate, oxygen saturations, blood                            pressure, adequacy of pulmonary ventilation, and                            response to care were monitored throughout the                            procedure. The physical status of the patient was                            re-assessed after the procedure.                           After obtaining  informed consent, the endoscope was                            passed under direct vision. Throughout the                            procedure, the patient's blood pressure, pulse, and                            oxygen saturations were monitored continuously. The                            [id number]-[id number] ([id number]) Olympus endoscope was introduced                            through the mouth, and advanced to the second part                            of duodenum. The upper GI endoscopy was                            accomplished without difficulty. The patient                            tolerated the procedure well. Scope In: Scope Out: Findings:      Non-severe esophagitis with no bleeding was found. Biopsies were taken       with a cold forceps for histology. Verification of patient       identification for the specimen was done. Estimated blood loss was       minimal.      expected sleeve gastrectomy anatomy      Bilious fluid was found in the  stomach.      The examined duodenum was normal. Impression:               - Non-severe esophagitis with no bleeding. Biopsied.                           - expected sleeve gastrectomy anatomy                           - Bilious gastric fluid.                           - Normal examined duodenum. Moderate Sedation:      Moderate (conscious) sedation was personally administered by an       anesthesia professional. The following parameters were monitored: oxygen       saturation, heart rate, blood pressure, and response to care. Total       physician intraservice time was 15 minutes. Recommendation:           - Discharge patient to home.                           - Resume previous diet.                           -  Continue present medications.                           - Await pathology results. Procedure Code(s):        --- Professional ---                           [phone], Esophagogastroduodenoscopy, flexible,                            transoral; with biopsy, single or multiple Diagnosis Code(s):        --- Professional ---                           K20.90, Esophagitis, unspecified without bleeding                           Z01.818, Encounter for other preprocedural                            examination                           E66.01, Morbid (severe) obesity due to excess                            calories CPT copyright 2022 American Medical Association. All rights reserved. The codes documented in this report are preliminary and upon coder review may  be revised to meet current compliance requirements. Sarah Dodson,  07/18/2023 12:41:54 PM This report has been signed electronically. Number of Addenda: 0

## 2023-07-18 NOTE — H&P (Signed)
Admitting Physician: Sarah Dodson  Service: Bariatric surgery  CC: Preoperative EGD  Subjective   HPI: Sarah Dodson is an 62 y.o. female who is here for preoperative EGD  Past Medical History:  Diagnosis Date   Arthritis    Asthma    Bronchitis    Constipation    Depression    Esophagitis    GERD (gastroesophageal reflux disease)    Hypertension    Joint pain    Lactose intolerance    Obesity    PONV (postoperative nausea and vomiting)    nausea with the LEFT ankle surgery   SOB (shortness of breath)     Past Surgical History:  Procedure Laterality Date   ANKLE ARTHROSCOPY Left    left 2014/ right 2016   ANKLE ARTHROSCOPY Right 01/11/2015   Procedure: RIGHT ANKLE ARTHROSCOPY;  Surgeon: Sarah Mustard, MD;  Location: [facility];  Service: Orthopedics;  Laterality: Right;   BIOPSY  08/06/2022   Procedure: BIOPSY;  Surgeon: Sarah Rakes, MD;  [facility]: [facility];  Service: Gastroenterology;;   BIOPSY BREAST Right    COLONOSCOPY WITH PROPOFOL N/A 08/06/2022   Procedure: COLONOSCOPY WITH PROPOFOL;  Surgeon: Sarah Rakes, MD;  [facility];  Service: Gastroenterology;  Laterality: N/A;   HYSTEROSCOPY WITH D & C N/A 09/05/2016   Procedure: DILATATION AND CURETTAGE /HYSTEROSCOPY;  Surgeon: Sarah Overlie, MD;  Location: [facility];  Service: Gynecology;  Laterality: N/A;   LAPAROSCOPIC GASTRIC SLEEVE RESECTION N/A 07/24/2020   Procedure: LAPAROSCOPIC GASTRIC SLEEVE RESECTION; REPAIR OF HIATAL HERNIA;  Surgeon: Sarah Kin, MD;  [facility]: [facility];  Service: General;  Laterality: N/A;   POLYPECTOMY  08/06/2022   Procedure: POLYPECTOMY;  Surgeon: Sarah Rakes, MD;  [facility];  Service: Gastroenterology;;   TOTAL KNEE ARTHROPLASTY Right 04/16/2017   Procedure: RIGHT TOTAL KNEE ARTHROPLASTY;  Surgeon: Sarah Mustard, MD;  Location: [facility] OR;  Service: Orthopedics;  Laterality: Right;   UPPER GI ENDOSCOPY N/A 07/24/2020   Procedure:  UPPER GI ENDOSCOPY;  Surgeon: Sarah Kin, MD;  [facility]: [facility];  Service: General;  Laterality: N/A;    Family History  Problem Relation Age of Onset   Hypertension Mother    Obesity Mother    Sudden death Father     Social:  reports that she quit smoking about 14 years ago. She started smoking about 49 years ago. She has a 35 pack-year smoking history. She has never used smokeless tobacco. She reports that she does not drink alcohol and does not use drugs.  Allergies:  Allergies  Allergen Reactions   Latex Itching    Medications: Current Outpatient Medications  Medication Instructions   albuterol (PROVENTIL HFA;VENTOLIN HFA) 108 (90 BASE) MCG/ACT inhaler 2 puffs, Inhalation, Every 6 hours PRN   Calcium Carbonate-Vit D-Min (CALTRATE 600+D PLUS MINERALS) 600-800 MG-UNIT CHEW 1 each, Oral, 3 times daily   fluticasone (FLONASE) 50 MCG/ACT nasal spray 1 spray, Each Nare, Daily PRN   Fluticasone-Salmeterol (ADVAIR) 250-50 MCG/DOSE AEPB 1 puff, Inhalation, 2 times daily   hydrochlorothiazide (HYDRODIURIL) 25 mg, Oral, Every morning   loratadine (CLARITIN) 10 mg, Oral, Daily   metFORMIN (GLUCOPHAGE-XR) 500 mg, Oral, Daily with supper   Multiple Vitamins-Minerals (ADULT ONE DAILY GUMMIES PO) 2 tablets, Oral, Daily, Alive Women's 50+ (Gummy)   NUTRITIONAL SUPPLEMENTS PO Oral, Extra Strength Liquid  Collagen   pantoprazole (PROTONIX) 40 mg, Oral, Daily   Vitamin D (Ergocalciferol) (DRISDOL) 50,000 Units, Oral, Every 14 days    ROS -  all of the below systems have been reviewed with the patient and positives are indicated with bold text General: chills, fever or night sweats Eyes: blurry vision or double vision ENT: epistaxis or sore throat Allergy/Immunology: itchy/watery eyes or nasal congestion Hematologic/Lymphatic: bleeding problems, blood clots or swollen lymph nodes Endocrine: temperature intolerance or unexpected weight changes Breast: new or changing breast lumps or nipple  discharge Resp: cough, shortness of breath, or wheezing CV: chest pain or dyspnea on exertion GI: as per HPI GU: dysuria, trouble voiding, or hematuria MSK: joint pain or joint stiffness Neuro: TIA or stroke symptoms Derm: pruritus and skin lesion changes Psych: anxiety and depression  Objective   PE Blood pressure 123/69, temperature 98 F (36.7 C), temperature source Temporal, resp. rate 15, height 5\' 1"  (1.549 m), weight (!) 140.2 kg, SpO2 99%. Constitutional: NAD; conversant; no deformities Eyes: Moist conjunctiva; no lid lag; anicteric; PERRL Neck: Trachea midline; no thyromegaly Lungs: Normal respiratory effort; no tactile fremitus CV: RRR; no palpable thrills; no pitting edema GI: Abd soft, nontender; no palpable hepatosplenomegaly MSK: Normal range of motion of extremities; no clubbing/cyanosis Psychiatric: Appropriate affect; alert and oriented x3 Lymphatic: No palpable cervical or axillary lymphadenopathy  Results for orders placed or performed during the hospital encounter of 07/18/23 (from the past 24 hour(s))  Glucose, capillary     Status: None   Collection Time: 07/18/23 10:45 AM  Result Value Ref Range   Glucose-Capillary 85 70 - 99 mg/dL    Imaging Orders  No imaging studies ordered today     Assessment and Plan   Sarah Dodson is a 62 year old female who is seen today for long term follow up after sleeve gastrectomy with hiatal hernia repair with Dr. Ezzard Dodson on 07/24/2020.  She feels her weight loss has plateaued. We discussed the spectrum of treatment for obesity including diet and exercise, diet and exercise with the assistance of medical professionals, alternative treatments, medications, and surgery. She is frustrated, because she truly appears despite doing everything as instructed after sleeve gastrectomy but continues to have inadequate weight loss after sleeve gastrectomy and now is restarting metformin as her Hemoglobin A1C is creeping back up. She  presents to discuss surgical options to revise her sleeve.  I explained the surgical options of converting her sleeve gastrectomy to a gastric bypass or duodenal switch. Since she does not have significant acid reflux symptoms and she is compliant with the diet and supplement regimen, I feel her best option would be conversion to a duodenal switch. I explained we have no experience with duodenal switch surgery here in [place], but are interested in building experience as I feel there are many patients in her same situation that would benefit from a stronger operation. She said she would like to proceed with robotic conversion of sleeve gastrectomy to duodenal switch with cholecystectomy here in [place].  Today she presents for preoperative EGD.  We discussed the procedure itself as well as its risk, benefits, and alternatives.  After full discussion all questions answered the patient granted consent to proceed.   Quentin Ore, MD  [facility] Use [email or web address] to contact on call provider

## 2023-07-18 NOTE — Anesthesia Preprocedure Evaluation (Addendum)
Anesthesia Evaluation  Patient identified by MRN, date of birth, ID band Patient awake    Reviewed: Allergy & Precautions, NPO status , Patient's Chart, lab work & pertinent test results  History of Anesthesia Complications (+) PONV and history of anesthetic complications  Airway Mallampati: II  TM Distance: >3 FB Neck ROM: Full    Dental  (+) Teeth Intact, Dental Advisory Given   Pulmonary asthma , former smoker   breath sounds clear to auscultation       Cardiovascular hypertension, Pt. on medications + Orthopnea   Rhythm:Regular Rate:Normal     Neuro/Psych  PSYCHIATRIC DISORDERS  Depression    negative neurological ROS     GI/Hepatic Neg liver ROS,GERD  ,,  Endo/Other  negative endocrine ROS    Renal/GU negative Renal ROS     Musculoskeletal  (+) Arthritis ,    Abdominal  (+) + obese  Peds  Hematology negative hematology ROS (+)   Anesthesia Other Findings   Reproductive/Obstetrics                              Anesthesia Physical Anesthesia Plan  ASA: 3  Anesthesia Plan: MAC   Post-op Pain Management:    Induction: Intravenous  PONV Risk Score and Plan: 0 and Propofol infusion  Airway Management Planned: Simple Face Mask and Natural Airway  Additional Equipment: None  Intra-op Plan:   Post-operative Plan:   Informed Consent: I have reviewed the patients History and Physical, chart, labs and discussed the procedure including the risks, benefits and alternatives for the proposed anesthesia with the patient or authorized representative who has indicated his/her understanding and acceptance.       Plan Discussed with: CRNA  Anesthesia Plan Comments:          Anesthesia Quick Evaluation

## 2023-07-18 NOTE — Transfer of Care (Signed)
Immediate Anesthesia Transfer of Care Note  Patient: Chantay Whitelock  Procedure(s) Performed: ESOPHAGOGASTRODUODENOSCOPY (EGD)  Patient Location: PACU  Anesthesia Type:MAC  Level of Consciousness: awake, alert , oriented, and patient cooperative  Airway & Oxygen Therapy: Patient Spontanous Breathing  Post-op Assessment: Report given to RN and Post -op Vital signs reviewed and stable  Post vital signs: Reviewed and stable  Last Vitals:  Vitals Value Taken Time  BP 139/66 07/18/23 1242  Temp    Pulse 90 07/18/23 1244  Resp 14 07/18/23 1244  SpO2 98 % on RA 07/18/23 1244  Vitals shown include unfiled device data.  Last Pain:  Vitals:   07/18/23 1032  TempSrc: Temporal  PainSc: 0-No pain         Complications: No notable events documented.

## 2023-07-21 LAB — SURGICAL PATHOLOGY

## 2023-07-22 ENCOUNTER — Encounter (HOSPITAL_COMMUNITY): Payer: Self-pay | Admitting: Surgery

## 2023-07-23 ENCOUNTER — Ambulatory Visit: Payer: BC Managed Care – PPO | Admitting: Neurology

## 2023-07-23 DIAGNOSIS — M1711 Unilateral primary osteoarthritis, right knee: Secondary | ICD-10-CM

## 2023-07-23 DIAGNOSIS — Z9884 Bariatric surgery status: Secondary | ICD-10-CM

## 2023-07-23 DIAGNOSIS — R0601 Orthopnea: Secondary | ICD-10-CM | POA: Diagnosis not present

## 2023-07-23 DIAGNOSIS — E662 Morbid (severe) obesity with alveolar hypoventilation: Secondary | ICD-10-CM

## 2023-07-31 NOTE — Progress Notes (Signed)
[facility] [name] 62 year old female 1961-04-08   HOME SLEEP TEST REPORT ( by MAIL out Watch PAT)   STUDY DATE:  07-30-2023    REFERRING CLINICIAN:  Dr Dossie Der, for bariatric surgery.  Stechschulte, Hyman Hopes, Md [address] [place],  [place] [address]    CLINICAL INFORMATION/HISTORY: patient with high risk factors for OSA/ OHV by BMI . Sarah Dodson is a 62 y.o. female patient who is seen upon referral on 07/07/2023 from Dr. Dossie Der ,MD  for a pre bariatric surgery sleep test.  Chief concern according to patient :  " I hope to undergo bariatric  surgery- I had a sleeve surgery 3 years ago, lost 70 lbs and gained back 40 lbs, and I want to undergo switch surgery now. " "I couldn't get a bypass surgery because I relied on Diclofenac for ankle pain-" .     Epworth sleepiness score: 3/ 24 points   FSS endorsed at 23/ 63 points.  GDS 3/ 15 points    BMI: 58.5 kg/m   Neck Circumference: 17.25'   FINDINGS:   Sleep Summary:   Total Recording Time (hours, min):    7 hours 38 minutes   Total Sleep Time (hours, min):    6 hours 77 minutes             Percent REM (%):   29%                                     Respiratory Indices:   Calculated pAHI (per hour):   5.5/h                          REM pAHI:     12.7/h                                            NREM pAHI: 2.6/h                            Supine AHI: The patient slept exclusively in supine with an AHI of 5.5/h . Snoring shows a mean volume of 40 dB which is at threshold and was only present for less than 4% of total sleep time.                                                Oxygen Saturation Statistics:   O2 Saturation Range (%):    Between the nadir at 88% a maximum saturation of 98% with a mean saturation of 92%                                   O2 Saturation (minutes) <89%: 0.1-minute         Pulse Rate Statistics:   Pulse Mean  (bpm):     73 bpm            Pulse Range:   Between 55 and 87  bpm              IMPRESSION:  This HST confirms the presence of very mild sleep apnea not associated with hypoxia, not associated with bradycardia.  Treatment by CPAP would be optional for a patient with this baseline study.   RECOMMENDATION: no intervention needed.     INTERPRETING PHYSICIAN:   Melvyn Novas, MD

## 2023-08-05 ENCOUNTER — Ambulatory Visit: Payer: BC Managed Care – PPO | Admitting: Family

## 2023-08-05 ENCOUNTER — Other Ambulatory Visit (INDEPENDENT_AMBULATORY_CARE_PROVIDER_SITE_OTHER): Payer: BC Managed Care – PPO

## 2023-08-05 DIAGNOSIS — M25511 Pain in right shoulder: Secondary | ICD-10-CM

## 2023-08-05 DIAGNOSIS — G8929 Other chronic pain: Secondary | ICD-10-CM

## 2023-08-05 DIAGNOSIS — M7541 Impingement syndrome of right shoulder: Secondary | ICD-10-CM

## 2023-08-06 ENCOUNTER — Telehealth: Payer: Self-pay | Admitting: Anesthesiology

## 2023-08-06 NOTE — Telephone Encounter (Signed)
-----   Message from [place] Dohmeier sent at 08/06/2023  8:29 AM EDT ----- This mild degree of apnea would be treatment optional. CD

## 2023-08-06 NOTE — Telephone Encounter (Signed)
Pt was informed of HST results. Per Dr Vickey Huger, study showed a mild degree of apnea and treatment would be optional. Pt stated she would opt out on treatment. Pt requested a copy of HST results to be sent to Dr Dossie Der. Copy of results were sent. Pt verbalized understanding. Pt had no additional questions at this time but was encouraged to call back if questions arise.

## 2023-08-06 NOTE — Procedures (Signed)
[facility] [name] 62 year old female 1961-04-08   HOME SLEEP TEST REPORT ( by MAIL out Watch PAT)   STUDY DATE:  07-30-2023    REFERRING CLINICIAN:  Dr Dossie Der, for bariatric surgery.  Stechschulte, Hyman Hopes, Md [address] [place],  [place] [address]    CLINICAL INFORMATION/HISTORY: patient with high risk factors for OSA/ OHV by BMI . Sarah Dodson is a 62 y.o. female patient who is seen upon referral on 07/07/2023 from Dr. Dossie Der ,MD  for a pre bariatric surgery sleep test.  Chief concern according to patient :  " I hope to undergo bariatric  surgery- I had a sleeve surgery 3 years ago, lost 70 lbs and gained back 40 lbs, and I want to undergo switch surgery now. " "I couldn't get a bypass surgery because I relied on Diclofenac for ankle pain-" .     Epworth sleepiness score: 3/ 24 points   FSS endorsed at 23/ 63 points.  GDS 3/ 15 points    BMI: 58.5 kg/m   Neck Circumference: 17.25'   FINDINGS:   Sleep Summary:   Total Recording Time (hours, min):    7 hours 38 minutes   Total Sleep Time (hours, min):    6 hours 77 minutes             Percent REM (%):   29%                                     Respiratory Indices:   Calculated pAHI (per hour):   5.5/h                          REM pAHI:     12.7/h                                            NREM pAHI: 2.6/h                            Supine AHI: The patient slept exclusively in supine with an AHI of 5.5/h . Snoring shows a mean volume of 40 dB which is at threshold and was only present for less than 4% of total sleep time.                                                Oxygen Saturation Statistics:   O2 Saturation Range (%):    Between the nadir at 88% a maximum saturation of 98% with a mean saturation of 92%                                   O2 Saturation (minutes) <89%: 0.1-minute         Pulse Rate Statistics:   Pulse Mean  (bpm):     73 bpm            Pulse Range:   Between 55 and 87  bpm              IMPRESSION:  This HST confirms the presence of very mild sleep apnea not associated with hypoxia, not associated with bradycardia.  Treatment by CPAP would be optional for a patient with this baseline study.   RECOMMENDATION: no intervention needed.     INTERPRETING PHYSICIAN:   Melvyn Novas, MD

## 2023-08-08 DIAGNOSIS — R7309 Other abnormal glucose: Secondary | ICD-10-CM | POA: Diagnosis not present

## 2023-08-11 ENCOUNTER — Encounter: Payer: Self-pay | Admitting: Dietician

## 2023-08-11 ENCOUNTER — Encounter: Payer: BC Managed Care – PPO | Attending: Surgery | Admitting: Dietician

## 2023-08-11 VITALS — Ht 61.0 in | Wt 319.2 lb

## 2023-08-11 DIAGNOSIS — R7303 Prediabetes: Secondary | ICD-10-CM | POA: Insufficient documentation

## 2023-08-11 DIAGNOSIS — Z6841 Body Mass Index (BMI) 40.0 and over, adult: Secondary | ICD-10-CM | POA: Insufficient documentation

## 2023-08-11 DIAGNOSIS — Z713 Dietary counseling and surveillance: Secondary | ICD-10-CM | POA: Diagnosis not present

## 2023-08-11 DIAGNOSIS — E669 Obesity, unspecified: Secondary | ICD-10-CM

## 2023-08-11 NOTE — Progress Notes (Signed)
Pre-Operative Nutrition Class:    Patient was seen on 08/11/2023 for Pre-Operative Bariatric Surgery Education at the [facility].    Surgery date: 09/16/2023 Surgery type: Conversion from Sleeve to DS  Anthropometrics  Start weight at [facility]: 311.1 lbs (date: 06/26/2023)  Height: 61 in Weight today: 319.2 lbs BMI: 60.31 kg/m2     Clinical   Medical hx: sleeve gastrectomy, HTN, asthma, hypercholesterolemia, obesity Medications: hydroclorizide, pantimazole, advair, clutin, vit D, iron supplement, multivitamin, calcium, metformin Labs: A1c 5.8, ferritin, 193 Notable signs/symptoms: none noted Any previous deficiencies? No  Samples given per MNT protocol. Patient educated on appropriate usage: [employer] Multivitamin Lot # [id number] Exp: 03/2025   Celebrate Vitamins Calcium  Lot # [id number] Exp: 10/2024   Ensure Max Protein Shake Lot # [id number] Exp: 07/07/2024  The following the learning objectives were met by the patient during this course: Identify Pre-Op Dietary Goals and will begin 2 weeks pre-operatively Identify appropriate sources of fluids and proteins  State protein recommendations and appropriate sources pre and post-operatively Identify Post-Operative Dietary Goals and will follow for 2 weeks post-operatively Identify appropriate multivitamin and calcium sources Describe the need for physical activity post-operatively and will follow MD recommendations State when to call healthcare provider regarding medication questions or post-operative complications When having a diagnosis of diabetes understanding hypoglycemia symptoms and the inclusion of 1 complex carbohydrate per meal  Handouts given during class include: Pre-Op Bariatric Surgery Diet Handout Protein Shake Handout Post-Op Bariatric Surgery Nutrition Handout BELT Program Information Flyer Support Group Information Flyer [facility] Bariatric Supplements Price  List  Follow-Up Plan: Patient will follow-up at [facility] 2 weeks post operatively for diet advancement per MD.

## 2023-08-15 DIAGNOSIS — Z903 Acquired absence of stomach [part of]: Secondary | ICD-10-CM | POA: Diagnosis not present

## 2023-08-15 DIAGNOSIS — R7303 Prediabetes: Secondary | ICD-10-CM | POA: Diagnosis not present

## 2023-08-22 ENCOUNTER — Encounter: Payer: Self-pay | Admitting: Family

## 2023-08-22 NOTE — Progress Notes (Unsigned)
Office Visit Note   Patient: Sarah Dodson           Date of Birth: 07-31-1961           MRN: [id number] Visit Date: 08/05/2023              Requested by: Thana Ates, MD [address] [place],  [place] [address] PCP: Thana Ates, MD  Chief Complaint  Patient presents with   Right Shoulder - Pain      HPI: The patient is seen today for evaluation of a several month history of right shoulderPain pain with above head and behind back reaching.  She cannot recall a specific injury associated with this.  Assessment & Plan: Visit Diagnoses:  1. Chronic right shoulder pain     Plan: Offered Depo-Medrol injection.  Patient declined today.  She would like to proceed with over-the-counter oral anti-inflammatories and home exercise program if she would like to pursue an injection she will let us know.  Follow-Up Instructions: No follow-ups on file.   Right Shoulder Exam   Tenderness  The patient is experiencing no tenderness.  Range of Motion  The patient has normal right shoulder ROM.  Tests  Impingement: positive  Other  Erythema: absent Pulse: present      Patient is alert, oriented, no adenopathy, well-dressed, normal affect, normal respiratory effort.   Imaging: No results found. No images are attached to the encounter.  Labs: Lab Results  Component Value Date   HGBA1C 5.8 (H) 05/08/2023   HGBA1C 5.8 (H) 01/08/2023     Lab Results  Component Value Date   ALBUMIN 4.1 05/08/2023   ALBUMIN 4.2 01/08/2023   ALBUMIN 4.1 07/18/2020   PREALBUMIN 23 01/08/2023    No results found for: "MG" Lab Results  Component Value Date   VD25OH 73.1 05/08/2023   VD25OH 36.3 01/08/2023    Lab Results  Component Value Date   PREALBUMIN 23 01/08/2023      Latest Ref Rng & Units 01/08/2023   10:38 AM 07/25/2020    4:47 AM 07/24/2020    4:12 PM  CBC EXTENDED  WBC 3.4 - 10.8 x10E3/uL 7.2  11.0    RBC 3.77 - 5.28 x10E6/uL 4.97  4.41     Hemoglobin 11.1 - 15.9 g/dL 62.1  30.8  65.7   HCT 34.0 - 46.6 % 43.3  38.0  43.4   Platelets 150 - 450 x10E3/uL 356  380    NEUT# 1.4 - 7.0 x10E3/uL 4.5  9.0    Lymph# 0.7 - 3.1 x10E3/uL 2.1  1.2       There is no height or weight on file to calculate BMI.  Orders:  Orders Placed This Encounter  Procedures   XR Shoulder Right   No orders of the defined types were placed in this encounter.    Procedures: No procedures performed  Clinical Data: No additional findings.  ROS:  All other systems negative, except as noted in the HPI. Review of Systems  Objective: Vital Signs: There were no vitals taken for this visit.  Specialty Comments:  No specialty comments available.  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 [facility]) 07/07/2023   Insulin resistance 04/08/2023   Prediabetes 02/12/2023   Hyperlipidemia 01/22/2023   Vitamin D deficiency 01/22/2023   Primary  hypertension 11/27/2022   Central adiposity 11/27/2022   S/P laparoscopic sleeve gastrectomy 11/27/2022   Colon cancer screening 08/06/2022   Morbid obesity with starting BMI 59 07/24/2020   Spondylolisthesis, lumbar region 12/22/2017   Total knee replacement status, right 04/16/2017   Unilateral primary osteoarthritis, right knee 09/26/2016   Past Medical History:  Diagnosis Date   Arthritis    Asthma    Bronchitis    Constipation    Depression    Esophagitis    GERD (gastroesophageal reflux disease)    Hypertension    Joint pain    Lactose intolerance    Obesity    PONV (postoperative nausea and vomiting)    nausea with the LEFT ankle surgery   SOB (shortness of breath)     Family History  Problem Relation Age of Onset   Hypertension Mother    Obesity Mother    Sudden death Father     Past Surgical History:  Procedure Laterality  Date   ANKLE ARTHROSCOPY Left    left 2014/ right 2016   ANKLE ARTHROSCOPY Right 01/11/2015   Procedure: RIGHT ANKLE ARTHROSCOPY;  Surgeon: Nadara Mustard, MD;  Location: [facility];  Service: Orthopedics;  Laterality: Right;   BIOPSY  08/06/2022   Procedure: BIOPSY;  Surgeon: Charlott Rakes, MD;  [facility];  Service: Gastroenterology;;   BIOPSY  07/18/2023   Procedure: BIOPSY;  Surgeon: Quentin Ore, MD;  [facility]: [facility];  Service: General;;   BIOPSY BREAST Right    COLONOSCOPY WITH PROPOFOL N/A 08/06/2022   Procedure: COLONOSCOPY WITH PROPOFOL;  Surgeon: Charlott Rakes, MD;  [facility];  Service: Gastroenterology;  Laterality: N/A;   ESOPHAGOGASTRODUODENOSCOPY N/A 07/18/2023   Procedure: ESOPHAGOGASTRODUODENOSCOPY (EGD);  Surgeon: Quentin Ore, MD;  Location: [facility];  Service: General;  Laterality: N/A;   HYSTEROSCOPY WITH D & C N/A 09/05/2016   Procedure: DILATATION AND CURETTAGE /HYSTEROSCOPY;  Surgeon: Richarda Overlie, MD;  Location: [facility];  Service: Gynecology;  Laterality: N/A;   LAPAROSCOPIC GASTRIC SLEEVE RESECTION N/A 07/24/2020   Procedure: LAPAROSCOPIC GASTRIC SLEEVE RESECTION; REPAIR OF HIATAL HERNIA;  Surgeon: Ovidio Kin, MD;  Location: [facility];  Service: General;  Laterality: N/A;   POLYPECTOMY  08/06/2022   Procedure: POLYPECTOMY;  Surgeon: Charlott Rakes, MD;  [facility];  Service: Gastroenterology;;   TOTAL KNEE ARTHROPLASTY Right 04/16/2017   Procedure: RIGHT TOTAL KNEE ARTHROPLASTY;  Surgeon: Nadara Mustard, MD;  Location: [facility];  Service: Orthopedics;  Laterality: Right;   UPPER GI ENDOSCOPY N/A 07/24/2020   Procedure: UPPER GI ENDOSCOPY;  Surgeon: Ovidio Kin, MD;  Location: [facility];  Service: General;  Laterality: N/A;   Social History   Occupational History   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

## 2023-09-07 DIAGNOSIS — R7309 Other abnormal glucose: Secondary | ICD-10-CM | POA: Diagnosis not present

## 2023-09-08 NOTE — Progress Notes (Addendum)
Surgery orders requested with Toniann Fail at Dr. Danise Edge office.

## 2023-09-08 NOTE — Patient Instructions (Signed)
SURGICAL WAITING ROOM VISITATION Patients having surgery or a procedure may have no more than 2 support people in the waiting area - these visitors may rotate.    Children under the age of 28 must have an adult with them who is not the patient.  If the patient needs to stay at the hospital during part of their recovery, the visitor guidelines for inpatient rooms apply. Pre-op nurse will coordinate an appropriate time for 1 support person to accompany patient in pre-op.  This support person may not rotate.    Please refer to the [facility] website for the visitor guidelines for Inpatients (after your surgery is over and you are in a regular room).       Your procedure is scheduled on: 09-16-23   Report to [facility] Main Entrance    Report to admitting at 9:15 AM   Call this number if you have problems the morning of surgery [phone]   Do not eat food :After Midnight.   After Midnight you may have the following liquids until 8:30 AM DAY OF SURGERY  Water Non-Citrus Juices (without pulp, NO RED-Apple, White grape, White cranberry) Black Coffee (NO MILK/CREAM OR CREAMERS, sugar ok)  Clear Tea (NO MILK/CREAM OR CREAMERS, sugar ok) regular and decaf                             Plain Jell-O (NO RED)                                           Fruit ices (not with fruit pulp, NO RED)                                     Popsicles (NO RED)                                                               Sports drinks like Gatorade (NO RED)                   The day of surgery:  Drink ONE (1) Pre-Surgery G2 by 8:30 AM the morning of surgery. Drink in one sitting. Do not sip.  This drink was given to you during your hospital  pre-op appointment visit. Nothing else to drink after completing the Pre-Surgery G2.          If you have questions, please contact your surgeon's office.   FOLLOW BOWEL PREP AND ANY ADDITIONAL PRE OP INSTRUCTIONS YOU RECEIVED FROM YOUR SURGEON'S  OFFICE!!!     Oral Hygiene is also important to reduce your risk of infection.                                    Remember - BRUSH YOUR TEETH THE MORNING OF SURGERY WITH YOUR REGULAR TOOTHPASTE   Do NOT smoke after Midnight   Take these medicines the morning of surgery with A SIP OF WATER:   Claritin  Pantoprazole  Okay  to use inhalers and nasal spray  Stop all vitamins and herbal supplements 7 days before surgery  DO NOT TAKE ANY ORAL DIABETIC MEDICATIONS DAY OF YOUR SURGERY  Bring CPAP mask and tubing day of surgery.                              You may not have any metal on your body including hair pins, jewelry, and body piercing             Do not wear make-up, lotions, powders, perfumes or deodorant  Do not wear nail polish including gel and S&S, artificial/acrylic nails, or any other type of covering on natural nails including finger and toenails. If you have artificial nails, gel coating, etc. that needs to be removed by a nail salon please have this removed prior to surgery or surgery may need to be canceled/ delayed if the surgeon/ anesthesia feels like they are unable to be safely monitored.   Do not shave  48 hours prior to surgery.    Do not bring valuables to the hospital. [facility] IS NOT RESPONSIBLE   FOR VALUABLES.   Contacts, dentures or bridgework may not be worn into surgery.   Bring small overnight bag day of surgery.   DO NOT BRING YOUR HOME MEDICATIONS TO THE HOSPITAL. PHARMACY WILL DISPENSE MEDICATIONS LISTED ON YOUR MEDICATION LIST TO YOU DURING YOUR ADMISSION IN THE HOSPITAL!     Special Instructions: Bring a copy of your healthcare power of attorney and living will documents the day of surgery if you haven't scanned them before.              Please read over the following fact sheets you were given: IF YOU HAVE QUESTIONS ABOUT YOUR PRE-OP INSTRUCTIONS PLEASE CALL [phone] Doree Fudge   If you received a COVID test during your pre-op visit  it is  requested that you wear a mask when out in public, stay away from anyone that may not be feeling well and notify your surgeon if you develop symptoms. If you test positive for Covid or have been in contact with anyone that has tested positive in the last 10 days please notify you surgeon.   - Preparing for Surgery Before surgery, you can play an important role.  Because skin is not sterile, your skin needs to be as free of germs as possible.  You can reduce the number of germs on your skin by washing with CHG (chlorahexidine gluconate) soap before surgery.  CHG is an antiseptic cleaner which kills germs and bonds with the skin to continue killing germs even after washing. Please DO NOT use if you have an allergy to CHG or antibacterial soaps.  If your skin becomes reddened/irritated stop using the CHG and inform your nurse when you arrive at Short Stay. Do not shave (including legs and underarms) for at least 48 hours prior to the first CHG shower.  You may shave your face/neck.  Please follow these instructions carefully:  1.  Shower with CHG Soap the night before surgery and the  morning of surgery.  2.  If you choose to wash your hair, wash your hair first as usual with your normal  shampoo.  3.  After you shampoo, rinse your hair and body thoroughly to remove the shampoo.  4.  Use CHG as you would any other liquid soap.  You can apply chg directly to the skin and wash.  Gently with a scrungie or clean washcloth.  5.  Apply the CHG Soap to your body ONLY FROM THE NECK DOWN.   Do   not use on face/ open                           Wound or open sores. Avoid contact with eyes, ears mouth and   genitals (private parts).                       Wash face,  Genitals (private parts) with your normal soap.             6.  Wash thoroughly, paying special attention to the area where your    surgery  will be performed.  7.  Thoroughly rinse your body with warm water from  the neck down.  8.  DO NOT shower/wash with your normal soap after using and rinsing off the CHG Soap.                9.  Pat yourself dry with a clean towel.            10.  Wear clean pajamas.            11.  Place clean sheets on your bed the night of your first shower and do not  sleep with pets. Day of Surgery : Do not apply any lotions/deodorants the morning of surgery.  Please wear clean clothes to the hospital/surgery center.  FAILURE TO FOLLOW THESE INSTRUCTIONS MAY RESULT IN THE CANCELLATION OF YOUR SURGERY  PATIENT SIGNATURE_________________________________  NURSE SIGNATURE__________________________________  ________________________________________________________________________   WHAT IS A BLOOD TRANSFUSION? Blood Transfusion Information  A transfusion is the replacement of blood or some of its parts. Blood is made up of multiple cells which provide different functions. Red blood cells carry oxygen and are used for blood loss replacement. White blood cells fight against infection. Platelets control bleeding. Plasma helps clot blood. Other blood products are available for specialized needs, such as hemophilia or other clotting disorders. BEFORE THE TRANSFUSION  Who gives blood for transfusions?  Healthy volunteers who are fully evaluated to make sure their blood is safe. This is blood bank blood. Transfusion therapy is the safest it has ever been in the practice of medicine. Before blood is taken from a donor, a complete history is taken to make sure that person has no history of diseases nor engages in risky social behavior (examples are intravenous drug use or sexual activity with multiple partners). The donor's travel history is screened to minimize risk of transmitting infections, such as malaria. The donated blood is tested for signs of infectious diseases, such as HIV and hepatitis. The blood is then tested to be sure it is compatible with you in order to minimize the  chance of a transfusion reaction. If you or a relative donates blood, this is often done in anticipation of surgery and is not appropriate for emergency situations. It takes many days to process the donated blood. RISKS AND COMPLICATIONS Although transfusion therapy is very safe and saves many lives, the main dangers of transfusion include:  Getting an infectious disease. Developing a transfusion reaction. This is an allergic reaction to something in the blood you were given. Every precaution is taken to prevent this. The decision  to have a blood transfusion has been considered carefully by your caregiver before blood is given. Blood is not given unless the benefits outweigh the risks. AFTER THE TRANSFUSION Right after receiving a blood transfusion, you will usually feel much better and more energetic. This is especially true if your red blood cells have gotten low (anemic). The transfusion raises the level of the red blood cells which carry oxygen, and this usually causes an energy increase. The nurse administering the transfusion will monitor you carefully for complications. HOME CARE INSTRUCTIONS  No special instructions are needed after a transfusion. You may find your energy is better. Speak with your caregiver about any limitations on activity for underlying diseases you may have. SEEK MEDICAL CARE IF:  Your condition is not improving after your transfusion. You develop redness or irritation at the intravenous (IV) site. SEEK IMMEDIATE MEDICAL CARE IF:  Any of the following symptoms occur over the next 12 hours: Shaking chills. You have a temperature by mouth above 102 F (38.9 C), not controlled by medicine. Chest, back, or muscle pain. People around you feel you are not acting correctly or are confused. Shortness of breath or difficulty breathing. Dizziness and fainting. You get a rash or develop hives. You have a decrease in urine output. Your urine turns a dark color or changes to  pink, red, or brown. Any of the following symptoms occur over the next 10 days: You have a temperature by mouth above 102 F (38.9 C), not controlled by medicine. Shortness of breath. Weakness after normal activity. The white part of the eye turns yellow (jaundice). You have a decrease in the amount of urine or are urinating less often. Your urine turns a dark color or changes to pink, red, or brown. Document Released: 09/20/2000 Document Revised: 12/16/2011 Document Reviewed: 05/09/2008 [facility] Patient Information 2014 [place], [place].  _______________________________________________________________________

## 2023-09-09 ENCOUNTER — Encounter (HOSPITAL_COMMUNITY)
Admission: RE | Admit: 2023-09-09 | Discharge: 2023-09-09 | Disposition: A | Discharge status: Home / Self Care | Payer: BC Managed Care – PPO | Source: Ambulatory Visit | Attending: Surgery | Admitting: Surgery

## 2023-09-09 ENCOUNTER — Ambulatory Visit: Payer: Self-pay | Admitting: Surgery

## 2023-09-09 ENCOUNTER — Other Ambulatory Visit: Payer: Self-pay

## 2023-09-09 ENCOUNTER — Ambulatory Visit: Payer: BC Managed Care – PPO | Admitting: Neurology

## 2023-09-09 ENCOUNTER — Encounter (HOSPITAL_COMMUNITY): Payer: Self-pay

## 2023-09-09 VITALS — BP 141/72 | HR 68 | Temp 97.7°F | Ht 61.0 in | Wt 312.0 lb

## 2023-09-09 DIAGNOSIS — Z01818 Encounter for other preprocedural examination: Secondary | ICD-10-CM | POA: Insufficient documentation

## 2023-09-09 DIAGNOSIS — I10 Essential (primary) hypertension: Secondary | ICD-10-CM

## 2023-09-09 HISTORY — DX: Prediabetes: R73.03

## 2023-09-09 LAB — CBC WITH DIFFERENTIAL/PLATELET
Abs Immature Granulocytes: 0.05 10*3/uL (ref 0.00–0.07)
Basophils Absolute: 0 10*3/uL (ref 0.0–0.1)
Basophils Relative: 0 %
Eosinophils Absolute: 0.1 10*3/uL (ref 0.0–0.5)
Eosinophils Relative: 1 %
HCT: 44.3 % (ref 36.0–46.0)
Hemoglobin: 14 g/dL (ref 12.0–15.0)
Immature Granulocytes: 0 %
Lymphocytes Relative: 20 %
Lymphs Abs: 2.3 10*3/uL (ref 0.7–4.0)
MCH: 28.2 pg (ref 26.0–34.0)
MCHC: 31.6 g/dL (ref 30.0–36.0)
MCV: 89.1 fL (ref 80.0–100.0)
Monocytes Absolute: 0.8 10*3/uL (ref 0.1–1.0)
Monocytes Relative: 7 %
Neutro Abs: 8.3 10*3/uL — ABNORMAL HIGH (ref 1.7–7.7)
Neutrophils Relative %: 72 %
Platelets: 394 10*3/uL (ref 150–400)
RBC: 4.97 MIL/uL (ref 3.87–5.11)
RDW: 14.1 % (ref 11.5–15.5)
WBC: 11.6 10*3/uL — ABNORMAL HIGH (ref 4.0–10.5)
nRBC: 0 % (ref 0.0–0.2)

## 2023-09-09 LAB — COMPREHENSIVE METABOLIC PANEL
ALT: 23 U/L (ref 0–44)
AST: 21 U/L (ref 15–41)
Albumin: 3.9 g/dL (ref 3.5–5.0)
Alkaline Phosphatase: 67 U/L (ref 38–126)
Anion gap: 7 (ref 5–15)
BUN: 22 mg/dL (ref 8–23)
CO2: 27 mmol/L (ref 22–32)
Calcium: 9.6 mg/dL (ref 8.9–10.3)
Chloride: 103 mmol/L (ref 98–111)
Creatinine, Ser: 0.65 mg/dL (ref 0.44–1.00)
GFR, Estimated: >60 mL/min (ref >60)
Glucose, Bld: 86 mg/dL (ref 70–99)
Potassium: 4.3 mmol/L (ref 3.5–5.1)
Sodium: 137 mmol/L (ref 135–145)
Total Bilirubin: 0.8 mg/dL (ref <1.2)
Total Protein: 7.6 g/dL (ref 6.5–8.1)

## 2023-09-09 NOTE — Progress Notes (Signed)
For Anesthesia: PCP - Thana Ates, MD  Cardiologist - N/A  Bowel Prep reminder:  Chest x-ray - 06/06/23 EKG - 06/06/23 Stress Test -  ECHO -  Cardiac Cath -  Pacemaker/ICD device last checked: Pacemaker orders received: Device Rep notified:  Spinal Cord Stimulator: N/A  Sleep Study - Yes CPAP - NO  Fasting Blood Sugar - N/A Checks Blood Sugar _____ times a day Date and result of last Hgb A1c-  Last dose of GLP1 agonist- N/A GLP1 instructions:   Last dose of SGLT-2 inhibitors- N/A SGLT-2 instructions:   Blood Thinner Instructions: N/A Aspirin Instructions: Last Dose:  Activity level: Can go up a flight of stairs and activities of daily living without stopping and without chest pain and/or shortness of breath   Able to exercise without chest pain and/or shortness of breath  Anesthesia review: Hx: HTN,Pre-DIA,OSA(NO CPAP).  Patient denies shortness of breath, fever, cough and chest pain at PAT appointment   Patient verbalized understanding of instructions that were given to them at the PAT appointment. Patient was also instructed that they will need to review over the PAT instructions again at home before surgery.

## 2023-09-11 NOTE — Discharge Instructions (Signed)

## 2023-09-16 ENCOUNTER — Inpatient Hospital Stay (HOSPITAL_COMMUNITY): Payer: BC Managed Care – PPO | Admitting: Anesthesiology

## 2023-09-16 ENCOUNTER — Other Ambulatory Visit: Payer: Self-pay

## 2023-09-16 ENCOUNTER — Encounter (HOSPITAL_COMMUNITY): Payer: Self-pay | Admitting: Surgery

## 2023-09-16 ENCOUNTER — Inpatient Hospital Stay (HOSPITAL_COMMUNITY)
Admission: RE | Admit: 2023-09-16 | Discharge: 2023-09-18 | DRG: 621 | Disposition: A | Discharge status: Home / Self Care | Payer: BC Managed Care – PPO | Source: Ambulatory Visit | Attending: Surgery | Admitting: Surgery

## 2023-09-16 ENCOUNTER — Encounter (HOSPITAL_COMMUNITY)
Admission: RE | Disposition: A | Discharge status: Home / Self Care | Payer: Self-pay | Source: Ambulatory Visit | Attending: Surgery

## 2023-09-16 DIAGNOSIS — K219 Gastro-esophageal reflux disease without esophagitis: Secondary | ICD-10-CM | POA: Diagnosis not present

## 2023-09-16 DIAGNOSIS — Z6841 Body Mass Index (BMI) 40.0 and over, adult: Secondary | ICD-10-CM

## 2023-09-16 DIAGNOSIS — K429 Umbilical hernia without obstruction or gangrene: Secondary | ICD-10-CM | POA: Diagnosis present

## 2023-09-16 DIAGNOSIS — Z91040 Latex allergy status: Secondary | ICD-10-CM

## 2023-09-16 DIAGNOSIS — Z96651 Presence of right artificial knee joint: Secondary | ICD-10-CM | POA: Diagnosis present

## 2023-09-16 DIAGNOSIS — E66813 Obesity, class 3: Secondary | ICD-10-CM | POA: Diagnosis present

## 2023-09-16 DIAGNOSIS — Z87891 Personal history of nicotine dependence: Secondary | ICD-10-CM

## 2023-09-16 DIAGNOSIS — M4316 Spondylolisthesis, lumbar region: Secondary | ICD-10-CM | POA: Diagnosis present

## 2023-09-16 DIAGNOSIS — Z7951 Long term (current) use of inhaled steroids: Secondary | ICD-10-CM

## 2023-09-16 DIAGNOSIS — Z8249 Family history of ischemic heart disease and other diseases of the circulatory system: Secondary | ICD-10-CM | POA: Diagnosis not present

## 2023-09-16 DIAGNOSIS — I10 Essential (primary) hypertension: Secondary | ICD-10-CM | POA: Diagnosis not present

## 2023-09-16 DIAGNOSIS — J45909 Unspecified asthma, uncomplicated: Secondary | ICD-10-CM | POA: Diagnosis present

## 2023-09-16 DIAGNOSIS — E785 Hyperlipidemia, unspecified: Secondary | ICD-10-CM | POA: Diagnosis present

## 2023-09-16 DIAGNOSIS — Z01818 Encounter for other preprocedural examination: Principal | ICD-10-CM

## 2023-09-16 DIAGNOSIS — E559 Vitamin D deficiency, unspecified: Secondary | ICD-10-CM | POA: Diagnosis present

## 2023-09-16 DIAGNOSIS — Z7984 Long term (current) use of oral hypoglycemic drugs: Secondary | ICD-10-CM | POA: Diagnosis not present

## 2023-09-16 DIAGNOSIS — M1990 Unspecified osteoarthritis, unspecified site: Secondary | ICD-10-CM | POA: Diagnosis present

## 2023-09-16 DIAGNOSIS — E739 Lactose intolerance, unspecified: Secondary | ICD-10-CM | POA: Diagnosis not present

## 2023-09-16 DIAGNOSIS — R7303 Prediabetes: Secondary | ICD-10-CM | POA: Diagnosis present

## 2023-09-16 HISTORY — PX: UPPER GI ENDOSCOPY: SHX6162

## 2023-09-16 LAB — TYPE AND SCREEN
ABO/RH(D): O POS
Antibody Screen: NEGATIVE

## 2023-09-16 LAB — GLUCOSE, CAPILLARY
Glucose-Capillary: 102 mg/dL — ABNORMAL HIGH (ref 70–99)
Glucose-Capillary: 123 mg/dL — ABNORMAL HIGH (ref 70–99)

## 2023-09-16 LAB — CBC
HCT: 44.2 % (ref 36.0–46.0)
Hemoglobin: 14.1 g/dL (ref 12.0–15.0)
MCH: 28.3 pg (ref 26.0–34.0)
MCHC: 31.9 g/dL (ref 30.0–36.0)
MCV: 88.8 fL (ref 80.0–100.0)
Platelets: 361 10*3/uL (ref 150–400)
RBC: 4.98 MIL/uL (ref 3.87–5.11)
RDW: 13.9 % (ref 11.5–15.5)
WBC: 14.1 10*3/uL — ABNORMAL HIGH (ref 4.0–10.5)
nRBC: 0 % (ref 0.0–0.2)

## 2023-09-16 LAB — HEMOGLOBIN AND HEMATOCRIT, BLOOD
HCT: 40.9 % (ref 36.0–46.0)
Hemoglobin: 13.2 g/dL (ref 12.0–15.0)

## 2023-09-16 LAB — CREATININE, SERUM
Creatinine, Ser: 0.79 mg/dL (ref 0.44–1.00)
GFR, Estimated: >60 mL/min (ref >60)

## 2023-09-16 SURGERY — GASTRIC RESTRICTIVE DUODENAL PROCEDURE (DUODENAL SWITCH), ROBOT-ASSISTED
Anesthesia: General

## 2023-09-16 MED ORDER — ACETAMINOPHEN 500 MG PO TABS
ORAL_TABLET | ORAL | Status: AC
  Administered 2023-09-16: 1000 mg via ORAL
  Filled 2023-09-16: qty 2

## 2023-09-16 MED ORDER — LIDOCAINE HCL (CARDIAC) PF 100 MG/5ML IV SOSY
PREFILLED_SYRINGE | INTRAVENOUS | Status: DC | PRN
  Administered 2023-09-16: 50 mg via INTRAVENOUS

## 2023-09-16 MED ORDER — LACTATED RINGERS IV SOLN: INTRAVENOUS | Status: AC

## 2023-09-16 MED ORDER — ONDANSETRON HCL 4 MG/2ML IJ SOLN
INTRAMUSCULAR | Status: DC | PRN
  Administered 2023-09-16: 4 mg via INTRAVENOUS

## 2023-09-16 MED ORDER — ALBUTEROL SULFATE HFA 108 (90 BASE) MCG/ACT IN AERS: 2.0000 | INHALATION_SPRAY | Freq: Four times a day (QID) | RESPIRATORY_TRACT | Status: DC | PRN

## 2023-09-16 MED ORDER — FENTANYL CITRATE PF 50 MCG/ML IJ SOSY
PREFILLED_SYRINGE | INTRAMUSCULAR | Status: AC
  Administered 2023-09-16: 50 ug via INTRAVENOUS
  Filled 2023-09-16: qty 2

## 2023-09-16 MED ORDER — MOMETASONE FURO-FORMOTEROL FUM 200-5 MCG/ACT IN AERO
2.0000 | INHALATION_SPRAY | Freq: Two times a day (BID) | RESPIRATORY_TRACT | Status: DC
  Administered 2023-09-16 – 2023-09-18 (×4): 2 via RESPIRATORY_TRACT
  Filled 2023-09-16: qty 8.8

## 2023-09-16 MED ORDER — ROCURONIUM BROMIDE 100 MG/10ML IV SOLN
INTRAVENOUS | Status: DC | PRN
  Administered 2023-09-16: 70 mg via INTRAVENOUS
  Administered 2023-09-16: 20 mg via INTRAVENOUS
  Administered 2023-09-16: 30 mg via INTRAVENOUS

## 2023-09-16 MED ORDER — CHLORHEXIDINE GLUCONATE 0.12 % MT SOLN
15.0000 mL | Freq: Once | OROMUCOSAL | Status: AC
  Administered 2023-09-16: 15 mL via OROMUCOSAL

## 2023-09-16 MED ORDER — MIDAZOLAM HCL 2 MG/2ML IJ SOLN
INTRAMUSCULAR | Status: AC
  Filled 2023-09-16: qty 2

## 2023-09-16 MED ORDER — CHLORHEXIDINE GLUCONATE CLOTH 2 % EX PADS: 6.0000 | MEDICATED_PAD | Freq: Once | CUTANEOUS | Status: DC

## 2023-09-16 MED ORDER — HYDRALAZINE HCL 20 MG/ML IJ SOLN: 10.0000 mg | INTRAMUSCULAR | Status: DC | PRN

## 2023-09-16 MED ORDER — LIDOCAINE HCL (PF) 2 % IJ SOLN
INTRAMUSCULAR | Status: DC | PRN
  Administered 2023-09-16: 1.5 mg/kg/h via INTRADERMAL

## 2023-09-16 MED ORDER — ROCURONIUM BROMIDE 10 MG/ML (PF) SYRINGE
PREFILLED_SYRINGE | INTRAVENOUS | Status: AC
Start: 2023-09-16 — End: ?
  Filled 2023-09-16: qty 20

## 2023-09-16 MED ORDER — CEFAZOLIN SODIUM-DEXTROSE 1-4 GM/50ML-% IV SOLN
INTRAVENOUS | Status: DC | PRN
  Administered 2023-09-16: 1 g via INTRAVENOUS

## 2023-09-16 MED ORDER — LACTATED RINGERS IV SOLN: INTRAVENOUS | Status: DC | PRN

## 2023-09-16 MED ORDER — LACTATED RINGERS IR SOLN
Status: DC | PRN
  Administered 2023-09-16: 1000 mL

## 2023-09-16 MED ORDER — OXYCODONE HCL 5 MG/5ML PO SOLN
5.0000 mg | Freq: Four times a day (QID) | ORAL | Status: DC | PRN
  Administered 2023-09-17 (×2): 5 mg via ORAL
  Filled 2023-09-16 (×2): qty 5

## 2023-09-16 MED ORDER — HEPARIN SODIUM (PORCINE) 5000 UNIT/ML IJ SOLN
5000.0000 [IU] | INTRAMUSCULAR | Status: AC
  Administered 2023-09-16: 5000 [IU] via SUBCUTANEOUS
  Filled 2023-09-16: qty 1

## 2023-09-16 MED ORDER — PROPOFOL 10 MG/ML IV BOLUS
INTRAVENOUS | Status: AC
Start: 2023-09-16 — End: ?
  Filled 2023-09-16: qty 20

## 2023-09-16 MED ORDER — ACETAMINOPHEN 160 MG/5ML PO SOLN
1000.0000 mg | Freq: Three times a day (TID) | ORAL | Status: DC
  Administered 2023-09-17: 1000 mg via ORAL
  Filled 2023-09-16 (×2): qty 40.6

## 2023-09-16 MED ORDER — MIDAZOLAM HCL 5 MG/5ML IJ SOLN
INTRAMUSCULAR | Status: DC | PRN
  Administered 2023-09-16: 2 mg via INTRAVENOUS

## 2023-09-16 MED ORDER — PROPOFOL 10 MG/ML IV BOLUS
INTRAVENOUS | Status: DC | PRN
  Administered 2023-09-16: 200 mg via INTRAVENOUS

## 2023-09-16 MED ORDER — FENTANYL CITRATE PF 50 MCG/ML IJ SOSY
PREFILLED_SYRINGE | INTRAMUSCULAR | Status: AC
  Administered 2023-09-16: 50 ug via INTRAVENOUS
  Filled 2023-09-16: qty 1

## 2023-09-16 MED ORDER — OXYCODONE HCL 5 MG/5ML PO SOLN
ORAL | Status: AC
  Administered 2023-09-16: 5 mg via ORAL
  Filled 2023-09-16: qty 5

## 2023-09-16 MED ORDER — APREPITANT 40 MG PO CAPS
40.0000 mg | ORAL_CAPSULE | ORAL | Status: AC
  Administered 2023-09-16: 40 mg via ORAL
  Filled 2023-09-16: qty 1

## 2023-09-16 MED ORDER — DROPERIDOL 2.5 MG/ML IJ SOLN: 0.6250 mg | Freq: Once | INTRAMUSCULAR | Status: DC | PRN

## 2023-09-16 MED ORDER — ONDANSETRON HCL 4 MG/2ML IJ SOLN
4.0000 mg | INTRAMUSCULAR | Status: DC | PRN
Start: 2023-09-16 — End: 2023-09-18

## 2023-09-16 MED ORDER — ENSURE MAX PROTEIN PO LIQD
2.0000 [oz_av] | ORAL | Status: DC
  Administered 2023-09-17 – 2023-09-18 (×10): 2 [oz_av] via ORAL

## 2023-09-16 MED ORDER — ACETAMINOPHEN 500 MG PO TABS
1000.0000 mg | ORAL_TABLET | Freq: Three times a day (TID) | ORAL | Status: DC
  Administered 2023-09-17 – 2023-09-18 (×3): 1000 mg via ORAL
  Filled 2023-09-16 (×3): qty 2

## 2023-09-16 MED ORDER — LORATADINE 10 MG PO TABS
10.0000 mg | ORAL_TABLET | Freq: Every day | ORAL | Status: DC
  Administered 2023-09-17 – 2023-09-18 (×2): 10 mg via ORAL
  Filled 2023-09-16 (×2): qty 1

## 2023-09-16 MED ORDER — ALBUTEROL SULFATE (2.5 MG/3ML) 0.083% IN NEBU: 2.5000 mg | INHALATION_SOLUTION | Freq: Four times a day (QID) | RESPIRATORY_TRACT | Status: DC | PRN

## 2023-09-16 MED ORDER — FLUTICASONE PROPIONATE 50 MCG/ACT NA SUSP: 1.0000 | Freq: Every day | NASAL | Status: DC | PRN

## 2023-09-16 MED ORDER — PANTOPRAZOLE SODIUM 40 MG IV SOLR
40.0000 mg | Freq: Every day | INTRAVENOUS | Status: DC
  Administered 2023-09-16 – 2023-09-17 (×2): 40 mg via INTRAVENOUS
  Filled 2023-09-16 (×2): qty 10

## 2023-09-16 MED ORDER — ENOXAPARIN (LOVENOX) PATIENT EDUCATION KIT
1.0000 | PACK | Freq: Once | 0 refills | Status: AC
  Filled 2023-09-16: qty 1, 1d supply, fill #0

## 2023-09-16 MED ORDER — SIMETHICONE 80 MG PO CHEW
80.0000 mg | CHEWABLE_TABLET | Freq: Four times a day (QID) | ORAL | Status: DC | PRN
  Administered 2023-09-16 – 2023-09-17 (×2): 80 mg via ORAL
  Filled 2023-09-16 (×2): qty 1

## 2023-09-16 MED ORDER — GABAPENTIN 100 MG PO CAPS
100.0000 mg | ORAL_CAPSULE | Freq: Two times a day (BID) | ORAL | Status: DC
  Administered 2023-09-16 – 2023-09-18 (×4): 100 mg via ORAL
  Filled 2023-09-16 (×4): qty 1

## 2023-09-16 MED ORDER — FENTANYL CITRATE (PF) 100 MCG/2ML IJ SOLN
INTRAMUSCULAR | Status: AC
  Filled 2023-09-16: qty 2

## 2023-09-16 MED ORDER — ORAL CARE MOUTH RINSE: 15.0000 mL | Freq: Once | OROMUCOSAL | Status: AC

## 2023-09-16 MED ORDER — BUPIVACAINE LIPOSOME 1.3 % IJ SUSP
INTRAMUSCULAR | Status: DC | PRN
  Administered 2023-09-16: 50 mL

## 2023-09-16 MED ORDER — DEXAMETHASONE SODIUM PHOSPHATE 4 MG/ML IJ SOLN
INTRAMUSCULAR | Status: DC | PRN
  Administered 2023-09-16: 8 mg via INTRAVENOUS

## 2023-09-16 MED ORDER — BUPIVACAINE HCL (PF) 0.25 % IJ SOLN
INTRAMUSCULAR | Status: AC
  Filled 2023-09-16: qty 30

## 2023-09-16 MED ORDER — PHENYLEPHRINE HCL-NACL 20-0.9 MG/250ML-% IV SOLN
INTRAVENOUS | Status: DC | PRN
  Administered 2023-09-16: 50 ug/min via INTRAVENOUS

## 2023-09-16 MED ORDER — STERILE WATER FOR IRRIGATION IR SOLN
Status: DC | PRN
  Administered 2023-09-16: 1000 mL

## 2023-09-16 MED ORDER — APOAEQUORIN 10 MG PO CAPS: ORAL_CAPSULE | Freq: Every day | ORAL | Status: DC

## 2023-09-16 MED ORDER — FENTANYL CITRATE PF 50 MCG/ML IJ SOSY
25.0000 ug | PREFILLED_SYRINGE | INTRAMUSCULAR | Status: DC | PRN
  Administered 2023-09-16: 50 ug via INTRAVENOUS

## 2023-09-16 MED ORDER — BUPIVACAINE LIPOSOME 1.3 % IJ SUSP: 20.0000 mL | Freq: Once | INTRAMUSCULAR | Status: DC

## 2023-09-16 MED ORDER — FENTANYL CITRATE (PF) 100 MCG/2ML IJ SOLN
INTRAMUSCULAR | Status: DC | PRN
  Administered 2023-09-16: 100 ug via INTRAVENOUS

## 2023-09-16 MED ORDER — 0.9 % SODIUM CHLORIDE (POUR BTL) OPTIME
TOPICAL | Status: DC | PRN
  Administered 2023-09-16: 1000 mL

## 2023-09-16 MED ORDER — EPHEDRINE SULFATE (PRESSORS) 50 MG/ML IJ SOLN
INTRAMUSCULAR | Status: DC | PRN
Start: 2023-09-16 — End: 2023-09-16
  Administered 2023-09-16: 10 mg via INTRAVENOUS
  Administered 2023-09-16: 5 mg via INTRAVENOUS
  Administered 2023-09-16: 10 mg via INTRAVENOUS

## 2023-09-16 MED ORDER — SUGAMMADEX SODIUM 200 MG/2ML IV SOLN
INTRAVENOUS | Status: DC | PRN
  Administered 2023-09-16: 400 mg via INTRAVENOUS

## 2023-09-16 MED ORDER — ENOXAPARIN SODIUM 60 MG/0.6ML IJ SOSY
60.0000 mg | PREFILLED_SYRINGE | Freq: Two times a day (BID) | INTRAMUSCULAR | 0 refills | Status: DC
  Filled 2023-09-16: qty 16.8, 14d supply, fill #0

## 2023-09-16 MED ORDER — ACETAMINOPHEN 500 MG PO TABS
1000.0000 mg | ORAL_TABLET | ORAL | Status: AC
  Administered 2023-09-16: 1000 mg via ORAL
  Filled 2023-09-16: qty 2

## 2023-09-16 MED ORDER — METOPROLOL TARTRATE 5 MG/5ML IV SOLN: 5.0000 mg | Freq: Four times a day (QID) | INTRAVENOUS | Status: DC | PRN

## 2023-09-16 MED ORDER — INSULIN ASPART 100 UNIT/ML IJ SOLN: 0.0000 [IU] | INTRAMUSCULAR | Status: DC | PRN

## 2023-09-16 MED ORDER — HEPARIN SODIUM (PORCINE) 5000 UNIT/ML IJ SOLN
5000.0000 [IU] | Freq: Three times a day (TID) | INTRAMUSCULAR | Status: DC
  Administered 2023-09-16 – 2023-09-18 (×5): 5000 [IU] via SUBCUTANEOUS
  Filled 2023-09-16 (×5): qty 1

## 2023-09-16 MED ORDER — BUPIVACAINE LIPOSOME 1.3 % IJ SUSP
INTRAMUSCULAR | Status: AC
  Filled 2023-09-16: qty 20

## 2023-09-16 MED ORDER — MORPHINE SULFATE (PF) 2 MG/ML IV SOLN: 1.0000 mg | INTRAVENOUS | Status: DC | PRN

## 2023-09-16 MED ORDER — SODIUM CHLORIDE 0.9 % IV SOLN
2.0000 g | INTRAVENOUS | Status: AC
  Administered 2023-09-16: 2 g via INTRAVENOUS
  Filled 2023-09-16: qty 2

## 2023-09-16 SURGICAL SUPPLY — 70 items
ANTIFOG SOL W/FOAM PAD STRL (MISCELLANEOUS) ×1
APPLIER CLIP ROT 10 11.4 M/L (STAPLE)
BAG COUNTER SPONGE SURGICOUNT (BAG) ×1 IMPLANT
BLADE SURG SZ11 CARB STEEL (BLADE) ×1 IMPLANT
CHLORAPREP W/TINT 26 (MISCELLANEOUS) ×2 IMPLANT
CLIP APPLIE ROT 10 11.4 M/L (STAPLE) IMPLANT
CLIP LIGATING HEM O LOK PURPLE (MISCELLANEOUS) IMPLANT
CLIP LIGATING HEMO O LOK GREEN (MISCELLANEOUS) IMPLANT
COVER SURGICAL LIGHT HANDLE (MISCELLANEOUS) ×1 IMPLANT
COVER TIP SHEARS 8 DVNC (MISCELLANEOUS) IMPLANT
DERMABOND ADVANCED .7 DNX12 (GAUZE/BANDAGES/DRESSINGS) ×1 IMPLANT
DRAIN CHANNEL 19F RND (DRAIN) IMPLANT
DRAPE ARM DVNC X/XI (DISPOSABLE) ×4 IMPLANT
DRAPE COLUMN DVNC XI (DISPOSABLE) ×1 IMPLANT
DRIVER NDL MEGA SUTCUT DVNCXI (INSTRUMENTS) ×1 IMPLANT
DRIVER NDLE MEGA SUTCUT DVNCXI (INSTRUMENTS) ×1
ELECT REM PT RETURN 15FT ADLT (MISCELLANEOUS) ×1 IMPLANT
EVACUATOR SILICONE 100CC (DRAIN) IMPLANT
GAUZE 4X4 16PLY ~~LOC~~+RFID DBL (SPONGE) ×1 IMPLANT
GLOVE BIO SURGEON STRL SZ7.5 (GLOVE) ×2 IMPLANT
GLOVE INDICATOR 8.0 STRL GRN (GLOVE) ×2 IMPLANT
GOWN STRL REUS W/ TWL XL LVL3 (GOWN DISPOSABLE) ×2 IMPLANT
GRASPER SUT TROCAR 14GX15 (MISCELLANEOUS) ×1 IMPLANT
GRASPER TIP-UP FEN DVNC XI (INSTRUMENTS) ×1 IMPLANT
HEMOSTAT SNOW SURGICEL 2X4 (HEMOSTASIS) IMPLANT
IRRIG SUCT STRYKERFLOW 2 WTIP (MISCELLANEOUS) ×1
IRRIGATION SUCT STRKRFLW 2 WTP (MISCELLANEOUS) ×1 IMPLANT
KIT BASIN OR (CUSTOM PROCEDURE TRAY) ×1 IMPLANT
KIT TURNOVER KIT A (KITS) IMPLANT
LUBRICANT JELLY K Y 4OZ (MISCELLANEOUS) IMPLANT
MARKER SKIN DUAL TIP RULER LAB (MISCELLANEOUS) IMPLANT
MAT PREVALON FULL STRYKER (MISCELLANEOUS) ×1 IMPLANT
NDL SPNL 18GX3.5 QUINCKE PK (NEEDLE) ×1 IMPLANT
NEEDLE SPNL 18GX3.5 QUINCKE PK (NEEDLE) ×1
OBTURATOR OPTICAL STND 8 DVNC (TROCAR) ×1
OBTURATOR OPTICALSTD 8 DVNC (TROCAR) ×1 IMPLANT
PACK CARDIOVASCULAR III (CUSTOM PROCEDURE TRAY) ×1 IMPLANT
POUCH RETRIEVAL ECOSAC 10 (ENDOMECHANICALS) IMPLANT
RELOAD STAPLE 60 2.5 WHT DVNC (STAPLE) IMPLANT
RELOAD STAPLE 60 3.5 BLU DVNC (STAPLE) IMPLANT
SCISSORS LAP 5X35 DISP (ENDOMECHANICALS) IMPLANT
SCISSORS MNPLR CVD DVNC XI (INSTRUMENTS) ×1 IMPLANT
SEAL UNIV 5-12 XI (MISCELLANEOUS) ×4 IMPLANT
SET TUBE SMOKE EVAC HIGH FLOW (TUBING) ×1 IMPLANT
SLEEVE GASTRECTOMY 40FR VISIGI (MISCELLANEOUS) IMPLANT
SOL ELECTROSURG ANTI STICK (MISCELLANEOUS) ×1
SOLUTION ANTFG W/FOAM PAD STRL (MISCELLANEOUS) ×1 IMPLANT
SOLUTION ELECTROSURG ANTI STCK (MISCELLANEOUS) ×1 IMPLANT
SPIKE FLUID TRANSFER (MISCELLANEOUS) ×1 IMPLANT
STAPLER 60 SUREFORM DVNC (STAPLE) ×1 IMPLANT
STAPLER RELOAD 2.5X60 WHT DVNC (STAPLE) ×1
STAPLER RELOAD 3.5X60 BLU DVNC (STAPLE)
SUT ETHILON 2 0 PS N (SUTURE) IMPLANT
SUT MNCRL AB 4-0 PS2 18 (SUTURE) ×2 IMPLANT
SUT SILK 0 SH 30 (SUTURE) IMPLANT
SUT VIC AB 0 CT1 27XBRD ANTBC (SUTURE) ×1 IMPLANT
SUT VIC AB 2-0 SH 27XBRD (SUTURE) ×1 IMPLANT
SUT VICRYL 0 UR6 27IN ABS (SUTURE) IMPLANT
SUT VLOC 180 2-0 6IN GS21 (SUTURE) IMPLANT
SUT VLOC 180 2-0 9IN GS21 (SUTURE) IMPLANT
SUT VLOC 180 3-0 9IN GS21 (SUTURE) IMPLANT
SUT VLOC 3-0 9IN GRN (SUTURE) IMPLANT
SYR 20ML LL LF (SYRINGE) ×1 IMPLANT
SYS BAG RETRIEVAL 10MM (BASKET)
SYSTEM BAG RETRIEVAL 10MM (BASKET) IMPLANT
TOWEL OR 17X26 10 PK STRL BLUE (TOWEL DISPOSABLE) ×1 IMPLANT
TRAY FOLEY MTR SLVR 16FR STAT (SET/KITS/TRAYS/PACK) IMPLANT
TROCAR ADV FIXATION 12X100MM (TROCAR) IMPLANT
TROCAR ADV FIXATION 5X100MM (TROCAR) ×1 IMPLANT
TROCAR Z-THREAD FIOS 5X100MM (TROCAR) ×1 IMPLANT

## 2023-09-16 NOTE — Progress Notes (Signed)
PHARMACIST - PHYSICIAN ORDER COMMUNICATION  CONCERNING: P&T Medication Policy on Herbal Medications  DESCRIPTION:  This patient's order for:  Apoqequorin  has been noted.  This product(s) is classified as an "herbal" or natural product. Due to a lack of definitive safety studies or FDA approval, nonstandard manufacturing practices, plus the potential risk of unknown drug-drug interactions while on inpatient medications, the Pharmacy and Therapeutics Committee does not permit the use of "herbal" or natural products of this type within [facility].   ACTION TAKEN: The pharmacy department is unable to verify this order at this time and your patient has been informed of this safety policy. Please reevaluate patient's clinical condition at discharge and address if the herbal or natural product(s) should be resumed at that time.  Terrilee Files, PharmD

## 2023-09-16 NOTE — Op Note (Signed)
Patient: Sarah Dodson (1961-05-11, [id number])  Date of Surgery: 09/16/2023  Preoperative Diagnosis: MORBID OBESITY   Postoperative Diagnosis: MORBID OBESITY   Surgical Procedure: XI ROBOT ASSISTED SINGLE ANASTOMOSIS DUODENOILEOSTOMY AFTER PREVIOUS SLEEVE GASTRECTOMY UPPER GI ENDOSCOPY  Operative Team Members:  Surgeons and Role:    * Skyleen Bentley, Hyman Hopes, MD - Primary    * Gaynelle Adu, MD - Assisting   Anesthesiologist: Atilano Median, DO CRNA: Jamelle Rushing, CRNA; Carloyn Manner, CRNA   Anesthesia: General   Fluids:  Total I/O In: 650 [I.V.:500; IV Piggyback:150] Out: 50 [Blood:50]  Complications: None  Drains:  (19 Fr) Jackson-Pratt drain(s) with closed bulb suction in the right abdomen near the duodenal ileostomy    Specimen: None  Disposition:  PACU - hemodynamically stable.  Plan of Care: Admit to inpatient     Indications for Procedure:  Sarah Dodson is a 62 y.o. female who completed the preoperative pathway for robotic conversion of sleeve gastrectomy to single anastomosis duodenoileostomy with upper endoscopy and cholecystectomy. We discussed the procedure itself, as well as its risks, benefits and alternatives. We discussed the expected excess and total weight loss 87% and 38% at 5 years and 80% and 34% at 10 years. We discussed the technical difficulties of creating a duodenal anastomosis but the benefit of sparing the pylorus to reduce ulcer, bile reflux and dumping syndrome issues. I explained the risk of internal hernia. We discussed the importance of life long adherence to the diet and vitamin regimen to avoid vitamin deficiencies and malnutrition. We discussed the changes in bowel habits with risks of diarrhea and foul smelling flatus. We discussed the procedure, its risks, benefits and alternatives.  After a full discussion and all questions answered, the patient granted consent to proceed.   Findings: Prevous cholecystectomy,  umbilical hernia defects closed with PMI and vicryl   Description of Procedure:   On the date stated above the patient was taken operating room suite and placed in supine position.  General endotracheal anesthesia was induced.  A timeout was completed verifying the correct patient, procedure, position, and equipment needed for the case.  The patient's abdomen was prepped and draped in usual sterile fashion.  I began by making a 8 mm incision in the left upper quadrant.  A 5 mm trocar was inserted and the abdomen was inflated to 15 mmHg.  This trocar was upsized to an 8 mm trocar and 3 additional trocars were placed across the mid abdomen.  The trocar to the right of the midline was a 12 mm trocar for the stapler port.  On inspection of the abdomen there was omentum stuck to the umbilical hernia defect.  The omentum was pulled down to the abdomen and the defect was used for a 5 mm trocar site for the assistant port.  At the conclusion of the case the PMI and an 0 Vicryl suture were used to close these defects with 2 figure-of-eight sutures.  A liver retractor was placed in the epigastric area to lift the right and left lobes of the liver.  The gallbladder was surgically absent.  We position the patient in 20 degrees of head up position and the left side down.  The right upper quadrant was inspected.  I worked with the vessel sealer to dissect out the duodenum.  I mobilized the attachments both superiorly and inferior to the duodenal bulb.  I worked carefully behind the duodenum using the vessel sealer to seal the many small vessels  as I went to create a retroduodenal window.  I then passed the white load of the Sureform linear stapler across the duodenum and fired it to divide the duodenal bulb.  The endoscope was inserted down the mouth, esophagus, through the stomach and passed the pylorus and the duodenal bulb appeared appropriately sized.  I then counted the small intestine from the terminal ileum back 300  cm to the point where I would create the duodenal ileostomy.  I used 2-0 V-Loc for an outer layer and 3 oh V-Loc for an inner layer.  The posterior outer layer was sewn first connecting the duodenal staple line to the ileum.  Then enterotomies were created in the duodenum and the antimesenteric border the ileum.  The inner layer was closed using 3-0 V-Loc suture in running fashion with Connell sutures in the corners.  The outer layer was closed using running 2-0 V-Loc suture anteriorly.  Two 2-0 Vicryl stay sutures were placed on the hepatobiliary limb to orient the anastomosis and prevent bile reflux.  I then used the adult upper endoscope to inflate the stomach and the duodenum.  I had difficulty cannulating the anastomosis at first due to the orientation of the anatomy.  The area was first not insufflating past the anastomosis.  I removed the 2 Vicryl stay sutures and then was able to pass the endoscope through the anastomosis.  The anastomosis was submerged in saline and insufflation was used with the endoscope and there was no bubbling consistent with a negative leak test.  The bowel was then decompressed with the endoscope and the endoscope was removed.  The mesenteric defect between the ileal mesentery and the mesocolon was closed using running 0 silk suture.  A 19 [place] JP drain was brought through the right abdominal wall port site and placed near the end anastomosis in the infrahepatic space.  This was fixed to the skin with 0 nylon.  I then closed the 12 mm trocar site with a PMI suture passer and 0 Vicryl suture.  The 2 periumbilical hernia defects were closed using 0 Vicryl suture in a PMI suture passer.  The skin was closed using 4-0 Monocryl and Dermabond.  All sponge needle counts were correct at the end of this case.  At the end of the case we reviewed the infection status of the case. Patient: Private Patient Elective Case Case: Elective Infection Present At Time Of Surgery (PATOS): Some  contamination related to creating bowel anastomosis  Ivar Drape, MD General, Bariatric, & Minimally Invasive Surgery [facility], [place]

## 2023-09-16 NOTE — H&P (Signed)
Admitting Physician: Hyman Hopes Keyen Marban  Service: Bariatric Surgery  CC: Obesity  Subjective   HPI: Sarah Dodson is an 62 y.o. female who is here for robotic single anastomosis duodenoileostomy with cholecystectomy.  Past Medical History:  Diagnosis Date   Arthritis    Asthma    Bronchitis    Constipation    Depression    Esophagitis    GERD (gastroesophageal reflux disease)    Hypertension    Joint pain    Lactose intolerance    Obesity    PONV (postoperative nausea and vomiting)    nausea with the LEFT ankle surgery   Pre-diabetes    SOB (shortness of breath)     Past Surgical History:  Procedure Laterality Date   ANKLE ARTHROSCOPY Left    left 2014/ right 2016   ANKLE ARTHROSCOPY Right 01/11/2015   Procedure: RIGHT ANKLE ARTHROSCOPY;  Surgeon: Nadara Mustard, MD;  Location: [facility];  Service: Orthopedics;  Laterality: Right;   BIOPSY  08/06/2022   Procedure: BIOPSY;  Surgeon: Charlott Rakes, MD;  Location: [facility];  Service: Gastroenterology;;   BIOPSY  07/18/2023   Procedure: BIOPSY;  Surgeon: Quentin Ore, MD;  Location: [facility];  Service: General;;   BIOPSY BREAST Right    COLONOSCOPY WITH PROPOFOL N/A 08/06/2022   Procedure: COLONOSCOPY WITH PROPOFOL;  Surgeon: Charlott Rakes, MD;  Location: [facility];  Service: Gastroenterology;  Laterality: N/A;   ESOPHAGOGASTRODUODENOSCOPY N/A 07/18/2023   Procedure: ESOPHAGOGASTRODUODENOSCOPY (EGD);  Surgeon: Quentin Ore, MD;  Location: [facility];  Service: General;  Laterality: N/A;   HYSTEROSCOPY WITH D & C N/A 09/05/2016   Procedure: DILATATION AND CURETTAGE /HYSTEROSCOPY;  Surgeon: Richarda Overlie, MD;  Location: [facility];  Service: Gynecology;  Laterality: N/A;   LAPAROSCOPIC GASTRIC SLEEVE RESECTION N/A 07/24/2020   Procedure: LAPAROSCOPIC GASTRIC SLEEVE RESECTION; REPAIR OF HIATAL HERNIA;  Surgeon: Ovidio Kin, MD;  Location: [facility];  Service: General;  Laterality:  N/A;   POLYPECTOMY  08/06/2022   Procedure: POLYPECTOMY;  Surgeon: Charlott Rakes, MD;  Location: [facility];  Service: Gastroenterology;;   TOTAL KNEE ARTHROPLASTY Right 04/16/2017   Procedure: RIGHT TOTAL KNEE ARTHROPLASTY;  Surgeon: Nadara Mustard, MD;  Location: [facility] OR;  Service: Orthopedics;  Laterality: Right;   UPPER GI ENDOSCOPY N/A 07/24/2020   Procedure: UPPER GI ENDOSCOPY;  Surgeon: Ovidio Kin, MD;  Location: [facility];  Service: General;  Laterality: N/A;    Family History  Problem Relation Age of Onset   Hypertension Mother    Obesity Mother    Sudden death Father     Social:  reports that she quit smoking about 14 years ago. Her smoking use included cigarettes. She started smoking about 49 years ago. She has a 35 pack-year smoking history. She has never used smokeless tobacco. She reports that she does not drink alcohol and does not use drugs.  Allergies:  Allergies  Allergen Reactions   Latex Itching    Medications: Current Outpatient Medications  Medication Instructions   albuterol (PROVENTIL HFA;VENTOLIN HFA) 108 (90 BASE) MCG/ACT inhaler 2 puffs, Inhalation, Every 6 hours PRN   Apoaequorin (PREVAGEN PO) 1 capsule, Oral, Daily   Calcium Carbonate-Vit D-Min (CALTRATE 600+D PLUS MINERALS) 600-800 MG-UNIT CHEW 1 each, Oral, 3 times daily   fluticasone (FLONASE) 50 MCG/ACT nasal spray 1 spray, Each Nare, Daily PRN   Fluticasone-Salmeterol (ADVAIR) 250-50 MCG/DOSE AEPB 1 puff, Inhalation, 2 times daily   hydrochlorothiazide (HYDRODIURIL) 25 mg, Oral, Every morning  loratadine (CLARITIN) 10 mg, Oral, Daily   metFORMIN (GLUCOPHAGE-XR) 500 mg, Oral, Daily with supper   Multiple Vitamins-Minerals (ADULT ONE DAILY GUMMIES PO) 2 tablets, Oral, Daily, Alive Women's 50+ (Gummy)   NUTRITIONAL SUPPLEMENTS PO 1 Dose, Oral, Daily, Extra Strength Liquid  Collagen   pantoprazole (PROTONIX) 40 mg, Oral, Daily   Vitamin D (Ergocalciferol) (DRISDOL) 50,000 Units, Oral, Every 14  days    ROS - all of the below systems have been reviewed with the patient and positives are indicated with bold text General: chills, fever or night sweats Eyes: blurry vision or double vision ENT: epistaxis or sore throat Allergy/Immunology: itchy/watery eyes or nasal congestion Hematologic/Lymphatic: bleeding problems, blood clots or swollen lymph nodes Endocrine: temperature intolerance or unexpected weight changes Breast: new or changing breast lumps or nipple discharge Resp: cough, shortness of breath, or wheezing CV: chest pain or dyspnea on exertion GI: as per HPI GU: dysuria, trouble voiding, or hematuria MSK: joint pain or joint stiffness Neuro: TIA or stroke symptoms Derm: pruritus and skin lesion changes Psych: anxiety and depression  Objective   PE Blood pressure (!) 158/86, pulse 80, temperature 98.9 F (37.2 C), temperature source Oral, resp. rate 18, height 5\' 1"  (1.549 m), weight (!) 138.8 kg, SpO2 97%. Constitutional: NAD; conversant; no deformities Eyes: Moist conjunctiva; no lid lag; anicteric; PERRL Neck: Trachea midline; no thyromegaly Lungs: Normal respiratory effort; no tactile fremitus CV: RRR; no palpable thrills; no pitting edema GI: Abd Soft, nontender; no palpable hepatosplenomegaly MSK: Normal range of motion of extremities; no clubbing/cyanosis Psychiatric: Appropriate affect; alert and oriented x3 Lymphatic: No palpable cervical or axillary lymphadenopathy  Results for orders placed or performed during the hospital encounter of 09/16/23 (from the past 24 hour(s))  Glucose, capillary     Status: Abnormal   Collection Time: 09/16/23  9:38 AM  Result Value Ref Range   Glucose-Capillary 102 (H) 70 - 99 mg/dL    Imaging Orders  No imaging studies ordered today     Assessment and Plan   Sarah Dodson is a 62 y.o. female who completed the preoperative pathway for robotic conversion of sleeve gastrectomy to single anastomosis  duodenoileostomy with upper endoscopy and cholecystectomy. We discussed the procedure itself, as well as its risks, benefits and alternatives. We discussed the expected excess and total weight loss 87% and 38% at 5 years and 80% and 34% at 10 years. We discussed the technical difficulties of creating a duodenal anastomosis but the benefit of sparing the pylorus to reduce ulcer, bile reflux and dumping syndrome issues. I explained the risk of internal hernia. We discussed the importance of life long adherence to the diet and vitamin regimen to avoid vitamin deficiencies and malnutrition. We discussed the changes in bowel habits with risks of diarrhea and foul smelling flatus. We discussed the procedure, its risks, benefits and alternatives.  After a full discussion and all questions answered, the patient granted consent to proceed.      Quentin Ore, MD  [facility] Use [email or web address] to contact on call provider

## 2023-09-16 NOTE — Anesthesia Postprocedure Evaluation (Signed)
Anesthesia Post Note  Patient: Sarah Dodson  Procedure(s) Performed: XI ROBOT ASSISTED DUODENAL SWITCH, CONVERSION OF SLEEVE GASTRECTOMY UPPER GI ENDOSCOPY     Patient location during evaluation: PACU Anesthesia Type: General Level of consciousness: awake and alert Pain management: pain level controlled Vital Signs Assessment: post-procedure vital signs reviewed and stable Respiratory status: spontaneous breathing, nonlabored ventilation, respiratory function stable and patient connected to nasal cannula oxygen Cardiovascular status: blood pressure returned to baseline and stable Postop Assessment: no apparent nausea or vomiting Anesthetic complications: no   No notable events documented.  Last Vitals:  Vitals:   09/16/23 1700 09/16/23 1741  BP: 133/81 (!) 156/78  Pulse: 85 87  Resp: 15 17  Temp: 36.6 C 36.6 C  SpO2: 98% 100%    Last Pain:  Vitals:   09/16/23 1741  TempSrc: Oral  PainSc:                  Nelle Don Dillie Burandt

## 2023-09-16 NOTE — Plan of Care (Signed)
  Problem: Education: Goal: Knowledge of General Education information will improve Description: Including pain rating scale, medication(s)/side effects and non-pharmacologic comfort measures Outcome: Progressing   Problem: Activity: Goal: Risk for activity intolerance will decrease Outcome: Progressing   

## 2023-09-16 NOTE — Progress Notes (Signed)
PHARMACY CONSULT FOR:  Risk Assessment for Post-Discharge VTE Following Bariatric Surgery  Procedure* robotic assisted single anastomosis duodenoileostomy after previous sleeve gastrectomy, upper GI endoscopy  Sex F  Black race Y  Age (years) 62  BMI (kg/m2) 57.82  Operation duration (minutes) 161  History of VTE requiring treatment* N  Hypercoagulable condition* N  Liver disorder* N  Pre-op venous stasis N  Pre-op functional health status Independent  Previous foregut or bariatric surg Y  Post-op surgical site infection N  Transfusion intra- or post-op* N  Unplanned readmission N  Unplanned reoperation N  GI perforation/leak/obstruction* N  *specific risk factors for portomesenteric venous thrombosis   Predicted probability of 30-day post-discharge VTE:    0.9 % estimated using the [facility] Calculator  Other patient-specific factors to consider: *used surgical group of "sleeve" in calculation above at request of surgeon   Recommendation for Discharge: Enoxaparin 60 mg [place] q12h x 2 weeks post-discharge     Kelene Mouradian is a 62 y.o. female who underwent robotic assisted single anastomosis duodenoileostomy after previous sleeve gastrectomy, upper GI endoscopy on 09/16/2023.    Case start: 1226 Case end: 1507   Allergies  Allergen Reactions   Latex Itching    Patient Measurements: Height: 5\' 1"  (154.9 cm) Weight: (!) 138.8 kg (306 lb) IBW/kg (Calculated) : 47.8 Body mass index is 57.82 kg/m.  No results for input(s): "WBC", "HGB", "HCT", "PLT", "APTT", "CREATININE", "LABCREA", "CREAT24HRUR", "MG", "PHOS", "ALBUMIN", "PROT", "AST", "ALT", "ALKPHOS", "BILITOT", "BILIDIR", "IBILI" in the last 72 hours. Estimated Creatinine Clearance: 96.9 mL/min (by C-G formula based on SCr of 0.65 mg/dL).    Past Medical History:  Diagnosis Date   Arthritis    Asthma    Bronchitis    Constipation    Depression    Esophagitis    GERD  (gastroesophageal reflux disease)    Hypertension    Joint pain    Lactose intolerance    Obesity    PONV (postoperative nausea and vomiting)    nausea with the LEFT ankle surgery   Pre-diabetes    SOB (shortness of breath)      Medications Prior to Admission  Medication Sig Dispense Refill Last Dose   albuterol (PROVENTIL HFA;VENTOLIN HFA) 108 (90 BASE) MCG/ACT inhaler Inhale 2 puffs into the lungs every 6 (six) hours as needed for wheezing or shortness of breath.   Past Week   Apoaequorin (PREVAGEN PO) Take 1 capsule by mouth daily.   Past Week   Calcium Carbonate-Vit D-Min (CALTRATE 600+D PLUS MINERALS) 600-800 MG-UNIT CHEW Chew 1 each by mouth 3 (three) times daily.   Past Week   fluticasone (FLONASE) 50 MCG/ACT nasal spray Place 1 spray into both nostrils daily as needed for allergies or rhinitis.   Past Week   Fluticasone-Salmeterol (ADVAIR) 250-50 MCG/DOSE AEPB Inhale 1 puff into the lungs 2 (two) times daily.   09/16/2023   hydrochlorothiazide (HYDRODIURIL) 12.5 MG tablet Take 25 mg by mouth every morning.   09/15/2023   loratadine (CLARITIN) 10 MG tablet Take 10 mg by mouth daily.   Past Week   metFORMIN (GLUCOPHAGE-XR) 500 MG 24 hr tablet TAKE 1 TABLET BY MOUTH DAILY WITH SUPPER. 90 tablet 1 09/15/2023   Multiple Vitamins-Minerals (ADULT ONE DAILY GUMMIES PO) Take 2 tablets by mouth daily. Alive Women's 50+ (Gummy)   09/15/2023   NUTRITIONAL SUPPLEMENTS PO Take 1 Dose by mouth daily. Extra Strength Liquid  Collagen      pantoprazole (  PROTONIX) 40 MG tablet Take 40 mg by mouth daily.   09/16/2023 at 0530   Vitamin D, Ergocalciferol, (DRISDOL) 1.25 MG (50000 UNIT) CAPS capsule Take 1 capsule (50,000 Units total) by mouth every 14 (fourteen) days. (Patient not taking: Reported on 09/08/2023) 5 capsule 0 Not Taking       Jamse Mead 09/16/2023,4:09 PM

## 2023-09-16 NOTE — Anesthesia Procedure Notes (Signed)
Procedure Name: Intubation Date/Time: 09/16/2023 12:07 PM  Performed by: Carloyn Manner, CRNAPre-anesthesia Checklist: Patient identified, Emergency Drugs available, Suction available, Patient being monitored and Timeout performed Patient Re-evaluated:Patient Re-evaluated prior to induction Oxygen Delivery Method: Circle system utilized Preoxygenation: Pre-oxygenation with 100% oxygen Induction Type: IV induction Ventilation: Mask ventilation without difficulty Laryngoscope Size: Glidescope and 3 Grade View: Grade II Tube type: Oral Tube size: 7.0 mm Number of attempts: 1 Airway Equipment and Method: Stylet and Video-laryngoscopy Placement Confirmation: ETT inserted through vocal cords under direct vision, positive ETCO2 and CO2 detector Secured at: 21 cm Tube secured with: Tape Dental Injury: Teeth and Oropharynx as per pre-operative assessment

## 2023-09-16 NOTE — Transfer of Care (Signed)
Immediate Anesthesia Transfer of Care Note  Patient: Sarah Dodson  Procedure(s) Performed: XI ROBOT ASSISTED DUODENAL SWITCH, CONVERSION OF SLEEVE GASTRECTOMY UPPER GI ENDOSCOPY  Patient Location: PACU  Anesthesia Type:General  Level of Consciousness: awake and alert   Airway & Oxygen Therapy: Patient Spontanous Breathing and Patient connected to nasal cannula oxygen  Post-op Assessment: Report given to RN and Post -op Vital signs reviewed and stable  Post vital signs: Reviewed and stable  Last Vitals:  Vitals Value Taken Time  BP 128/68 09/16/23 1517  Temp 36.2   Pulse 87 09/16/23 1519  Resp 18 09/16/23 1519  SpO2 99 % 09/16/23 1519  Vitals shown include unfiled device data.  Last Pain:  Vitals:   09/16/23 0924  TempSrc: Oral         Complications: No notable events documented.

## 2023-09-16 NOTE — Anesthesia Preprocedure Evaluation (Signed)
Anesthesia Evaluation  Patient identified by MRN, date of birth, ID band Patient awake    Reviewed: Allergy & Precautions, NPO status , Patient's Chart, lab work & pertinent test results  History of Anesthesia Complications (+) PONV and history of anesthetic complications  Airway Mallampati: III  TM Distance: >3 FB Neck ROM: Full    Dental no notable dental hx.    Pulmonary asthma , former smoker   Pulmonary exam normal        Cardiovascular hypertension, Pt. on medications  Rhythm:Regular Rate:Normal     Neuro/Psych    Depression    negative neurological ROS     GI/Hepatic Neg liver ROS,GERD  Medicated,,  Endo/Other  diabetes, Type 2, Oral Hypoglycemic Agents  Class 4 obesity  Renal/GU negative Renal ROS  negative genitourinary   Musculoskeletal  (+) Arthritis , Osteoarthritis,    Abdominal Normal abdominal exam  (+)   Peds  Hematology Lab Results      Component                Value               Date                      WBC                      11.6 (H)            09/09/2023                HGB                      14.0                09/09/2023                HCT                      44.3                09/09/2023                MCV                      89.1                09/09/2023                PLT                      394                 09/09/2023              Anesthesia Other Findings   Reproductive/Obstetrics                             Anesthesia Physical Anesthesia Plan  ASA: 3  Anesthesia Plan: General   Post-op Pain Management: Tylenol PO (pre-op)*   Induction: Intravenous  PONV Risk Score and Plan: 4 or greater and Ondansetron, Dexamethasone, Midazolam and Treatment may vary due to age or medical condition  Airway Management Planned: Mask, Oral ETT and Video Laryngoscope Planned  Additional Equipment: None  Intra-op Plan:   Post-operative Plan: Extubation  in OR  Informed Consent: I have reviewed the patients  History and Physical, chart, labs and discussed the procedure including the risks, benefits and alternatives for the proposed anesthesia with the patient or authorized representative who has indicated his/her understanding and acceptance.     Dental advisory given  Plan Discussed with: CRNA  Anesthesia Plan Comments:        Anesthesia Quick Evaluation

## 2023-09-17 ENCOUNTER — Telehealth (HOSPITAL_COMMUNITY): Payer: Self-pay | Admitting: Pharmacy Technician

## 2023-09-17 ENCOUNTER — Other Ambulatory Visit: Payer: Self-pay

## 2023-09-17 ENCOUNTER — Other Ambulatory Visit (HOSPITAL_COMMUNITY): Payer: Self-pay

## 2023-09-17 ENCOUNTER — Encounter (HOSPITAL_COMMUNITY): Payer: Self-pay | Admitting: Surgery

## 2023-09-17 LAB — CBC WITH DIFFERENTIAL/PLATELET
Abs Immature Granulocytes: 0.04 10*3/uL (ref 0.00–0.07)
Basophils Absolute: 0 10*3/uL (ref 0.0–0.1)
Basophils Relative: 0 %
Eosinophils Absolute: 0 10*3/uL (ref 0.0–0.5)
Eosinophils Relative: 0 %
HCT: 43.1 % (ref 36.0–46.0)
Hemoglobin: 13.6 g/dL (ref 12.0–15.0)
Immature Granulocytes: 0 %
Lymphocytes Relative: 10 %
Lymphs Abs: 1 10*3/uL (ref 0.7–4.0)
MCH: 28.1 pg (ref 26.0–34.0)
MCHC: 31.6 g/dL (ref 30.0–36.0)
MCV: 89 fL (ref 80.0–100.0)
Monocytes Absolute: 0.7 10*3/uL (ref 0.1–1.0)
Monocytes Relative: 7 %
Neutro Abs: 8.5 10*3/uL — ABNORMAL HIGH (ref 1.7–7.7)
Neutrophils Relative %: 83 %
Platelets: 386 10*3/uL (ref 150–400)
RBC: 4.84 MIL/uL (ref 3.87–5.11)
RDW: 13.9 % (ref 11.5–15.5)
WBC: 10.3 10*3/uL (ref 4.0–10.5)
nRBC: 0 % (ref 0.0–0.2)

## 2023-09-17 MED ORDER — ENOXAPARIN (LOVENOX) PATIENT EDUCATION KIT
PACK | Freq: Once | Status: AC
  Filled 2023-09-17: qty 1

## 2023-09-17 NOTE — Plan of Care (Signed)
  Problem: Education: Goal: Knowledge of General Education information will improve Description: Including pain rating scale, medication(s)/side effects and non-pharmacologic comfort measures Outcome: Progressing   Problem: Health Behavior/Discharge Planning: Goal: Ability to manage health-related needs will improve Outcome: Progressing   Problem: Pain Management: Goal: General experience of comfort will improve Outcome: Progressing

## 2023-09-17 NOTE — Progress Notes (Signed)
Patient alert and oriented, pain is controlled. Patient is tolerating fluids, advanced to protein shake today, patient is tolerating well. Discussed QI "Goals for Discharge" document with patient including ambulation in halls, Incentive Spirometry use every hour, and oral care.  Also discussed pain and nausea control.  Enabled or verified head of bed 30 degree alarm activated.  BSTOP education provided including BSTOP information guide, "Guide for Pain Management after your Bariatric Procedure".  Diet progression education provided including "Bariatric Surgery Post-Op Food Plan Phase 1: Liquids".  Reviewed Bariatric surgery discharge instructions with patient and patient is able to articulate understanding. Provided information on BELT program, Support Group, BSTOP-D, and [facility] outpatient pharmacy. Communicated general update of patient status to surgeon. All questions answered. 24hr fluid recall is 540 mL  per hydration protocol, bariatric nurse coordinator to make follow-up phone call within one week.    Thank you,  Lubertha Basque, RN, MSN Bariatric Nurse Coordinator [phone] (office)

## 2023-09-17 NOTE — Progress Notes (Signed)
Progress Note: General Surgery Service   Chief Complaint/Subjective: Having pain, hasn't been up much Tolerating liquids without issue   Objective: Vital signs in last 24 hours: Temp:  [97.7 F (36.5 C)-99.1 F (37.3 C)] 98 F (36.7 C) (12/11 0619) Pulse Rate:  [80-95] 84 (12/11 0619) Resp:  [14-19] 18 (12/11 0619) BP: (126-161)/(66-86) 161/82 (12/11 0619) SpO2:  [92 %-100 %] 97 % (12/11 0619) Weight:  [138.8 kg] 138.8 kg (12/10 0924) Last BM Date : 09/16/23  Intake/Output from previous day: 12/10 0701 - 12/11 0700 In: 1988.8 [P.O.:420; I.V.:1418.8; IV Piggyback:150] Out: 2065 [Urine:1700; Drains:315; Blood:50] Intake/Output this shift: No intake/output data recorded.  GI: Abd soft, incisions c/d/I w/ glue  Lab Results: CBC  Recent Labs    09/16/23 1804 09/16/23 2029 09/17/23 0428  WBC 14.1*  --  10.3  HGB 14.1 13.2 13.6  HCT 44.2 40.9 43.1  PLT 361  --  386   BMET Recent Labs    09/16/23 1804  CREATININE 0.79   PT/INR No results for input(s): "LABPROT", "INR" in the last 72 hours. ABG No results for input(s): "PHART", "HCO3" in the last 72 hours.  Invalid input(s): "PCO2", "PO2"  Anti-infectives: Anti-infectives (From admission, onward)    Start     Dose/Rate Route Frequency Ordered Stop   09/16/23 0915  cefoTEtan (CEFOTAN) 2 g in sodium chloride 0.9 % 100 mL IVPB        2 g 200 mL/hr over 30 Minutes Intravenous On call to O.R. 09/16/23 0906 09/16/23 1225       Medications: Scheduled Meds:  acetaminophen  1,000 mg Oral Q8H   Or   acetaminophen (TYLENOL) oral liquid 160 mg/5 mL  1,000 mg Oral Q8H   enoxaparin   Does not apply Once   gabapentin  100 mg Oral Q12H   heparin injection (subcutaneous)  5,000 Units Subcutaneous Q8H   loratadine  10 mg Oral Daily   mometasone-formoterol  2 puff Inhalation BID   pantoprazole (PROTONIX) IV  40 mg Intravenous QHS   Ensure Max Protein  2 oz Oral Q2H   Continuous Infusions:  lactated ringers 75 mL/hr  at 09/16/23 1745   PRN Meds:.albuterol, fluticasone, hydrALAZINE, metoprolol tartrate, morphine injection, ondansetron (ZOFRAN) IV, oxyCODONE, simethicone  Assessment/Plan: s/p Procedure(s): XI ROBOT ASSISTED DUODENAL SWITCH, CONVERSION OF SLEEVE GASTRECTOMY UPPER GI ENDOSCOPY 09/16/2023  Doing well POD1 Ready for discharge once pain is better controlled - likely tomorrow   LOS: 1 day    Quentin Ore, MD  [facility] Use [email or web address] to contact on call provider  Daily Billing: [id number] - post op

## 2023-09-17 NOTE — Telephone Encounter (Signed)
Patient Product/process development scientist completed.    The patient is insured through [facility]. Patient has [employer], may use a copay card, and/or apply for patient assistance if available.    Ran test claim for enoxaparin (Lovenox) 60mg /0.6 ml and the current 14 day co-pay is $15.00.   This test claim was processed through [facility]- copay amounts may vary at other pharmacies due to pharmacy/plan contracts, or as the patient moves through the different stages of their insurance plan.     Roland Earl, CPHT Pharmacy Technician III Certified Patient Advocate [facility] Patient Advocate Team Direct Number: [phone]  Fax: [phone]

## 2023-09-18 ENCOUNTER — Other Ambulatory Visit (HOSPITAL_COMMUNITY): Payer: Self-pay

## 2023-09-18 LAB — CBC WITH DIFFERENTIAL/PLATELET
Abs Immature Granulocytes: 0.04 10*3/uL (ref 0.00–0.07)
Basophils Absolute: 0 10*3/uL (ref 0.0–0.1)
Basophils Relative: 0 %
Eosinophils Absolute: 0.1 10*3/uL (ref 0.0–0.5)
Eosinophils Relative: 1 %
HCT: 39.7 % (ref 36.0–46.0)
Hemoglobin: 12.8 g/dL (ref 12.0–15.0)
Immature Granulocytes: 1 %
Lymphocytes Relative: 19 %
Lymphs Abs: 1.6 10*3/uL (ref 0.7–4.0)
MCH: 28.6 pg (ref 26.0–34.0)
MCHC: 32.2 g/dL (ref 30.0–36.0)
MCV: 88.6 fL (ref 80.0–100.0)
Monocytes Absolute: 0.6 10*3/uL (ref 0.1–1.0)
Monocytes Relative: 7 %
Neutro Abs: 5.9 10*3/uL (ref 1.7–7.7)
Neutrophils Relative %: 72 %
Platelets: 325 10*3/uL (ref 150–400)
RBC: 4.48 MIL/uL (ref 3.87–5.11)
RDW: 14 % (ref 11.5–15.5)
WBC: 8.3 10*3/uL (ref 4.0–10.5)
nRBC: 0 % (ref 0.0–0.2)

## 2023-09-18 MED ORDER — PANTOPRAZOLE SODIUM 40 MG PO TBEC
40.0000 mg | DELAYED_RELEASE_TABLET | Freq: Every day | ORAL | 0 refills | Status: AC
  Filled 2023-09-18: qty 90, 90d supply, fill #0

## 2023-09-18 MED ORDER — GABAPENTIN 100 MG PO CAPS
100.0000 mg | ORAL_CAPSULE | Freq: Two times a day (BID) | ORAL | 0 refills | Status: DC
  Filled 2023-09-18: qty 10, 5d supply, fill #0

## 2023-09-18 MED ORDER — DIPHENHYDRAMINE HCL 12.5 MG/5ML PO ELIX: 12.5000 mg | ORAL_SOLUTION | Freq: Once | ORAL | Status: DC

## 2023-09-18 MED ORDER — ACETAMINOPHEN 500 MG PO TABS: 1000.0000 mg | ORAL_TABLET | Freq: Three times a day (TID) | ORAL | Status: AC

## 2023-09-18 MED ORDER — ONDANSETRON 4 MG PO TBDP
4.0000 mg | ORAL_TABLET | Freq: Four times a day (QID) | ORAL | 0 refills | Status: AC | PRN
  Filled 2023-09-18: qty 20, 5d supply, fill #0

## 2023-09-18 MED ORDER — OXYCODONE HCL 5 MG PO TABS
5.0000 mg | ORAL_TABLET | Freq: Four times a day (QID) | ORAL | 0 refills | Status: DC | PRN
  Filled 2023-09-18: qty 10, 3d supply, fill #0

## 2023-09-18 NOTE — Progress Notes (Signed)
Discharge medications in a secure  bag delivered to pt in room by this RN

## 2023-09-18 NOTE — Plan of Care (Signed)
  Problem: Activity: Goal: Risk for activity intolerance will decrease Outcome: Progressing   Problem: Nutrition: Goal: Adequate nutrition will be maintained Outcome: Adequate for Discharge   Problem: Coping: Goal: Level of anxiety will decrease Outcome: Adequate for Discharge

## 2023-09-18 NOTE — TOC CM/SW Note (Signed)
Transition of Care [facility]) - Inpatient Brief Assessment   Patient Details  Name: Sarah Dodson MRN: [id number] Date of Birth: [birth year]  Transition of Care [facility] CM/SW Contact:    Darleene Cleaver, LCSW Phone Number: 09/18/2023, 9:56 AM   Clinical Narrative:  Patient has a PCP, insurance, and does not have any SDOH needs.  TOC signing off.  Transition of Care Asessment: Insurance and Status: Insurance coverage has been reviewed Patient has primary care physician: Yes Home environment has been reviewed: Yes Prior level of function:: Indep Prior/Current Home Services: No current home services Social Drivers of Health Review: SDOH reviewed no interventions necessary Readmission risk has been reviewed: Yes Transition of care needs: no transition of care needs at this time

## 2023-09-18 NOTE — Discharge Instructions (Signed)
Bariatric Surgery  Home Care Instructions  These instructions are to help you care for yourself when you go home.  Call: If you have any problems. Call [phone] and ask for the surgeon on call If you have an emergency related to your surgery please use the ER at [facility].  Tell the ER staff that you are a new post-op gastric bypass or gastric sleeve patient   Signs and symptoms to report: Severe vomiting or nausea If you cannot handle clear liquids for longer than 1 day, call your surgeon  Abdominal pain which does not get better after taking your pain medication Fever greater than 100.4 F and chills Heart rate over 100 beats a minute Trouble breathing Chest pain  Redness, swelling, drainage, or foul odor at incision (surgical) sites  If your incisions open or pull apart Swelling or pain in calf (lower leg) Diarrhea (Loose bowel movements that happen often), frequent watery, uncontrolled bowel movements Constipation, (no bowel movements for 3 days) if this happens:  Take Milk of Magnesia, 2 tablespoons by mouth, 3 times a day for 2 days if needed Stop taking Milk of Magnesia once you have had a bowel movement Call your doctor if constipation continues Or Take Miralax  (instead of Milk of Magnesia) following the label instructions Stop taking Miralax once you have had a bowel movement Call your doctor if constipation continues Anything you think is "abnormal for you"   Normal side effects after surgery: Unable to sleep at night or unable to concentrate Irritability Being tearful (crying) or depressed These are common complaints, possibly related to your anesthesia, stress of surgery and change in lifestyle, that usually go away a few weeks after surgery.  If these feelings continue, call your medical doctor.  Wound Care: You may have surgical glue, steri-strips, or staples over your incisions after surgery Surgical glue:  Looks like a clear film over your incisions and will  wear off a little at a time Steri-strips : Adhesive strips of tape over your incisions. You may notice a yellowish color on the skin under the steri-strips. This is used to make the   steri-strips stick better. Do not pull the steri-strips off - let them fall off Staples: Staples may be removed before you leave the hospital If you go home with staples, call [facility] at for an appointment with your surgeon's nurse to have staples removed 10 days after surgery, [phone] Showering: You may shower two (2) days after your surgery unless your surgeon tells you differently Wash gently around incisions with warm soapy water, rinse well, and gently pat dry  If you have a drain (tube from your incision), you may need someone to hold this while you shower  No tub baths until staples are removed and incisions are healed     Medications: Medications should be liquid or crushed if larger than the size of a dime Extended release pills (medication that releases a little bit at a time through the day) should not be crushed Depending on the size and number of medications you take, you may need to space (take a few throughout the day)/change the time you take your medications so that you do not over-fill your pouch (smaller stomach) Make sure you follow-up with your primary care physician to make medication changes needed during rapid weight loss and life-style changes If you have diabetes, follow up with the doctor that orders your diabetes medication(s) within one week after surgery and check your blood  sugar regularly. Do not drive while taking narcotics (pain medications) DO NOT take NSAID'S (Examples of NSAID's include ibuprofen, naproxen)  Diet:                    First 2 Weeks  You will see the nutritionist about two (2) weeks after your surgery. The nutritionist will increase the types of foods you can eat if you are handling liquids well: If you have severe vomiting or nausea and cannot  handle clear liquids lasting longer than 1 day, call your surgeon  Protein Shake Drink at least 2 ounces of shake 5-6 times per day Each serving of protein shakes (usually 8 - 12 ounces) should have a minimum of:  15 grams of protein  And no more than 5 grams of carbohydrate  Goal for protein each day: 80 to 100 grams per day Protein powder may be added to fluids such as non-fat milk or Lactaid milk or Soy milk (limit to 35 grams added protein powder per serving)  Hydration Slowly increase the amount of water and other clear liquids as tolerated (See Acceptable Fluids) Slowly increase the amount of protein shake as tolerated   Sip fluids slowly and throughout the day May use sugar substitutes in small amounts (no more than 6 - 8 packets per day; i.e. Splenda)  Fluid Goal The first goal is to drink at least 8 ounces of protein shake/drink per day (or as directed by the nutritionist);  See handout from pre-op Bariatric Education Class for examples of protein shake/drink.   Slowly increase the amount of protein shake you drink as tolerated You may find it easier to slowly sip shakes throughout the day It is important to get your proteins in first Your fluid goal is to drink 64 - 100 ounces of fluid daily It may take a few weeks to build up to this 32 oz (or more) should be clear liquids  And  32 oz (or more) should be full liquids (see below for examples) Liquids should not contain sugar, caffeine, or carbonation  Clear Liquids: Water or Sugar-free flavored water (i.e. Fruit H2O, Propel) Decaffeinated coffee or tea (sugar-free) Crystal Lite, Wyler's Lite, Minute Maid Lite Sugar-free Jell-O Bouillon or broth Sugar-free Popsicle:   *Less than 20 calories each; Limit 1 per day  Full Liquids: Protein Shakes/Drinks + 2 choices per day of other full liquids Full liquids must be: No More Than 12 grams of Carbs per serving  No More Than 3 grams of Fat per serving Strained low-fat cream  soup Non-Fat milk Fat-free Lactaid Milk Sugar-free yogurt (Dannon Lite & Fit, Greek yogurt)      Vitamins and Minerals Start 1 day after surgery unless otherwise directed by your surgeon Bariatric Specific Complete Multivitamins Chewable Calcium Citrate with Vitamin D-3 (Example: 3 Chewable Calcium Plus 600 with Vitamin D-3) Take 500 mg three (3) times a day for a total of 1500 mg each day Do not take all 3 doses of calcium at one time as it may cause constipation, and you can only absorb 500 mg  at a time  Do not mix multivitamins containing iron with calcium supplements; take 2 hours apart  Menstruating women and those at risk for anemia (a blood disease that causes weakness) may need extra iron Talk with your doctor to see if you need more iron If you need extra iron: Total daily Iron recommendation (including Vitamins) is 50 to 100 mg Iron/day Do not stop taking or change  any vitamins or minerals until you talk to your nutritionist or surgeon Your nutritionist and/or surgeon must approve all vitamin and mineral supplements   Activity and Exercise: It is important to continue walking at home.  Limit your physical activity as instructed by your doctor.  During this time, use these guidelines: Do not lift anything greater than ten (10) pounds for at least two (2) weeks Do not go back to work or drive until Designer, industrial/product says you can You may have sex when you feel comfortable  It is VERY important for female patients to use a reliable birth control method; fertility often increases after surgery  Do not get pregnant for at least 18 months Start exercising as soon as your doctor tells you that you can Make sure your doctor approves any physical activity Start with a simple walking program Walk 5-15 minutes each day, 7 days per week.  Slowly increase until you are walking 30-45 minutes per day Consider joining our BELT program. [phone] or email [email or web address]   Special  Instructions Things to remember:  Use your CPAP when sleeping if this applies to you, do not stop the use of CPAP unless directed by physician after a sleep study [facility] has a free Bariatric Surgery Support Group that meets monthly, the 3rd Thursday, 6 pm.  Please review discharge information for date and location of this meeting. It is very important to keep all follow up appointments with your surgeon, nutritionist, primary care physician, and behavioral health practitioner After the first year, please follow up with your bariatric surgeon and nutritionist at least once a year in order to maintain best weight loss results   [facility]: [phone] [facility]: [phone] Bariatric Nurse Coordinator: [phone]

## 2023-09-18 NOTE — Discharge Summary (Signed)
Patient ID: Sarah Dodson [id number] 62 y.o. 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 [facility]) 07/07/2023   Insulin resistance 04/08/2023   Prediabetes 02/12/2023   Hyperlipidemia 01/22/2023   Vitamin D deficiency 01/22/2023   Primary hypertension 11/27/2022   Central adiposity 11/27/2022   S/P laparoscopic sleeve gastrectomy 11/27/2022   Colon cancer screening 08/06/2022   Morbid obesity with starting BMI 59 07/24/2020   Spondylolisthesis, lumbar region 12/22/2017   Total knee replacement status, right 04/16/2017   Unilateral primary osteoarthritis, right knee 09/26/2016     Discharge Diagnoses:  Patient Active Problem List   Diagnosis Date Noted   Morbid obesity with BMI of 50.0-59.9, adult (HCC) 09/16/2023   Sleeps in sitting position due to orthopnea 07/07/2023   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 [facility]) 07/07/2023   Insulin resistance 04/08/2023   Prediabetes 02/12/2023   Hyperlipidemia 01/22/2023   Vitamin D deficiency 01/22/2023   Primary hypertension 11/27/2022   Central adiposity 11/27/2022   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  Consults: None  Significant Diagnostic Studies: None  Treatments: surgery: as above  Disposition: Home  Patient Instructions:  Allergies as of 09/18/2023       Reactions   Latex Itching        Medication List     STOP taking these medications    hydrochlorothiazide 12.5 MG tablet Commonly known as: HYDRODIURIL       TAKE these medications    acetaminophen 500 MG tablet Commonly known as: TYLENOL Take 2 tablets (1,000 mg total) by mouth every 8 (eight) hours for 5 days.   ADULT ONE DAILY GUMMIES PO Take 2 tablets by mouth daily. Alive Women's 50+ (Gummy)   albuterol 108 (90 Base) MCG/ACT inhaler Commonly known as: VENTOLIN HFA Inhale 2 puffs into the lungs every 6 (six) hours as needed for wheezing or shortness of breath.   Caltrate 600+D Plus Minerals 600-800 MG-UNIT Chew Chew 1 each by mouth 3 (three) times daily.   enoxaparin 60 MG/0.6ML injection Commonly known as: LOVENOX Inject 0.6 mLs (60 mg total) into the skin every 12 (twelve) hours for 14 days.   fluticasone 50 MCG/ACT nasal spray Commonly known as: FLONASE Place 1 spray into both nostrils daily as needed for allergies or rhinitis.   Fluticasone-Salmeterol 250-50 MCG/DOSE Aepb Commonly known as: ADVAIR Inhale 1 puff into the lungs 2 (two) times daily.   gabapentin 100 MG capsule Commonly known as: NEURONTIN Take 1 capsule (100 mg total) by mouth every 12 (twelve) hours for 5 days.   loratadine 10 MG tablet Commonly known as: CLARITIN Take 10 mg  by mouth daily.   metFORMIN 500 MG 24 hr tablet Commonly known as: GLUCOPHAGE-XR TAKE 1 TABLET BY MOUTH DAILY WITH SUPPER.   NUTRITIONAL SUPPLEMENTS PO Take 1 Dose by mouth daily. Extra Strength Liquid  Collagen    ondansetron 4 MG disintegrating tablet Commonly known as: ZOFRAN-ODT Take 1 tablet (4 mg total) by mouth every 6 (six) hours as needed for nausea or vomiting.   oxyCODONE 5 MG immediate release tablet Commonly known as: Oxy IR/ROXICODONE Take 1 tablet (5 mg total) by mouth every 6 (six) hours as needed for breakthrough pain or severe pain (pain score 7-10).   pantoprazole 40 MG tablet Commonly known as: PROTONIX Take 40 mg by mouth daily. What changed: Another medication with the same name was added. Make sure you understand how and when to take each.   pantoprazole 40 MG tablet Commonly known as: PROTONIX Take 1 tablet (40 mg total) by mouth daily. What changed: You were already taking a medication with the same name, and this prescription was added. Make sure you understand how and when to take each.   PREVAGEN PO Take 1 capsule by mouth daily.   Vitamin D (Ergocalciferol) 1.25 MG (50000 UNIT) Caps capsule Commonly known as: DRISDOL Take 1 capsule (50,000 Units total) by mouth every 14 (fourteen) days.       ASK your doctor about these medications    enoxaparin Kit Commonly known as: LOVENOX 1 kit by Does not apply route once for 1 dose. Ask about: Should I take this medication?        Activity: no heavy lifting for 4 weeks Diet:  Bariatric diet protocol Wound Care: keep wound clean and dry  Follow-up:  With Dr. Dossie Der for JP drain removal  Signed: Hyman Hopes Lavi Sheehan [facility] Bariatric, [facility] Minimally Invasive [facility], [place]   09/18/2023, 8:40 AM

## 2023-09-18 NOTE — Progress Notes (Signed)
AVS reviewed w/ pt  who verbalized an understanding. PIV removed as noted. Pt self administered lovenox at 11am. Pt dressed. Waiting on ride. Welts noted at lap sites pt states she had itching the last time dermabond was used. Surgical team notified via chat - ok for pt to take  benadryl at home. Dermanond added to allergy list.

## 2023-09-23 ENCOUNTER — Telehealth (HOSPITAL_COMMUNITY): Payer: Self-pay | Admitting: *Deleted

## 2023-09-23 NOTE — Telephone Encounter (Signed)
1. Tell me about your pain and pain management?     Pt denies any pain.    2. Let's talk about fluid intake. How much total fluid are you taking in?   Pt states that s/he is getting in at least 80oz of fluid including protein shakes, bottled water, and chicken soup   Pt instructed to assess status and suggestions daily utilizing Hydration Action Plan on discharge folder and to call [facility] if in the "red zone".     3. How much protein have you taken in the last day?     Pt states she working towards meeting her goal of 80g of protein each day with the protein shakes     4. Have you had nausea? Tell me about when you have experienced nausea and what you did to help?   Pt denies nausea.   5. Has the frequency or color changed with your urine?   Pt states that s/he is urinating "fine" with no changes in frequency or urgency.   6. Tell me what your incisions look like?   "Incisions look fine". Pt denies a fever, chills. Pt states incisions are not swollen, open, or draining. Pt encouraged to call [facility] if incisions change.     7. Have you been passing gas? BM?   Pt states that they are having BMs.   Pt states that they have had a BM. Pt instructed to take either Miralax or MoM as instructed per "Gastric Bypass/Sleeve Discharge Home Care Instructions". Pt to call surgeon's office if not able to have BM with medication.     8. If a problem or question were to arise who would you call? Do you know contact numbers for [facility], [facility], and [facility]?   Pt knows to call [facility] for surgical, [facility] for nutrition, and [facility] for non-urgent questions or concerns. Pt denies dehydration symptoms. Pt can describe s/sx of dehydration.   9. How has the walking going?   Pt states s/he is walking around and able to be active without difficulty.

## 2023-09-24 DIAGNOSIS — I10 Essential (primary) hypertension: Secondary | ICD-10-CM | POA: Diagnosis not present

## 2023-09-24 DIAGNOSIS — J4530 Mild persistent asthma, uncomplicated: Secondary | ICD-10-CM | POA: Diagnosis not present

## 2023-09-24 DIAGNOSIS — Z0000 Encounter for general adult medical examination without abnormal findings: Secondary | ICD-10-CM | POA: Diagnosis not present

## 2023-09-24 DIAGNOSIS — Z9889 Other specified postprocedural states: Secondary | ICD-10-CM | POA: Diagnosis not present

## 2023-09-29 ENCOUNTER — Encounter: Payer: BC Managed Care – PPO | Attending: Surgery | Admitting: Dietician

## 2023-09-29 DIAGNOSIS — E669 Obesity, unspecified: Secondary | ICD-10-CM | POA: Diagnosis not present

## 2023-09-29 NOTE — Progress Notes (Signed)
2 Week Post-Operative Nutrition Class   Patient was seen on 09/29/2023 for Post-Operative Nutrition education at the [facility].    Surgery date: 09/16/2023 Surgery type: Conversion from sleeve to duodenal switch  Anthropometrics  Start weight at [facility]: 311.1 lbs (date: 06/26/2023)  Height: 61 in BMI: 58.78 kg/m2     Clinical  Medical hx: sleeve gastrectomy, HTN, asthma, hypercholesterolemia, obesity Medications: hydroclorizide, pantimazole, advair, clutin, vit D, iron supplement, multivitamin, calcium, metformin Labs: A1c 5.8, ferritin, 193 Notable signs/symptoms: none noted Any previous deficiencies? No Bowel Habits: Every day to every other day no complaints   Body Composition Scale 09/29/2023  Current Body Weight 293.3  Total Body Fat % 52.3  Visceral Fat 28  Fat-Free Mass % 47.6   Total Body Water % 38.3  Muscle-Mass lbs 27.2  BMI 55.7  Body Fat Displacement          Torso  lbs 95.2         Left Leg  lbs 19.0         Right Leg  lbs 19.0         Left Arm  lbs 9.5         Right Arm  lbs 9.5      The following the learning objectives were met by the patient during this course: Identifies Soft Prepped Plan Advancement Guide  Identifies Soft, High Proteins (Phase 1), beginning 2 weeks post-operatively to 3 weeks post-operatively Identifies Additional Soft High Proteins, soft non-starchy vegetables, fruits and starches (Phase 2), beginning 3 weeks post-operatively to 3 months post-operatively Identifies appropriate sources of fluids, proteins, vegetables, fruits and starches Identifies appropriate fat sources and healthy verses unhealthy fat types   States protein, vegetable, fruit and starch recommendations and appropriate sources post-operatively Identifies the need for appropriate texture modifications, mastication, and bite sizes when consuming solids Identifies appropriate fat consumption and sources Identifies appropriate multivitamin  and calcium sources post-operatively Describes the need for physical activity post-operatively and will follow MD recommendations States when to call healthcare provider regarding medication questions or post-operative complications   Handouts given during class include: Soft Prepped Plan Advancement Guide   Follow-Up Plan: Patient will follow-up at [facility] in 10 weeks for 3 month post-op nutrition visit for diet advancement per MD.

## 2023-10-02 DIAGNOSIS — R300 Dysuria: Secondary | ICD-10-CM | POA: Diagnosis not present

## 2023-10-15 DIAGNOSIS — R300 Dysuria: Secondary | ICD-10-CM | POA: Diagnosis not present

## 2023-10-18 ENCOUNTER — Other Ambulatory Visit (INDEPENDENT_AMBULATORY_CARE_PROVIDER_SITE_OTHER): Payer: Self-pay | Admitting: Family Medicine

## 2023-10-18 DIAGNOSIS — R7303 Prediabetes: Secondary | ICD-10-CM

## 2023-10-31 DIAGNOSIS — Z23 Encounter for immunization: Secondary | ICD-10-CM | POA: Diagnosis not present

## 2023-11-18 ENCOUNTER — Encounter: Payer: Self-pay | Admitting: Dietician

## 2023-11-18 ENCOUNTER — Encounter: Payer: BC Managed Care – PPO | Attending: Surgery | Admitting: Dietician

## 2023-11-18 VITALS — Ht 61.0 in | Wt 292.3 lb

## 2023-11-18 DIAGNOSIS — E669 Obesity, unspecified: Secondary | ICD-10-CM | POA: Insufficient documentation

## 2023-11-18 NOTE — Progress Notes (Signed)
Bariatric Nutrition Follow-Up Visit Medical Nutrition Therapy  Appt Start Time: 1620   End Time: 1710  Surgery date: 09/16/2023 Surgery type: Conversion from sleeve to duodenal switch  NUTRITION ASSESSMENT   Anthropometrics  Start weight at [facility]: 311.1 lbs (date: 06/26/2023)  Height: 61 in Weight today: 292.3 lbs   Clinical  Medical hx: sleeve gastrectomy, HTN, asthma, hypercholesterolemia, obesity Medications: hydroclorizide, pantimazole, advair, clutin, vit D, iron supplement, multivitamin, calcium, metformin Labs: A1c 5.8, ferritin, 193 Notable signs/symptoms: none noted Any previous deficiencies? No Bowel Habits: Every day to every other day no complaints   Body Composition Scale 09/29/2023 11/18/2023  Current Body Weight 293.3 292.3  Total Body Fat % 52.3 52.2  Visceral Fat 28 28  Fat-Free Mass % 47.6 47.7   Total Body Water % 38.3 38.3  Muscle-Mass lbs 27.2 27.2  BMI 55.7 55.5  Body Fat Displacement           Torso  lbs 95.2 94.8         Left Leg  lbs 19.0 18.9         Right Leg  lbs 19.0 18.9         Left Arm  lbs 9.5 9.4         Right Arm  lbs 9.5 9.4     Lifestyle & Dietary Hx  Pt states she weighed last night on her scale, stating it was 286.8 lbs. Pt states she has seen some non-scale changes, stating she can see her entire steering wheel in her car.  Pt states she started [facility] a few weeks ago. Pt states she likes eggs after this surgery. Pt states she started Emergan-C supplement.  Estimated daily fluid intake: 64+ oz Estimated daily protein intake: at least 60 g (need aim for 80-100 grams per day) Supplements: multivitamin and calcium (4x per day); pt states she is also taking Emergan-C, stating that keeps her from getting sick. Current average weekly physical activity: [facility] 3 days per week for 60 minutes; walking more at work   24-Hr Dietary Recall First Meal: one egg (scrambled) Snack: fruit (pear or apple without skin) or sf jello or sf pudding  with protein powder, protein shake Second Meal: vegetable and a meat (chicken, lima bean, corn) Snack:  peanuts or thin triscuit Third Meal: meat and vegetable Snack: dark chocolate (2-3 Hershey kisses) Beverages: water,   Post-Op Goals/ Signs/ Symptoms Using straws: yes Drinking while eating: no Chewing/swallowing difficulties: no Changes in vision: no Changes to mood/headaches: no Hair loss/changes to skin/nails: no Difficulty focusing/concentrating: no Sweating: no Limb weakness: no Dizziness/lightheadedness: no Palpitations: no  Carbonated/caffeinated beverages: no N/V/D/C/Gas: no Abdominal pain: no Dumping syndrome: no   NUTRITION DIAGNOSIS  Overweight/obesity ([place]-3.3) related to past poor dietary habits and physical inactivity as evidenced by completed bariatric surgery and following dietary guidelines for continued weight loss and healthy nutrition status.   NUTRITION INTERVENTION Nutrition counseling (C-1) and education (E-2) to facilitate bariatric surgery goals, including: Diet advancement to the standard prep plan The importance of consuming adequate calories as well as certain nutrients daily due to the body's need for essential vitamins, minerals, and fats The importance of daily physical activity and to reach a goal of at least 150 minutes of moderate to vigorous physical activity weekly (or as directed by their physician) due to benefits such as increased musculature and improved lab values The importance of intuitive eating specifically learning hunger-satiety cues and understanding the importance of learning a new body: The importance of  mindful eating to avoid grazing behaviors  Purpose of protein: Every cell in your body has protein. Protein is essential for the structure, function and regulation of tissues and organs within the body. Without protein enzymes and antibodies would not exist, and cells would lack storage, transportation, and messenger systems.  According to [facility]. [employer], the body is made up of at least 10000 different proteins. Lack of protein can lead to growth failure in children, loss of muscle mass, decreased immune system function, and overall weakening of various organs in the body.  SearchEngineCritic.nl, DoubleProperty.com.cy, PokerProtocol.pl Why you need complex carbohydrates: Whole grains and other complex carbohydrates are required to have a healthy diet. Whole grains provide fiber which can help with blood glucose levels and help keep you satiated. Fruits and starchy vegetables provide essential vitamins and minerals required for immune function, eyesight support, brain support, bone density, wound healing and many other functions within the body. According to the current evidenced based 2020-2025 Dietary Guidelines for Americans, complex carbohydrates are part of a healthy eating pattern which is associated with a decreased risk for type 2 diabetes, cancers, and cardiovascular disease.   Goals New: track protein; aim for 80-100 grams per day; have a protein with every meal or snack New: track fluids; mostly plain water New: email picture of multivitamin  Handouts Provided Include  Protein Foods List (with protein value) Bariatric MyPlate  Learning Style & Readiness for Change Teaching method utilized: Visual & Auditory  Demonstrated degree of understanding via: Teach Back  Readiness Level: preparation Barriers to learning/adherence to lifestyle change: nothing identified  RD's Notes for Next Visit Assess adherence to pt chosen goals  MONITORING & EVALUATION Dietary intake, weekly physical activity, body weight.  Next Steps Patient is to follow-up in 3 months for 5 month post-op follow-up.

## 2023-12-19 DIAGNOSIS — D539 Nutritional anemia, unspecified: Secondary | ICD-10-CM | POA: Diagnosis not present

## 2023-12-19 DIAGNOSIS — Z9884 Bariatric surgery status: Secondary | ICD-10-CM | POA: Diagnosis not present

## 2024-01-19 DIAGNOSIS — S40262A Insect bite (nonvenomous) of left shoulder, initial encounter: Secondary | ICD-10-CM | POA: Diagnosis not present

## 2024-01-19 DIAGNOSIS — L299 Pruritus, unspecified: Secondary | ICD-10-CM | POA: Diagnosis not present

## 2024-02-16 ENCOUNTER — Encounter: Payer: Self-pay | Admitting: Dietician

## 2024-02-16 ENCOUNTER — Encounter: Payer: BC Managed Care – PPO | Attending: Surgery | Admitting: Dietician

## 2024-02-16 VITALS — Ht 61.0 in | Wt 290.3 lb

## 2024-02-16 DIAGNOSIS — E669 Obesity, unspecified: Secondary | ICD-10-CM | POA: Insufficient documentation

## 2024-02-16 NOTE — Progress Notes (Signed)
 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 No Bowel Habits: Every day to every other day no complaints   Body Composition Scale 09/29/2023 11/18/2023 02/16/2024  Current Body Weight 293.3 292.3 290.3  Total Body Fat % 52.3 52.2 52.1  Visceral Fat 28 28 28   Fat-Free Mass % 47.6 47.7 47.8   Total Body Water  % 38.3 38.3 38.4  Muscle-Mass lbs 27.2 27.2 27.1  BMI 55.7 55.5 55.1  Body Fat Displacement            Torso  lbs 95.2 94.8 93.9         Left Leg  lbs 19.0 18.9 18.7         Right Leg  lbs 19.0 18.9 18.7         Left Arm  lbs 9.5 9.4 9.3         Right Arm  lbs 9.5 9.4 9.3    Lifestyle & Dietary Hx  Pt states she hurt her ankle at church, stating she was about to fall and jumped 3 stairs to avoid falling. Pt [facility] states she is applying what she learned [facility] Pt states her clothes are fitting differently now, and is not worrying about the scale. Pt states she is not taking metformin  any more. Pt states her shoulder has been feeling better since she has been exercising her arms. Pt states she gets food that the dogs can eat too, stating they eat strawberries, blueberries and watermelon.  Estimated daily fluid intake: 64+ oz Estimated daily protein intake: at least 60 g (need aim for 80-100 grams per day) Supplements: multivitamin (with extra ADEK)  and calcium (4x per day), separate iron supplement; pt states she is also taking Emergan-C, stating that keeps her from getting sick. Current average weekly physical activity: 3 days a week at the [facility], 45-60+ minutes (swimming, stationary bike, weights)   24-Hr Dietary Recall First Meal: one egg (scrambled) Snack: fruit (pear or apple without skin) or sf jello or sf pudding with protein powder, protein shake Second Meal: vegetable and a meat (chicken, lima bean, corn) Snack:  peanuts or thin triscuit Third Meal: meat and vegetable Snack: dark chocolate (2-3 Hershey kisses) Beverages: water ,   Post-Op Goals/ Signs/ Symptoms Using straws: yes Drinking while eating: no Chewing/swallowing difficulties: no Changes in vision: no Changes to mood/headaches: no Hair loss/changes to skin/nails: no Difficulty focusing/concentrating: no Sweating: no Limb weakness: no Dizziness/lightheadedness: no Palpitations: no  Carbonated/caffeinated beverages: no N/V/D/C/Gas: no Abdominal pain: no Dumping syndrome: no   NUTRITION DIAGNOSIS  Overweight/obesity ([place]-3.3) related to past poor dietary habits and physical inactivity as evidenced by completed bariatric surgery and following dietary guidelines for continued weight loss and healthy nutrition status.   NUTRITION INTERVENTION Nutrition counseling (C-1) and education (E-2) to facilitate bariatric surgery goals, including: The importance of consuming adequate calories as well as certain nutrients daily due to the body's need for essential vitamins, minerals, and fats  The importance of daily physical activity and to reach a goal of at least 150 minutes of moderate to vigorous physical activity weekly (or as directed by their physician) due to benefits such as increased musculature and improved lab values The importance of intuitive eating specifically learning hunger-satiety cues and understanding the importance of learning a new body: The importance  of mindful eating to avoid grazing behaviors  Tracking your protein intake is extremely important after bariatric surgery.  Promotes Healing: Protein is essential for tissue repair and wound healing after surgery.    Preserves Muscle Mass: During rapid weight loss, your body can lose muscle along with fat. Adequate protein intake helps minimize muscle loss.    Increases Satiety: Protein helps you feel fuller for longer, which can aid in managing your calorie intake and preventing overeating.    Supports Metabolism: Maintaining muscle mass helps support a healthy metabolism.    Prevents Nutritional Deficiencies: Protein plays a role in various bodily functions, and sufficient intake is necessary for overall health.    Hair, Skin, and Nails: Adequate protein contributes to the health of your hair, skin, and nails.    How to Track Your Protein Intake: Read Food Labels Carefully: Pay attention to the "Protein" section on the nutrition facts label. Note the serving size and adjust accordingly if you eat more or less than the listed serving. Utilize Tracking Tools: Food Journal (Paper or Digital): Recording everything you eat and noting the protein content is a reliable method.    Mobile Apps: Many apps are designed for tracking macronutrients (protein, carbohydrates, and fats) and calories. Some popular options include: MyFitnessPal Lose It! Baritastic (often specifically designed for bariatric patients)    Fooducate     Goals Re-engage: track protein; aim for 80-100 grams per day; have a protein with every meal or snack Continue: track fluids; mostly plain water  Continue: increasing physical activity; go to the [employer] Provided Include    Learning Style & Readiness for Change Teaching method utilized: Visual & Auditory  Demonstrated degree of understanding via: Teach Back  Readiness Level: preparation Barriers to learning/adherence to lifestyle change: nothing identified  RD's Notes  for Next Visit Assess adherence to pt chosen goals  MONITORING & EVALUATION Dietary intake, weekly physical activity, body weight.  Next Steps Patient is to follow-up in 3 months for follow-up.

## 2024-02-20 ENCOUNTER — Ambulatory Visit: Admitting: Podiatry

## 2024-02-20 ENCOUNTER — Encounter: Payer: Self-pay | Admitting: Podiatry

## 2024-02-20 DIAGNOSIS — B351 Tinea unguium: Secondary | ICD-10-CM

## 2024-02-20 MED ORDER — TERBINAFINE HCL 250 MG PO TABS: 250.0000 mg | ORAL_TABLET | Freq: Every day | ORAL | 0 refills | Status: AC

## 2024-02-20 NOTE — Progress Notes (Signed)
 Chief Complaint  Patient presents with   Nail Problem    "I need to get this toenail fungus taken care of.  I've tried everything." N - toenail fungus L - 1-5 bilateral D - 20 + years O - progressed slowly C - thick, discolored, brittle, odor A - none T - OTC fungus medicine, Vicks Vapor Rub, Epsom Salt Soaks    Subjective: 63 y.o. female presenting today for new complaint of pain and tenderness associated to the toenails bilateral.  She has noticed thick discoloration brittleness to the toenails for 20+ years.  She has tried different topical medication with no improvement.  She is concerned for toenail fungus  Past Medical History:  Diagnosis Date   Arthritis    Asthma    Bronchitis    Constipation    Depression    Esophagitis    GERD (gastroesophageal reflux disease)    Hypertension    Joint pain    Lactose intolerance    Obesity    PONV (postoperative nausea and vomiting)    nausea with the LEFT ankle surgery   Pre-diabetes    SOB (shortness of breath)     Past Surgical History:  Procedure Laterality Date   ANKLE ARTHROSCOPY Left    left 2014/ right 2016   ANKLE ARTHROSCOPY Right 01/11/2015   Procedure: RIGHT ANKLE ARTHROSCOPY;  Surgeon: Timothy Ford, MD;  Location: [facility] OR;  Service: Orthopedics;  Laterality: Right;   BIOPSY  08/06/2022   Procedure: BIOPSY;  Surgeon: Baldo Bonds, MD;  Location: [facility];  Service: Gastroenterology;;   BIOPSY  07/18/2023   Procedure: BIOPSY;  Surgeon: Junie Olds, MD;  Location: [facility];  Service: General;;   BIOPSY BREAST Right    COLONOSCOPY WITH PROPOFOL  N/A 08/06/2022   Procedure: COLONOSCOPY WITH PROPOFOL ;  Surgeon: Baldo Bonds, MD;  Location: [facility];  Service: Gastroenterology;  Laterality: N/A;   ESOPHAGOGASTRODUODENOSCOPY N/A 07/18/2023   Procedure: ESOPHAGOGASTRODUODENOSCOPY (EGD);  Surgeon: Junie Olds, MD;  Location: [facility];  Service: General;  Laterality: N/A;    HYSTEROSCOPY WITH D & C N/A 09/05/2016   Procedure: DILATATION AND CURETTAGE /HYSTEROSCOPY;  Surgeon: Woodrow Hazy, MD;  Location: [facility];  Service: Gynecology;  Laterality: N/A;   LAPAROSCOPIC GASTRIC SLEEVE RESECTION N/A 07/24/2020   Procedure: LAPAROSCOPIC GASTRIC SLEEVE RESECTION; REPAIR OF HIATAL HERNIA;  Surgeon: Juanita Norlander, MD;  Location: [facility];  Service: General;  Laterality: N/A;   POLYPECTOMY  08/06/2022   Procedure: POLYPECTOMY;  Surgeon: Baldo Bonds, MD;  Location: [facility];  Service: Gastroenterology;;   TOTAL KNEE ARTHROPLASTY Right 04/16/2017   Procedure: RIGHT TOTAL KNEE ARTHROPLASTY;  Surgeon: Timothy Ford, MD;  Location[facility];  Service: Orthopedics;  Laterality: Right;   UPPER GI ENDOSCOPY N/A 07/24/2020   Procedure: UPPER GI ENDOSCOPY;  Surgeon: Juanita Norlander, MD;  Location: [facility];  Service: General;  Laterality: N/A;   UPPER GI ENDOSCOPY N/A 09/16/2023   Procedure: UPPER GI ENDOSCOPY;  Surgeon: Junie Olds, MD;  Location: [facility];  Service: General;  Laterality: N/A;    Allergies  Allergen Reactions   Latex Itching   Other Hives    Dermabond - welts around surgical sites     LT hallux nail plate 0/34/7425  Objective: Physical Exam General: The patient is alert and oriented x3 in no acute distress.  Dermatology: Hyperkeratotic, discolored, thickened, onychodystrophy noted. Skin is warm, dry and supple bilateral lower extremities. Negative for open lesions or macerations.  Vascular: Palpable  pedal pulses bilaterally. No edema or erythema noted. Capillary refill within normal limits.  Neurological: Grossly intact via light touch  Musculoskeletal Exam: Chronic severe DJD bilateral ankles  Assessment: #1 Onychomycosis of toenails bilateral  Plan of Care:  -Patient was evaluated. -Today we discussed different treatment options including oral, topical, and laser antifungal treatment modalities.  We discussed their efficacies and side  effects.  Patient opts for oral antifungal treatment modality -Prescription for Lamisil 250 mg #90 daily. Pt denies a history of liver pathology or symptoms.  CMP 09/09/2023 hepatic function WNL -Return to clinic 6 months  *Sewer for King Penning, DPM [facility]  Dr. Dot Gazella, DPM    [address] [place], [place] [address]                Office [phone]  Fax [phone]

## 2024-03-08 ENCOUNTER — Ambulatory Visit: Admitting: Podiatry

## 2024-03-12 DIAGNOSIS — M778 Other enthesopathies, not elsewhere classified: Secondary | ICD-10-CM | POA: Diagnosis not present

## 2024-03-12 DIAGNOSIS — M25531 Pain in right wrist: Secondary | ICD-10-CM | POA: Diagnosis not present

## 2024-03-29 DIAGNOSIS — R7303 Prediabetes: Secondary | ICD-10-CM | POA: Diagnosis not present

## 2024-03-29 DIAGNOSIS — Z6841 Body Mass Index (BMI) 40.0 and over, adult: Secondary | ICD-10-CM | POA: Diagnosis not present

## 2024-03-29 DIAGNOSIS — I10 Essential (primary) hypertension: Secondary | ICD-10-CM | POA: Diagnosis not present

## 2024-04-02 DIAGNOSIS — R6889 Other general symptoms and signs: Secondary | ICD-10-CM | POA: Diagnosis not present

## 2024-04-02 DIAGNOSIS — I10 Essential (primary) hypertension: Secondary | ICD-10-CM | POA: Diagnosis not present

## 2024-04-02 DIAGNOSIS — R3129 Other microscopic hematuria: Secondary | ICD-10-CM | POA: Diagnosis not present

## 2024-04-02 DIAGNOSIS — Z9884 Bariatric surgery status: Secondary | ICD-10-CM | POA: Diagnosis not present

## 2024-04-02 DIAGNOSIS — J4530 Mild persistent asthma, uncomplicated: Secondary | ICD-10-CM | POA: Diagnosis not present

## 2024-05-04 ENCOUNTER — Telehealth: Payer: Self-pay

## 2024-05-04 NOTE — Telephone Encounter (Signed)
 Patient dropped off handicap placard form to be filled out.

## 2024-05-05 NOTE — Telephone Encounter (Signed)
 Delivered to Dr. Crist team.

## 2024-05-18 ENCOUNTER — Encounter: Attending: Surgery | Admitting: Dietician

## 2024-05-18 ENCOUNTER — Encounter: Payer: Self-pay | Admitting: Dietician

## 2024-05-18 VITALS — Ht 61.0 in | Wt 293.2 lb

## 2024-05-18 DIAGNOSIS — E669 Obesity, unspecified: Secondary | ICD-10-CM | POA: Diagnosis not present

## 2024-05-18 NOTE — Progress Notes (Signed)
 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 No Bowel Habits: Every day to every other day no complaints   Body Composition Scale 09/29/2023 11/18/2023 02/16/2024 05/18/2024  Current Body Weight 293.3 292.3 290.3 293.2  Total Body Fat % 52.3 52.2 52.1 52.3  Visceral Fat 28 28 28 28   Fat-Free Mass % 47.6 47.7 47.8 47.6   Total Body Water  % 38.3 38.3 38.4 38.3  Muscle-Mass lbs 27.2 27.2 27.1 27.2  BMI 55.7 55.5 55.1 55.7  Body Fat Displacement             Torso  lbs 95.2 94.8 93.9 95.2         Left Leg  lbs 19.0 18.9 18.7 19.0         Right Leg  lbs 19.0 18.9 18.7 19.0         Left Arm  lbs 9.5 9.4 9.3 9.5         Right Arm  lbs 9.5 9.4 9.3 9.5    Lifestyle & Dietary Hx  Pt states she is not prediabetic anymore, stating she was taken off of metformin , and her A1c is now 5.4 Pt states she has had some things going, stating she has had 3 funerals to go to recently, stating she could only go to one. Pt states she has another funeral this Sunday. Pt states she has had house repairs as well. Pt states she has not been to the gym much, stating she is getting back to it.  Estimated daily fluid intake: 64+ oz Estimated daily protein intake: at least 70-75 g (need aim for 80-100 grams per day) Supplements: multivitamin (with extra ADEK) and calcium (4x per day), separate iron supplement; pt states she is also taking Emergan-C, stating that keeps  her from getting sick, sea moss, tumeric Current average weekly physical activity: ADLs; started going back to the gym  24-Hr Dietary Recall First Meal: one egg (scrambled) Snack: fruit (pear or apple without skin) or sf jello or sf pudding with protein powder, protein shake Second Meal: vegetable and a meat (chicken, lima bean, corn) Snack:  peanuts or thin triscuit Third Meal: meat and vegetable Snack: dark chocolate (2-3 Hershey kisses) Beverages: water ,    NUTRITION DIAGNOSIS  Overweight/obesity ([place]-3.3) related to past poor dietary habits and physical inactivity as evidenced by completed bariatric surgery and following dietary guidelines for continued weight loss and healthy nutrition status.   NUTRITION INTERVENTION Nutrition counseling (C-1) and education (E-2) to facilitate bariatric surgery goals, including: The importance of consuming adequate calories as well as certain nutrients daily due to the body's need for essential vitamins, minerals, and fats The importance of daily physical activity and to reach a goal of at least 150 minutes of moderate to vigorous physical activity weekly (or as directed by their physician) due to benefits such as increased musculature and improved lab values The importance of intuitive eating specifically learning hunger-satiety cues and understanding the importance of learning a new body: The importance of mindful eating to avoid grazing behaviors  Tracking your protein  intake is extremely important after bariatric surgery.   After bariatric duodenal switch surgery, prioritizing protein intake is crucial for recovery and long-term health. The surgical procedure alters the digestive system, leading to malabsorption, which can make it difficult to absorb adequate nutrients, especially protein. Aiming for 80-100 grams of protein daily, with protein included in every meal and snack, helps to preserve muscle mass, support wound healing, and prevent hair loss.  Protein is also essential for maintaining a healthy metabolism and feeling full, which aids in weight management. A focus on high-quality protein sources is vital to meet these increased nutritional needs and ensure a successful recovery and sustainable healthy lifestyle.  The bariatric plate method is a visual guide for portioning your meals. Here's a breakdown of the typical recommendations, though it's important to always follow the specific guidance from your own bariatric dietitian or healthcare team.  Protein is Priority: This is the most significant change from the standard MyPlate. Protein is essential for healing, preserving muscle mass, and helping you feel full longer. It should make up about half of your small plate.  Non-Starchy Vegetables: These are nutrient-dense and high in fiber, which is important for digestive health. They should fill about 33% of your plate.  Starchy Carbohydrates and Fruits: These should be the smallest portion of your meal, typically making up about 17% of your plate. The focus is on complex carbohydrates and whole fruits.  Goals Continue: track protein; aim for 80-100 grams per day; have a protein with every meal or snack Continue: track fluids; mostly plain water  Re-engage: increasing [facility] 60 minutes (pool or recumbent bike and weights)  Handouts Provided Include    Learning Style & Readiness for Change Teaching method utilized: Visual & Auditory  Demonstrated degree of understanding via: Teach Back  Readiness Level: preparation Barriers to learning/adherence to lifestyle change: nothing identified  RD's Notes for Next Visit Assess adherence to pt chosen goals  MONITORING & EVALUATION Dietary intake, weekly physical activity, body weight.  Next Steps Patient is to follow-up in 4 months for follow-up.

## 2024-06-08 ENCOUNTER — Other Ambulatory Visit: Payer: Self-pay | Admitting: Podiatry

## 2024-06-08 ENCOUNTER — Other Ambulatory Visit: Payer: Self-pay | Admitting: Emergency Medicine

## 2024-06-08 DIAGNOSIS — B351 Tinea unguium: Secondary | ICD-10-CM | POA: Diagnosis not present

## 2024-06-09 ENCOUNTER — Ambulatory Visit: Payer: Self-pay | Admitting: Podiatry

## 2024-06-09 LAB — HEPATIC FUNCTION PANEL
ALT: 32 IU/L (ref 0–32)
AST: 26 IU/L (ref 0–40)
Albumin: 4.2 g/dL (ref 3.9–4.9)
Alkaline Phosphatase: 156 IU/L — ABNORMAL HIGH (ref 44–121)
Bilirubin Total: 0.2 mg/dL (ref 0.0–1.2)
Bilirubin, Direct: <0.08 mg/dL (ref 0.00–0.40)
Total Protein: 7.2 g/dL (ref 6.0–8.5)

## 2024-07-29 DIAGNOSIS — Z6841 Body Mass Index (BMI) 40.0 and over, adult: Secondary | ICD-10-CM | POA: Diagnosis not present

## 2024-07-29 DIAGNOSIS — Z01419 Encounter for gynecological examination (general) (routine) without abnormal findings: Secondary | ICD-10-CM | POA: Diagnosis not present

## 2024-07-29 DIAGNOSIS — Z1231 Encounter for screening mammogram for malignant neoplasm of breast: Secondary | ICD-10-CM | POA: Diagnosis not present

## 2024-08-09 ENCOUNTER — Encounter: Payer: Self-pay | Admitting: Radiology

## 2024-08-23 ENCOUNTER — Ambulatory Visit: Admitting: Podiatry

## 2024-09-01 ENCOUNTER — Encounter: Payer: Self-pay | Admitting: Podiatry

## 2024-09-01 ENCOUNTER — Ambulatory Visit: Admitting: Podiatry

## 2024-09-01 VITALS — Ht 61.0 in | Wt 293.0 lb

## 2024-09-01 DIAGNOSIS — B351 Tinea unguium: Secondary | ICD-10-CM

## 2024-09-06 ENCOUNTER — Encounter: Payer: Self-pay | Admitting: Dietician

## 2024-09-06 ENCOUNTER — Encounter: Attending: Surgery | Admitting: Dietician

## 2024-09-06 VITALS — BP 156/107 | HR 77 | Ht 61.0 in | Wt 299.7 lb

## 2024-09-06 DIAGNOSIS — E669 Obesity, unspecified: Secondary | ICD-10-CM | POA: Insufficient documentation

## 2024-09-06 NOTE — Progress Notes (Signed)
 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 No Bowel Habits: Every day to every other day no complaints   Body Composition Scale 09/29/2023 11/18/2023 02/16/2024 05/18/2024 09/06/2024  Current Body Weight 293.3 292.3 290.3 293.2 299.7  Total Body Fat % 52.3 52.2 52.1 52.3 52.6  Visceral Fat 28 28 28 28 29   Fat-Free Mass % 47.6 47.7 47.8 47.6 47.3   Total Body Water  % 38.3 38.3 38.4 38.3 38.1  Muscle-Mass lbs 27.2 27.2 27.1 27.2 27.2  BMI 55.7 55.5 55.1 55.7 56.9  Body Fat Displacement              Torso  lbs 95.2 94.8 93.9 95.2 97.9         Left Leg  lbs 19.0 18.9 18.7 19.0 19.5         Right Leg  lbs 19.0 18.9 18.7 19.0 19.5         Left Arm  lbs 9.5 9.4 9.3 9.5 9.7         Right Arm  lbs 9.5 9.4 9.3 9.5 9.7    Lifestyle & Dietary Hx  Pt states she has been going through some stuff the past couple of months. Pt states she has been stressing bad, stating she lost her little dog and had to be put down. Pt states her car had been giving her trouble. Pt states she is trying to get back on track. Pt states she is going on a cruise this month, stating she is excited about that. She states she is going with her niece. Pt states she sees her surgeon when she gets back. Pt states she has not been tracking her protein intake. Pt states she started BELT, but the timing was not right for her schedule. Pt states she started back at the [facility] in the  pool.  Estimated daily fluid intake: 64+ oz Estimated daily protein intake: at least 70-75 g (need aim for 80-100 grams per day) Supplements: multivitamin (with extra ADEK) and calcium (4x per day), separate iron supplement; pt states she is also taking Emergan-C, stating that keeps her from getting sick, sea moss, tumeric Current average weekly physical activity: ADLs; started going back to the gym some, stopped before that due to a hemorrhoid.  24-Hr Dietary Recall First Meal: one egg (scrambled) Snack: fruit (pear or apple without skin) or sf jello or sf pudding with protein powder, protein shake Second Meal: vegetable and a meat (chicken, lima bean, corn) Snack:  peanuts or thin triscuit Third Meal: meat and vegetable Snack: dark chocolate (2-3 Hershey kisses) Beverages: water , water  with flavorings, Emergan-C in water    NUTRITION DIAGNOSIS  Overweight/obesity (Brandon-3.3) related to past poor dietary habits and physical inactivity as evidenced by completed bariatric surgery and following dietary guidelines for continued weight loss and healthy nutrition status.   NUTRITION INTERVENTION Nutrition counseling (C-1) and education (E-2) to facilitate bariatric surgery goals, including:  After SADI-S bariatric surgery, it's essential to prioritize protein to support muscle maintenance, and long-term health. Aim for 80-100 grams of protein daily, and track  your intake to ensure you consistently meet this goal. Include a protein source at every meal or snack--such as lean meats, eggs, dairy, beans, or protein shakes--to spread intake evenly throughout the day. By focusing on protein first, you'll maximize nutrient absorption, maintain energy, and reduce the risk of deficiencies that can occur with this surgery. Increase physical activity for long-term success. Do something you like, for example the [employer]. You can mix activities like the pool (gentle on joints and excellent for endurance), the  recumbent bike (low-impact cardio), and weights (to maintain muscle mass and support metabolism). This balanced approach combines cardiovascular fitness with strength training, helping you burn calories efficiently, preserve lean tissue, and build stamina. Consistency is key--making these sessions part of your weekly schedule ensures you stay active, even as you approach or maintain your goal weight.  Goals Re-engage: track protein; aim for 80-100 grams per day; have a protein with every meal or snack Continue: track fluids; mostly plain water  Re-engage: increasing physical activity[facility], 60 minutes (pool or recumbent bike and weights)  Handouts Provided Include  Bariatric MyPlate (additional copy)  Learning Style & Readiness for Change Teaching method utilized: Visual & Auditory  Demonstrated degree of understanding via: Teach Back  Readiness Level: preparation Barriers to learning/adherence to lifestyle change: nothing identified  RD's Notes for Next Visit Assess adherence to pt chosen goals  MONITORING & EVALUATION Dietary intake, weekly physical activity, body weight.  Next Steps Patient is to follow-up in 3 months for follow-up.

## 2024-09-12 NOTE — Progress Notes (Signed)
 Chief Complaint  Patient presents with   Nail Problem    Pt is here to f/u toenail fungus she states, the toenails are growing out normal.    Subjective: 63 y.o. female presenting today for follow-up evaluation of toenail fungus bilateral.  She has noticed thick discoloration brittleness to the toenails for 20+ years.  Since last visit she says that she has noted significant improvement.  She believes the toenails are growing out normally Past Medical History:  Diagnosis Date   Arthritis    Asthma    Bronchitis    Constipation    Depression    Esophagitis    GERD (gastroesophageal reflux disease)    Hypertension    Joint pain    Lactose intolerance    Obesity    PONV (postoperative nausea and vomiting)    nausea with the LEFT ankle surgery   Pre-diabetes    SOB (shortness of breath)     Past Surgical History:  Procedure Laterality Date   ANKLE ARTHROSCOPY Left    left 2014/ right 2016   ANKLE ARTHROSCOPY Right 01/11/2015   Procedure: RIGHT ANKLE ARTHROSCOPY;  Surgeon: Jerona Harden GAILS, MD;  Location: [facility];  Service: Orthopedics;  Laterality: Right;   BIOPSY  08/06/2022   Procedure: BIOPSY;  Surgeon: Dianna Specking, MD;  Location: [facility];  Service: Gastroenterology;;   BIOPSY  07/18/2023   Procedure: BIOPSY;  Surgeon: Lyndel Deward PARAS, MD;  Location: [facility];  Service: General;;   BIOPSY BREAST Right    COLONOSCOPY WITH PROPOFOL  N/A 08/06/2022   Procedure: COLONOSCOPY WITH PROPOFOL ;  Surgeon: Dianna Specking, MD;  Location: [facility];  Service: Gastroenterology;  Laterality: N/A;   ESOPHAGOGASTRODUODENOSCOPY N/A 07/18/2023   Procedure: ESOPHAGOGASTRODUODENOSCOPY (EGD);  Surgeon: Lyndel Deward PARAS, MD;  Location: [facility];  Service: General;  Laterality: N/A;   HYSTEROSCOPY WITH D & C N/A 09/05/2016   Procedure: DILATATION AND CURETTAGE /HYSTEROSCOPY;  Surgeon: Charlie Croak, MD;  Location: [facility];  Service: Gynecology;  Laterality: N/A;    LAPAROSCOPIC GASTRIC SLEEVE RESECTION N/A 07/24/2020   Procedure: LAPAROSCOPIC GASTRIC SLEEVE RESECTION; REPAIR OF HIATAL HERNIA;  Surgeon: Ethyl Lenis, MD;  Location: [facility];  Service: General;  Laterality: N/A;   POLYPECTOMY  08/06/2022   Procedure: POLYPECTOMY;  Surgeon: Dianna Specking, MD;  Location: [facility];  Service: Gastroenterology;;   TOTAL KNEE ARTHROPLASTY Right 04/16/2017   Procedure: RIGHT TOTAL KNEE ARTHROPLASTY;  Surgeon: Harden Jerona GAILS, MD;  Location: [facility];  Service: Orthopedics;  Laterality: Right;   UPPER GI ENDOSCOPY N/A 07/24/2020   Procedure: UPPER GI ENDOSCOPY;  Surgeon: Ethyl Lenis, MD;  Location: [facility];  Service: General;  Laterality: N/A;   UPPER GI ENDOSCOPY N/A 09/16/2023   Procedure: UPPER GI ENDOSCOPY;  Surgeon: Lyndel Deward PARAS, MD;  Location: [facility];  Service: General;  Laterality: N/A;    Allergies  Allergen Reactions   Latex Itching   Other Hives    Dermabond - welts around surgical sites     LT hallux nail plate 4/83/7974  Objective: Physical Exam General: The patient is alert and oriented x3 in no acute distress.  Dermatology: Hyperkeratotic, discolored, thickened, onychodystrophy noted only to the distal aspect of the nails.  The base of the nails appear to be significantly improved. Skin is warm, dry and supple bilateral lower extremities. Negative for open lesions or macerations.  Vascular: Palpable pedal pulses bilaterally. No edema or erythema noted. Capillary refill within normal limits.  Neurological: Grossly intact via light  touch  Musculoskeletal Exam: Chronic severe DJD bilateral ankles  Assessment: #1 Onychomycosis of toenails bilateral; improved  Plan of Care:  -Patient was evaluated. - Today the nails appear to be growing out healthy with the bases of the nails significantly improved -Recommend good foot hygiene and continue care -Return to clinic PRN  *Sewer for Neena Thresa EMERSON Janit, DPM [facility]  Dr. Thresa EMERSON Janit, DPM    [address] [place], [place] [address]                Office [phone]  Fax [phone]

## 2024-09-22 DIAGNOSIS — R7303 Prediabetes: Secondary | ICD-10-CM | POA: Diagnosis not present

## 2024-09-22 DIAGNOSIS — I10 Essential (primary) hypertension: Secondary | ICD-10-CM | POA: Diagnosis not present

## 2024-09-22 DIAGNOSIS — Z6841 Body Mass Index (BMI) 40.0 and over, adult: Secondary | ICD-10-CM | POA: Diagnosis not present

## 2024-09-22 DIAGNOSIS — Z23 Encounter for immunization: Secondary | ICD-10-CM | POA: Diagnosis not present

## 2024-09-22 DIAGNOSIS — K9089 Other intestinal malabsorption: Secondary | ICD-10-CM | POA: Diagnosis not present

## 2024-09-22 DIAGNOSIS — Z79899 Other long term (current) drug therapy: Secondary | ICD-10-CM | POA: Diagnosis not present

## 2024-09-22 DIAGNOSIS — Z0000 Encounter for general adult medical examination without abnormal findings: Secondary | ICD-10-CM | POA: Diagnosis not present

## 2024-09-22 DIAGNOSIS — E7800 Pure hypercholesterolemia, unspecified: Secondary | ICD-10-CM | POA: Diagnosis not present

## 2024-09-23 ENCOUNTER — Other Ambulatory Visit: Payer: Self-pay | Admitting: Internal Medicine

## 2024-09-23 DIAGNOSIS — Z87891 Personal history of nicotine dependence: Secondary | ICD-10-CM

## 2024-09-24 DIAGNOSIS — R7303 Prediabetes: Secondary | ICD-10-CM | POA: Diagnosis not present

## 2024-09-24 DIAGNOSIS — Z9884 Bariatric surgery status: Secondary | ICD-10-CM | POA: Diagnosis not present

## 2024-09-24 DIAGNOSIS — Z79899 Other long term (current) drug therapy: Secondary | ICD-10-CM | POA: Diagnosis not present

## 2024-09-24 DIAGNOSIS — Z8639 Personal history of other endocrine, nutritional and metabolic disease: Secondary | ICD-10-CM | POA: Diagnosis not present

## 2024-10-01 ENCOUNTER — Encounter: Payer: Self-pay | Admitting: Internal Medicine

## 2024-10-05 ENCOUNTER — Ambulatory Visit
Admission: RE | Admit: 2024-10-05 | Discharge: 2024-10-05 | Disposition: A | Discharge status: Home / Self Care | Source: Ambulatory Visit | Attending: Internal Medicine | Admitting: Internal Medicine

## 2024-10-05 DIAGNOSIS — Z87891 Personal history of nicotine dependence: Secondary | ICD-10-CM

## 2024-12-09 ENCOUNTER — Encounter: Admitting: Dietician
